# Patient Record
Sex: Female | Born: 1945
Health system: Southern US, Community
[De-identification: ages and names within clinical notes are randomized; demographics above are authoritative.]

## PROBLEM LIST (undated history)

## (undated) DIAGNOSIS — I10 Essential (primary) hypertension: Secondary | ICD-10-CM

## (undated) DIAGNOSIS — I2510 Atherosclerotic heart disease of native coronary artery without angina pectoris: Secondary | ICD-10-CM

---

## 2004-09-26 LAB — CONVERTED CEMR LAB: Pap Smear: NEGATIVE

## 2006-05-20 ENCOUNTER — Encounter: Payer: Self-pay | Admitting: Internal Medicine

## 2006-05-20 LAB — CONVERTED CEMR LAB: Pap Smear: ABNORMAL

## 2009-01-12 ENCOUNTER — Ambulatory Visit: Payer: Self-pay | Admitting: Obstetrics & Gynecology

## 2009-01-24 ENCOUNTER — Encounter: Payer: Self-pay | Admitting: Internal Medicine

## 2009-01-24 DIAGNOSIS — N814 Uterovaginal prolapse, unspecified: Secondary | ICD-10-CM | POA: Insufficient documentation

## 2009-01-24 DIAGNOSIS — R32 Unspecified urinary incontinence: Secondary | ICD-10-CM

## 2009-01-25 ENCOUNTER — Ambulatory Visit: Payer: Self-pay | Admitting: Internal Medicine

## 2009-01-25 DIAGNOSIS — E785 Hyperlipidemia, unspecified: Secondary | ICD-10-CM | POA: Insufficient documentation

## 2009-01-25 DIAGNOSIS — I10 Essential (primary) hypertension: Secondary | ICD-10-CM

## 2009-01-25 DIAGNOSIS — O009 Unspecified ectopic pregnancy without intrauterine pregnancy: Secondary | ICD-10-CM

## 2009-01-25 DIAGNOSIS — F17200 Nicotine dependence, unspecified, uncomplicated: Secondary | ICD-10-CM | POA: Insufficient documentation

## 2009-01-26 LAB — CONVERTED CEMR LAB
BUN: 13 mg/dL (ref 6–23)
CO2: 31 meq/L (ref 19–32)
Calcium: 9.7 mg/dL (ref 8.4–10.5)
GFR calc non Af Amer: 108.55 mL/min (ref >60)
Glucose, Bld: 90 mg/dL (ref 70–99)
HDL: 71.4 mg/dL (ref >39.00)
Sodium: 143 meq/L (ref 135–145)
Triglycerides: 63 mg/dL (ref 0.0–149.0)

## 2009-03-01 ENCOUNTER — Ambulatory Visit: Payer: Self-pay | Admitting: Obstetrics and Gynecology

## 2009-03-06 ENCOUNTER — Encounter: Payer: Self-pay | Admitting: Obstetrics and Gynecology

## 2009-03-06 ENCOUNTER — Inpatient Hospital Stay (HOSPITAL_COMMUNITY): Admission: RE | Admit: 2009-03-06 | Discharge: 2009-03-07 | Payer: Self-pay | Admitting: Obstetrics & Gynecology

## 2009-03-06 ENCOUNTER — Ambulatory Visit: Payer: Self-pay | Admitting: Obstetrics and Gynecology

## 2009-03-08 ENCOUNTER — Ambulatory Visit: Payer: Self-pay | Admitting: Internal Medicine

## 2009-03-22 ENCOUNTER — Ambulatory Visit: Payer: Self-pay | Admitting: Obstetrics and Gynecology

## 2009-06-28 ENCOUNTER — Ambulatory Visit: Payer: Self-pay | Admitting: Obstetrics and Gynecology

## 2009-08-02 ENCOUNTER — Ambulatory Visit: Payer: Self-pay | Admitting: Obstetrics and Gynecology

## 2009-09-01 ENCOUNTER — Ambulatory Visit: Payer: Self-pay | Admitting: Obstetrics & Gynecology

## 2009-11-03 ENCOUNTER — Ambulatory Visit: Payer: Self-pay | Admitting: Obstetrics & Gynecology

## 2009-12-02 ENCOUNTER — Emergency Department (HOSPITAL_BASED_OUTPATIENT_CLINIC_OR_DEPARTMENT_OTHER): Admission: EM | Admit: 2009-12-02 | Discharge: 2009-12-02 | Payer: Self-pay | Admitting: Emergency Medicine

## 2010-01-05 ENCOUNTER — Ambulatory Visit: Payer: Self-pay | Admitting: Obstetrics & Gynecology

## 2010-03-12 ENCOUNTER — Ambulatory Visit: Payer: Self-pay | Admitting: Obstetrics & Gynecology

## 2010-04-09 ENCOUNTER — Ambulatory Visit (HOSPITAL_COMMUNITY)
Admission: RE | Admit: 2010-04-09 | Discharge: 2010-04-09 | Payer: Self-pay | Source: Home / Self Care | Admitting: Obstetrics & Gynecology

## 2010-05-21 ENCOUNTER — Ambulatory Visit: Payer: Self-pay | Admitting: Family Medicine

## 2010-08-23 LAB — CBC
Hemoglobin: 13.3 g/dL (ref 12.0–15.0)
MCHC: 34.3 g/dL (ref 30.0–36.0)
MCHC: 33.7 g/dL (ref 30.0–36.0)
MCV: 97.8 fL (ref 78.0–100.0)
Platelets: 158 10*3/uL (ref 150–400)
RBC: 3.97 MIL/uL (ref 3.87–5.11)
RDW: 12.8 % (ref 11.5–15.5)
RDW: 13.2 % (ref 11.5–15.5)

## 2010-08-23 LAB — TYPE AND SCREEN
ABO/RH(D): B POS
Antibody Screen: NEGATIVE

## 2010-08-23 LAB — BASIC METABOLIC PANEL
CO2: 25 mEq/L (ref 19–32)
Calcium: 9.3 mg/dL (ref 8.4–10.5)
Creatinine, Ser: 0.69 mg/dL (ref 0.4–1.2)
GFR calc Af Amer: >60 mL/min (ref >60)
GFR calc non Af Amer: >60 mL/min (ref >60)
Glucose, Bld: 96 mg/dL (ref 70–99)

## 2010-10-02 NOTE — Group Therapy Note (Signed)
Ann Bernard, Ann Bernard NO.:  [id number]   MEDICAL RECORD NO.:  [id number]          PATIENT TYPE:  WOC   LOCATION:  [facility]                   FACILITY:  [facility]   PHYSICIAN:  Catalina Antigua, MD     DATE OF BIRTH:  09/04/1947   DATE OF SERVICE:  01/12/2009                                  CLINIC NOTE   ADDENDUM   Generally, clear ultrasound.  As stated previously, the patient had  grade 2 uterine prolapse approximately 6 weeks size uterus.  No palpable  adnexal masses or tenderness.           ______________________________  Catalina Antigua, MD     PC/MEDQ  D:  01/12/2009  T:  01/13/2009  Job:  [id number]

## 2010-10-02 NOTE — Group Therapy Note (Signed)
NAMESANJUANA, [name] NO.:  [id number]   MEDICAL RECORD NO.:  [id number]          PATIENT TYPE:  WOC   LOCATION:  [facility]                   FACILITY:  [facility]   PHYSICIAN:  Catalina Antigua, MD     DATE OF BIRTH:  09/04/1947   DATE OF SERVICE:  01/12/2009                                  CLINIC NOTE   This is a 65 year old para 2-0-1-2, menopausal since 1996, presenting  for evaluation of uterine prolapse.  The patient was referred from  [facility].  The patient reports experiencing  prolapse since May, which has gotten progressively worse.  The patient  also reports experiencing incontinence when she coughs or laughs or  exerts pressure secondary to the uterine prolapse.  At times, the  patient needs to return the uterus in to its anatomical position to  allow for urination.  The patient denies any pain or abnormal bleeding  or discharge.  The patient reports occasional pelvic pressure.   PAST MEDICAL HISTORY:  The patient denies any medical problems.   PAST SURGICAL HISTORY:  In 1996, history of an ectopic pregnancy and the  patient reports removal of left tube and left breast biopsy, which was  benign.   FAMILY HISTORY:  Significant for high blood pressure in her parents.  Otherwise, no history of GYN malignancy.   SOCIAL HISTORY:  The patient denies use of illicit drugs or alcohol.  The patient reports smoking half a pack per day for at least 20 years.   ALLERGIES TO MEDICATION:  ASPIRIN.  No latex allergy.   PHYSICAL EXAMINATION:  VITAL SIGNS:  Her weight is 177 pounds, height of  5 feet 7 inches, her blood pressure is 146/94, and pulse is 72.  BREASTS:  Unremarkable.  Both breasts were symmetric in size.  No skin  dimpling.  No abnormal discharge.  No palpable mass.  ABDOMEN:  Soft, nontender, and nondistended.  GENITOURINARY:  No abnormal discharge or bleeding, and stage II prolapse  was appreciated on exam.   The patient  reports history of abnormal Pap smear with her last Pap  being in 2008 and her last mammogram was in July 2009 was found to be  normal.  The patient desires permanent treatment of her uterine  prolapse.  The patient was given the option of conservative treatment  with pessaries, but declines and requesting surgical intervention.  The  patient was counseled regarding a transvaginal hysterectomy and is in  agreement for that.  Risks and benefits of the procedures were reviewed.  The patient verbalized understanding.  In view of an elevated blood  pressure in the  office of 146/94, the patient is to get medical clearance prior to  scheduling of the surgery, the patient is to also obtain records of her  [facility].           ______________________________  Catalina Antigua, MD     PC/MEDQ  D:  01/12/2009  T:  01/13/2009  Job:  [id number]

## 2011-03-14 ENCOUNTER — Other Ambulatory Visit: Payer: Self-pay | Admitting: Obstetrics & Gynecology

## 2011-03-14 DIAGNOSIS — Z1231 Encounter for screening mammogram for malignant neoplasm of breast: Secondary | ICD-10-CM

## 2011-04-12 ENCOUNTER — Ambulatory Visit (HOSPITAL_BASED_OUTPATIENT_CLINIC_OR_DEPARTMENT_OTHER): Payer: Self-pay

## 2012-06-03 ENCOUNTER — Encounter: Payer: Self-pay | Admitting: *Deleted

## 2015-05-07 ENCOUNTER — Encounter (HOSPITAL_BASED_OUTPATIENT_CLINIC_OR_DEPARTMENT_OTHER): Payer: Self-pay | Admitting: Emergency Medicine

## 2015-05-07 ENCOUNTER — Emergency Department (HOSPITAL_BASED_OUTPATIENT_CLINIC_OR_DEPARTMENT_OTHER)
Admission: EM | Admit: 2015-05-07 | Discharge: 2015-05-07 | Disposition: A | Discharge status: Home / Self Care | Payer: Medicare HMO | Attending: Emergency Medicine | Admitting: Emergency Medicine

## 2015-05-07 ENCOUNTER — Emergency Department (HOSPITAL_BASED_OUTPATIENT_CLINIC_OR_DEPARTMENT_OTHER): Payer: Medicare HMO

## 2015-05-07 DIAGNOSIS — R079 Chest pain, unspecified: Secondary | ICD-10-CM | POA: Diagnosis present

## 2015-05-07 DIAGNOSIS — I2510 Atherosclerotic heart disease of native coronary artery without angina pectoris: Secondary | ICD-10-CM | POA: Diagnosis not present

## 2015-05-07 DIAGNOSIS — K219 Gastro-esophageal reflux disease without esophagitis: Secondary | ICD-10-CM | POA: Insufficient documentation

## 2015-05-07 DIAGNOSIS — Z79899 Other long term (current) drug therapy: Secondary | ICD-10-CM | POA: Insufficient documentation

## 2015-05-07 DIAGNOSIS — I10 Essential (primary) hypertension: Secondary | ICD-10-CM | POA: Insufficient documentation

## 2015-05-07 HISTORY — DX: Atherosclerotic heart disease of native coronary artery without angina pectoris: I25.10

## 2015-05-07 HISTORY — DX: Essential (primary) hypertension: I10

## 2015-05-07 LAB — CBC WITH DIFFERENTIAL/PLATELET
BASOS PCT: 0 %
Basophils Absolute: 0 10*3/uL (ref 0.0–0.1)
EOS ABS: 0.1 10*3/uL (ref 0.0–0.7)
Eosinophils Relative: 2 %
HCT: 44 % (ref 36.0–46.0)
HEMOGLOBIN: 14.6 g/dL (ref 12.0–15.0)
Lymphocytes Relative: 33 %
Lymphs Abs: 2.3 10*3/uL (ref 0.7–4.0)
MCH: 31.4 pg (ref 26.0–34.0)
MCHC: 33.2 g/dL (ref 30.0–36.0)
MCV: 94.6 fL (ref 78.0–100.0)
MONOS PCT: 9 %
Monocytes Absolute: 0.6 10*3/uL (ref 0.1–1.0)
NEUTROS PCT: 56 %
Neutro Abs: 3.8 10*3/uL (ref 1.7–7.7)
PLATELETS: 159 10*3/uL (ref 150–400)
RBC: 4.65 MIL/uL (ref 3.87–5.11)
RDW: 12.8 % (ref 11.5–15.5)
WBC: 6.8 10*3/uL (ref 4.0–10.5)

## 2015-05-07 LAB — BASIC METABOLIC PANEL
Anion gap: 6 (ref 5–15)
BUN: 13 mg/dL (ref 6–20)
CALCIUM: 9.1 mg/dL (ref 8.9–10.3)
CO2: 27 mmol/L (ref 22–32)
CREATININE: 0.62 mg/dL (ref 0.44–1.00)
Chloride: 107 mmol/L (ref 101–111)
GFR calc non Af Amer: >60 mL/min (ref >60)
Glucose, Bld: 108 mg/dL — ABNORMAL HIGH (ref 65–99)
Potassium: 3.7 mmol/L (ref 3.5–5.1)
SODIUM: 140 mmol/L (ref 135–145)

## 2015-05-07 LAB — TROPONIN I

## 2015-05-07 MED ORDER — RANITIDINE HCL 150 MG PO CAPS: 150.0000 mg | ORAL_CAPSULE | Freq: Two times a day (BID) | ORAL | Status: DC

## 2015-05-07 NOTE — Discharge Instructions (Signed)
Gastroesophageal Reflux Disease, Adult Normally, food travels down the esophagus and stays in the stomach to be digested. If a person has gastroesophageal reflux disease (GERD), food and stomach acid move back up into the esophagus. When this happens, the esophagus becomes sore and swollen (inflamed). Over time, GERD can make small holes (ulcers) in the lining of the esophagus. HOME CARE Diet  Follow a diet as told by your doctor. You may need to avoid foods and drinks such as:  Coffee and tea (with or without caffeine).  Drinks that contain alcohol.  Energy drinks and sports drinks.  Carbonated drinks or sodas.  Chocolate and cocoa.  Peppermint and mint flavorings.  Garlic and onions.  Horseradish.  Spicy and acidic foods, such as peppers, chili powder, curry powder, vinegar, hot sauces, and BBQ sauce.  Citrus fruit juices and citrus fruits, such as oranges, lemons, and limes.  Tomato-based foods, such as red sauce, chili, salsa, and pizza with red sauce.  Fried and fatty foods, such as donuts, french fries, potato chips, and high-fat dressings.  High-fat meats, such as hot dogs, rib eye steak, sausage, ham, and bacon.  High-fat dairy items, such as whole milk, butter, and cream cheese.  Eat small meals often. Avoid eating large meals.  Avoid drinking large amounts of liquid with your meals.  Avoid eating meals during the 2-3 hours before bedtime.  Avoid lying down right after you eat.  Do not exercise right after you eat. General Instructions  Pay attention to any changes in your symptoms.  Take over-the-counter and prescription medicines only as told by your doctor. Do not take aspirin, ibuprofen, or other NSAIDs unless your doctor says it is okay.  Do not use any tobacco products, including cigarettes, chewing tobacco, and e-cigarettes. If you need help quitting, ask your doctor.  Wear loose clothes. Do not wear anything tight around your waist.  Raise  (elevate) the head of your bed about 6 inches (15 cm).  Try to lower your stress. If you need help doing this, ask your doctor.  If you are overweight, lose an amount of weight that is healthy for you. Ask your doctor about a safe weight loss goal.  Keep all follow-up visits as told by your doctor. This is important. GET HELP IF:  You have new symptoms.  You lose weight and you do not know why it is happening.  You have trouble swallowing, or it hurts to swallow.  You have wheezing or a cough that keeps happening.  Your symptoms do not get better with treatment.  You have a hoarse voice. GET HELP RIGHT AWAY IF:  You have pain in your arms, neck, jaw, teeth, or back.  You feel sweaty, dizzy, or light-headed.  You have chest pain or shortness of breath.  You throw up (vomit) and your throw up looks like blood or coffee grounds.  You pass out (faint).  Your poop (stool) is bloody or black.  You cannot swallow, drink, or eat.   This information is not intended to replace advice given to you by your health care provider. Make sure you discuss any questions you have with your health care provider.   Document Released: 10/23/2007 Document Revised: 01/25/2015 Document Reviewed: 08/31/2014 Elsevier Interactive Patient Education 2016 [employer]  -   

## 2015-05-07 NOTE — ED Notes (Signed)
Reports feeling like she had indigestion yesterday am, states that it felt like something was stuck in her throat, this am feeling had resoled, denies n/v, sob or radiation of pain

## 2015-05-07 NOTE — ED Provider Notes (Signed)
CSN: [id number]     Arrival date & time 05/07/15  0902 History   First MD Initiated Contact with Patient 05/07/15 0911     Chief Complaint  Patient presents with  . Chest Pain      HPI Patient presents with epigastric discomfort which started yesterday.  She took antacids and woke up this morning with discomfort gone.  She has no discomfort now.  She has no cardiac history.  Denies diaphoresis, nausea, vomiting.  Has history of hypertension. Past Medical History  Diagnosis Date  . Hypertension   . Coronary artery disease    History reviewed. No pertinent past surgical history. History reviewed. No pertinent family history. Social History  Substance Use Topics  . Smoking status: Never Smoker   . Smokeless tobacco: None  . Alcohol Use: No   OB History    No data available     Review of Systems   All other systems reviewed and are negative Allergies  Aspirin  Home Medications   Prior to Admission medications   Medication Sig Start Date End Date Taking? Authorizing Provider  losartan (COZAAR) 100 MG tablet Take 100 mg by mouth daily.   Yes Historical Provider, MD  pravastatin (PRAVACHOL) 40 MG tablet Take 40 mg by mouth daily.   Yes Historical Provider, MD  ranitidine (ZANTAC) 150 MG capsule Take 1 capsule (150 mg total) by mouth 2 (two) times daily. 05/07/15   Nelva Nay, MD   BP 135/97 mmHg  Pulse 64  Temp(Src) 98.2 F (36.8 C) (Oral)  Resp 11  Wt 192 lb (87.091 kg)  SpO2 100% Physical Exam Physical Exam  Nursing note and vitals reviewed. Constitutional: She is oriented to person, place, and time. She appears well-developed and well-nourished. No distress.  HENT:  Head: Normocephalic and atraumatic.  Eyes: Pupils are equal, round, and reactive to light.  Neck: Normal range of motion.  Cardiovascular: Normal rate and intact distal pulses.   Pulmonary/Chest: No respiratory distress.  Abdominal: Normal appearance. She exhibits no distension.   Musculoskeletal: Normal range of motion.  Neurological: She is alert and oriented to person, place, and time. No cranial nerve deficit.  Skin: Skin is warm and dry. No rash noted.     ED Course  Procedures (including critical care time) Labs Review Labs Reviewed  BASIC METABOLIC PANEL - Abnormal; Notable for the following:    Glucose, Bld 108 (*)    All other components within normal limits  CBC WITH DIFFERENTIAL/PLATELET  TROPONIN I    Imaging Review Dg Chest Portable 1 View  05/07/2015  CLINICAL DATA:  Chest pain EXAM: PORTABLE CHEST 1 VIEW COMPARISON:  None. FINDINGS: Normal heart size. Clear lungs. No pneumothorax or pleural effusion. IMPRESSION: No active disease. Electronically Signed   By: Jolaine Click M.D.   On: 05/07/2015 09:56   I have personally reviewed and evaluated these images and lab results as part of my medical decision-making.   EKG Interpretation   Date/Time:  Sunday May 07 2015 09:12:51 EST Ventricular Rate:  72 PR Interval:  150 QRS Duration: 90 QT Interval:  383 QTC Calculation: 419 R Axis:   11 Text Interpretation:  Sinus rhythm Abnormal R-wave progression, early  transition No previous tracing Confirmed by Cuma Polyakov  MD, Frederich Montilla ([id number]) on  05/07/2015 9:17:56 AM      MDM   Final diagnoses:  Gastroesophageal reflux disease, esophagitis presence not specified        Nelva Nay, MD 05/07/15 1103

## 2019-03-14 ENCOUNTER — Emergency Department (HOSPITAL_BASED_OUTPATIENT_CLINIC_OR_DEPARTMENT_OTHER): Payer: Medicare HMO

## 2019-03-14 ENCOUNTER — Inpatient Hospital Stay (HOSPITAL_BASED_OUTPATIENT_CLINIC_OR_DEPARTMENT_OTHER)
Admission: EM | Admit: 2019-03-14 | Discharge: 2019-03-18 | DRG: 286 | Disposition: A | Discharge status: Home / Self Care | Payer: Medicare HMO | Attending: Internal Medicine | Admitting: Internal Medicine

## 2019-03-14 ENCOUNTER — Other Ambulatory Visit: Payer: Self-pay

## 2019-03-14 DIAGNOSIS — Z888 Allergy status to other drugs, medicaments and biological substances status: Secondary | ICD-10-CM | POA: Diagnosis not present

## 2019-03-14 DIAGNOSIS — E876 Hypokalemia: Secondary | ICD-10-CM | POA: Diagnosis present

## 2019-03-14 DIAGNOSIS — K219 Gastro-esophageal reflux disease without esophagitis: Secondary | ICD-10-CM | POA: Diagnosis present

## 2019-03-14 DIAGNOSIS — I110 Hypertensive heart disease with heart failure: Secondary | ICD-10-CM | POA: Diagnosis present

## 2019-03-14 DIAGNOSIS — I2510 Atherosclerotic heart disease of native coronary artery without angina pectoris: Secondary | ICD-10-CM | POA: Diagnosis present

## 2019-03-14 DIAGNOSIS — I10 Essential (primary) hypertension: Secondary | ICD-10-CM | POA: Diagnosis present

## 2019-03-14 DIAGNOSIS — J9601 Acute respiratory failure with hypoxia: Secondary | ICD-10-CM | POA: Diagnosis not present

## 2019-03-14 DIAGNOSIS — I361 Nonrheumatic tricuspid (valve) insufficiency: Secondary | ICD-10-CM | POA: Diagnosis not present

## 2019-03-14 DIAGNOSIS — Z79899 Other long term (current) drug therapy: Secondary | ICD-10-CM | POA: Diagnosis not present

## 2019-03-14 DIAGNOSIS — E785 Hyperlipidemia, unspecified: Secondary | ICD-10-CM | POA: Diagnosis present

## 2019-03-14 DIAGNOSIS — Z833 Family history of diabetes mellitus: Secondary | ICD-10-CM | POA: Diagnosis not present

## 2019-03-14 DIAGNOSIS — Z23 Encounter for immunization: Secondary | ICD-10-CM

## 2019-03-14 DIAGNOSIS — Z8249 Family history of ischemic heart disease and other diseases of the circulatory system: Secondary | ICD-10-CM

## 2019-03-14 DIAGNOSIS — Z811 Family history of alcohol abuse and dependence: Secondary | ICD-10-CM | POA: Diagnosis not present

## 2019-03-14 DIAGNOSIS — Z20828 Contact with and (suspected) exposure to other viral communicable diseases: Secondary | ICD-10-CM | POA: Diagnosis present

## 2019-03-14 DIAGNOSIS — I5021 Acute systolic (congestive) heart failure: Secondary | ICD-10-CM

## 2019-03-14 DIAGNOSIS — I509 Heart failure, unspecified: Secondary | ICD-10-CM

## 2019-03-14 DIAGNOSIS — I428 Other cardiomyopathies: Secondary | ICD-10-CM | POA: Diagnosis present

## 2019-03-14 DIAGNOSIS — I272 Pulmonary hypertension, unspecified: Secondary | ICD-10-CM | POA: Diagnosis present

## 2019-03-14 DIAGNOSIS — I249 Acute ischemic heart disease, unspecified: Secondary | ICD-10-CM | POA: Diagnosis present

## 2019-03-14 DIAGNOSIS — R072 Precordial pain: Secondary | ICD-10-CM

## 2019-03-14 DIAGNOSIS — F17210 Nicotine dependence, cigarettes, uncomplicated: Secondary | ICD-10-CM | POA: Diagnosis present

## 2019-03-14 DIAGNOSIS — I340 Nonrheumatic mitral (valve) insufficiency: Secondary | ICD-10-CM | POA: Diagnosis not present

## 2019-03-14 DIAGNOSIS — I447 Left bundle-branch block, unspecified: Secondary | ICD-10-CM | POA: Diagnosis present

## 2019-03-14 LAB — COMPREHENSIVE METABOLIC PANEL
ALT: 38 U/L (ref 0–44)
AST: 24 U/L (ref 15–41)
Albumin: 4.1 g/dL (ref 3.5–5.0)
Alkaline Phosphatase: 59 U/L (ref 38–126)
Anion gap: 10 (ref 5–15)
BUN: 16 mg/dL (ref 8–23)
CO2: 23 mmol/L (ref 22–32)
Calcium: 9.3 mg/dL (ref 8.9–10.3)
Chloride: 109 mmol/L (ref 98–111)
Creatinine, Ser: 0.86 mg/dL (ref 0.44–1.00)
GFR calc Af Amer: >60 mL/min (ref >60)
GFR calc non Af Amer: >60 mL/min (ref >60)
Glucose, Bld: 124 mg/dL — ABNORMAL HIGH (ref 70–99)
Potassium: 3.7 mmol/L (ref 3.5–5.1)
Sodium: 142 mmol/L (ref 135–145)
Total Bilirubin: 0.8 mg/dL (ref 0.3–1.2)
Total Protein: 7.3 g/dL (ref 6.5–8.1)

## 2019-03-14 LAB — CBC WITH DIFFERENTIAL/PLATELET
Abs Immature Granulocytes: 0.02 10*3/uL (ref 0.00–0.07)
Basophils Absolute: 0 10*3/uL (ref 0.0–0.1)
Basophils Relative: 1 %
Eosinophils Absolute: 0.1 10*3/uL (ref 0.0–0.5)
Eosinophils Relative: 1 %
HCT: 51 % — ABNORMAL HIGH (ref 36.0–46.0)
Hemoglobin: 15.9 g/dL — ABNORMAL HIGH (ref 12.0–15.0)
Immature Granulocytes: 0 %
Lymphocytes Relative: 39 %
Lymphs Abs: 2.8 10*3/uL (ref 0.7–4.0)
MCH: 31.6 pg (ref 26.0–34.0)
MCHC: 31.2 g/dL (ref 30.0–36.0)
MCV: 101.4 fL — ABNORMAL HIGH (ref 80.0–100.0)
Monocytes Absolute: 0.6 10*3/uL (ref 0.1–1.0)
Monocytes Relative: 8 %
Neutro Abs: 3.7 10*3/uL (ref 1.7–7.7)
Neutrophils Relative %: 51 %
Platelets: 207 10*3/uL (ref 150–400)
RBC: 5.03 MIL/uL (ref 3.87–5.11)
RDW: 13.5 % (ref 11.5–15.5)
WBC: 7.2 10*3/uL (ref 4.0–10.5)
nRBC: 0 % (ref 0.0–0.2)

## 2019-03-14 LAB — SARS CORONAVIRUS 2 BY RT PCR (HOSPITAL ORDER, PERFORMED IN ~~LOC~~ HOSPITAL LAB): SARS Coronavirus 2: NEGATIVE

## 2019-03-14 LAB — D-DIMER, QUANTITATIVE: D-Dimer, Quant: 2.03 ug/mL-FEU — ABNORMAL HIGH (ref 0.00–0.50)

## 2019-03-14 LAB — TROPONIN I (HIGH SENSITIVITY)
Troponin I (High Sensitivity): 22 ng/L — ABNORMAL HIGH (ref <18)
Troponin I (High Sensitivity): 60 ng/L — ABNORMAL HIGH (ref <18)
Troponin I (High Sensitivity): 493 ng/L (ref <18)

## 2019-03-14 LAB — BRAIN NATRIURETIC PEPTIDE: B Natriuretic Peptide: 1035.7 pg/mL — ABNORMAL HIGH (ref 0.0–100.0)

## 2019-03-14 MED ORDER — SODIUM CHLORIDE 0.9 % IV SOLN
2.0000 g | INTRAVENOUS | Status: DC
  Administered 2019-03-14: 2 g via INTRAVENOUS
  Filled 2019-03-14 (×2): qty 20

## 2019-03-14 MED ORDER — HEPARIN BOLUS VIA INFUSION
6000.0000 [IU] | Freq: Once | INTRAVENOUS | Status: AC
  Administered 2019-03-14: 6000 [IU] via INTRAVENOUS

## 2019-03-14 MED ORDER — SODIUM CHLORIDE 0.9 % IV SOLN
INTRAVENOUS | Status: DC | PRN
  Administered 2019-03-14: 500 mL via INTRAVENOUS

## 2019-03-14 MED ORDER — FUROSEMIDE 10 MG/ML IJ SOLN
40.0000 mg | Freq: Once | INTRAMUSCULAR | Status: AC
  Administered 2019-03-14: 40 mg via INTRAVENOUS
  Filled 2019-03-14: qty 4

## 2019-03-14 MED ORDER — SODIUM CHLORIDE 0.9 % IV SOLN
500.0000 mg | INTRAVENOUS | Status: DC
  Administered 2019-03-14: 500 mg via INTRAVENOUS
  Filled 2019-03-14 (×2): qty 500

## 2019-03-14 MED ORDER — IOHEXOL 350 MG/ML SOLN
100.0000 mL | Freq: Once | INTRAVENOUS | Status: AC | PRN
  Administered 2019-03-14: 81 mL via INTRAVENOUS

## 2019-03-14 MED ORDER — ASPIRIN 81 MG PO CHEW
324.0000 mg | CHEWABLE_TABLET | Freq: Once | ORAL | Status: AC
  Administered 2019-03-14: 324 mg via ORAL
  Filled 2019-03-14: qty 4

## 2019-03-14 MED ORDER — HEPARIN (PORCINE) 25000 UT/250ML-% IV SOLN
1400.0000 [IU]/h | INTRAVENOUS | Status: DC
  Administered 2019-03-14: 1400 [IU]/h via INTRAVENOUS
  Filled 2019-03-14: qty 250

## 2019-03-14 NOTE — ED Notes (Signed)
Portable Xray at bedside.

## 2019-03-14 NOTE — ED Notes (Signed)
Lasix given and purewick placed. Pt repositioned to sit upright. Fan given for comfort

## 2019-03-14 NOTE — ED Notes (Signed)
ED Provider at bedside. 

## 2019-03-14 NOTE — ED Notes (Signed)
Patient transported to CT 

## 2019-03-14 NOTE — ED Notes (Signed)
CT delayed due to pt's severe dyspnea at rest. Long, MD and Evelena Peat, rad tech aware

## 2019-03-14 NOTE — Progress Notes (Signed)
ANTICOAGULATION CONSULT NOTE - Initial Consult  Pharmacy Consult for Heparin Indication: pulmonary embolus  Allergies  Allergen Reactions  . Aspirin Nausea Only    Other reaction(s): GI Upset (intolerance) Stomach cramps Stomach cramps     Patient Measurements: Height: 5\' 7"  (170.2 cm) Weight: 199 lb 15.3 oz (90.7 kg) IBW/kg (Calculated) : 61.6 Heparin Dosing Weight: 90.7 kg  Vital Signs: Temp: 98.1 F (36.7 C) (10/25 1604) Temp Source: Oral (10/25 1604) BP: 142/113 (10/25 1730) Pulse Rate: 128 (10/25 1730)  Labs: Recent Labs    03/14/19 1600  HGB 15.9*  HCT 51.0*  PLT 207  CREATININE 0.86  TROPONINIHS 22*    Estimated Creatinine Clearance: 67.3 mL/min (by C-G formula based on SCr of 0.86 mg/dL).   Medical History: Past Medical History:  Diagnosis Date  . Coronary artery disease   . Hypertension     Medications:  Infusions:  . sodium chloride 500 mL (03/14/19 1754)  . heparin 1,400 Units/hr (03/14/19 1755)    Assessment: Patient is a 36 yof with a hx of CHF whom presents to the ED with chest pain and shortness of breath. Patient is having a COPD exacerbation currently preventing her from getting a CT-PE at this time. MD wished to start heparin empirically.   Goal of Therapy:  Heparin level 0.3-0.7 units/ml Monitor platelets by anticoagulation protocol: Yes   Plan:  - Will bolus the patient with Heparin 6000 units IV x 1 dose  - Followed by Heparin 14000 units/hr  - Will check a Heparin level in 8 hours from start of infusion  - Will monitor for s/s of bleeding  Duanne Limerick PharmD. BCPS 03/14/2019,6:10 PM

## 2019-03-14 NOTE — ED Notes (Signed)
CT notified pt ready to go for scan

## 2019-03-14 NOTE — ED Notes (Signed)
Pt unable to void after lasix administered. VORB from Dr. Laverta Baltimore to I/o cath if necessary and pt requested it

## 2019-03-14 NOTE — Progress Notes (Signed)
Pt taken off BIPAP. Placed on 2LT [place]. Pt not in any distress at this time.

## 2019-03-14 NOTE — ED Notes (Signed)
Dr. Laverta Baltimore aware that pt's troponin is 493.

## 2019-03-14 NOTE — ED Provider Notes (Signed)
Emergency Department Provider Note   I have reviewed the triage vital signs and the nursing notes.   HISTORY  Chief Complaint Chest Pain   HPI Ann Bernard is a 73 y.o. female with past medical history of hypertension presents to the emergency department for evaluation of exertional chest pain now with some mild discomfort at rest along with generalized weakness, nausea, diaphoresis, shortness of breath.  Symptoms been worsening over the past week.  Patient states that initially symptoms were worse with getting up and walking around.  She have to stop and rest and symptoms would resolve.  Over the past several days that pattern has continued but she does have some mild chest discomfort at rest now as well.  She denies fevers, chills, sore throat.  Denies cough.  No sick contact.  No abdominal or back pain.  She is compliant with her medications.  CAD is listed in her medical history here but she denies prior heart attacks.  She denies any active pain or shortness of breath resting here in the emergency department.     Past Medical History:  Diagnosis Date  . Coronary artery disease   . Hypertension     Patient Active Problem List   Diagnosis Date Noted  . DYSLIPIDEMIA 01/25/2009  . TOBACCO ABUSE 01/25/2009  . HYPERTENSION 01/25/2009  . ECTOPIC PREGNANCY 01/25/2009  . UTERINE PROLAPSE 01/24/2009  . URINARY INCONTINENCE 01/24/2009    No past surgical history on file.  Allergies Aspirin  No family history on file.  Social History Social History   Tobacco Use  . Smoking status: Never Smoker  Substance Use Topics  . Alcohol use: No  . Drug use: Not on file    Review of Systems  Constitutional: No fever/chills. Positive generalized weakness.  Eyes: No visual changes. ENT: No sore throat. Cardiovascular: Positive chest pain. Respiratory: Positive shortness of breath. Gastrointestinal: No abdominal pain. Positive nausea, no vomiting.  No diarrhea.  No  constipation. Genitourinary: Negative for dysuria. Musculoskeletal: Negative for back pain. Skin: Negative for rash. Neurological: Negative for headaches, focal weakness or numbness.  10-point ROS otherwise negative.  ____________________________________________   PHYSICAL EXAM:  VITAL SIGNS: ED Triage Vitals  Enc Vitals Group     BP 03/14/19 1604 (!) 140/99     Pulse Rate 03/14/19 1604 (!) 118     Resp 03/14/19 1604 (!) 37     Temp 03/14/19 1604 98.1 F (36.7 C)     Temp Source 03/14/19 1604 Oral     SpO2 03/14/19 1604 98 %     Weight --      Height 03/14/19 1601 5\' 7"  (1.702 m)   Constitutional: Alert and oriented. Well appearing but diaphoretic on my initial evaluation.  Eyes: Conjunctivae are normal.  Head: Atraumatic. Nose: No congestion/rhinnorhea. Mouth/Throat: Mucous membranes are moist.  Neck: No stridor.   Cardiovascular: Tachycardia. Good peripheral circulation. Grossly normal heart sounds.   Respiratory: Slight increased respiratory effort.  No retractions. Lungs CTAB. Gastrointestinal: Soft and nontender. No distention.  Musculoskeletal: No lower extremity tenderness nor edema. No gross deformities of extremities. Neurologic:  Normal speech and language.  Skin:  Skin is warm, dry and intact. No rash noted.   ____________________________________________   LABS (all labs ordered are listed, but only abnormal results are displayed)  Labs Reviewed  COMPREHENSIVE METABOLIC PANEL - Abnormal; Notable for the following components:      Result Value   Glucose, Bld 124 (*)    All other components within  normal limits  BRAIN NATRIURETIC PEPTIDE - Abnormal; Notable for the following components:   B Natriuretic Peptide 1,035.7 (*)    All other components within normal limits  CBC WITH DIFFERENTIAL/PLATELET - Abnormal; Notable for the following components:   Hemoglobin 15.9 (*)    HCT 51.0 (*)    MCV 101.4 (*)    All other components within normal limits   D-DIMER, QUANTITATIVE (NOT AT [facility]) - Abnormal; Notable for the following components:   D-Dimer, Quant 2.03 (*)    All other components within normal limits  TROPONIN I (HIGH SENSITIVITY) - Abnormal; Notable for the following components:   Troponin I (High Sensitivity) 22 (*)    All other components within normal limits  SARS CORONAVIRUS 2 BY RT PCR (HOSPITAL ORDER, [place] [facility])  HEPARIN LEVEL (UNFRACTIONATED)  TROPONIN I (HIGH SENSITIVITY)   ____________________________________________  EKG   EKG Interpretation  Date/Time:  Sunday March 14 2019 16:02:29 EDT Ventricular Rate:  126 PR Interval:    QRS Duration: 149 QT Interval:  360 QTC Calculation: 522 R Axis:   60 Text Interpretation:  Fast sinus arrhythmia Left bundle branch block No STEMI Confirmed by Nanda Quinton [phone]) on 03/14/2019 4:04:33 PM       ____________________________________________  RADIOLOGY  Dg Chest Portable 1 View  Result Date: 03/14/2019 CLINICAL DATA:  Chest pain, shortness of breath EXAM: PORTABLE CHEST 1 VIEW COMPARISON:  05/07/2015 FINDINGS: Bilateral diffuse interstitial thickening. No pleural effusion or pneumothorax. Stable cardiomegaly. No acute osseous abnormality. IMPRESSION: Bilateral interstitial thickening and cardiomegaly most concerning for mild CHF versus interstitial infection. Electronically Signed   By: Kathreen Devoid   On: 03/14/2019 16:45    ____________________________________________   PROCEDURES  Procedure(s) performed:   .Critical Care Performed by: Margette Fast, MD Authorized by: Margette Fast, MD   Critical care provider statement:    Critical care time (minutes):  75   Critical care time was exclusive of:  Separately billable procedures and treating other patients and teaching time   Critical care was necessary to treat or prevent imminent or life-threatening deterioration of the following conditions:  Respiratory failure   Critical  care was time spent personally by me on the following activities:  Blood draw for specimens, development of treatment plan with patient or surrogate, discussions with consultants, evaluation of patient's response to treatment, examination of patient, obtaining history from patient or surrogate, ordering and performing treatments and interventions, ordering and review of laboratory studies, ordering and review of radiographic studies, pulse oximetry, re-evaluation of patient's condition and review of old charts   I assumed direction of critical care for this patient from another provider in my specialty: no       ____________________________________________   INITIAL IMPRESSION / [place] / ED COURSE  Pertinent labs & imaging results that were available during my care of the patient were reviewed by me and considered in my medical decision making (see chart for details).   Patient presents to the emergency department for evaluation of chest pain, shortness of breath, weakness, diaphoresis, nausea.  Symptoms worse with exertion but now occasional symptoms at rest.  She is tachycardic here and diaphoretic on my initial evaluation.  She denies chest pain symptoms.  Will give aspirin.  Patient has listed allergy but states it gives her some stomach upset when she does not take a baby aspirin.  No fever or clinical concern for COVID-19.  Plan for screening lab work including D-dimer with  her shortness of breath and tachycardia.  Left bundle branch block on EKG is new from our last tracing but in review of care everywhere there is an EKG from 07/22/2018 where sinus rhythm and left bundle branch block is described. I cannot visually inspect the tracing, however.   05:00 PM  Reevaluated the patient and she continues to have tachypnea which does seem somewhat worse than when she arrived.  Her lab work shows significantly elevated BNP along with slightly elevated troponin and elevated D-dimer.  Patient  would likely benefit from BiPAP and given her week of progressive worsening symptoms with no fevers I do suspect that this may be some new CHF presentation.  I am sending rapid Covid testing.  Given the elevated D-dimer I do have some concern for PE but I do not feel the patient is stable enough at this time to lie flat for CT of the chest.  Will start heparin.  Will give Lasix.   06:50 PM  COVID negative.  Patient is on BiPAP and doing significantly better.  Heart rate and respiratory rate have decreased.  Will perform CTA when patient is more stable and able to lie flat which I still do not believe is right now.  She is receiving heparin.  Continue trending her troponins.  Will call for bed placement.   07:15 PM  Discussed patient's case with [facility], Dr. Antionette Char to request admission. Patient and family (if present) updated with plan.   I reviewed all nursing notes, vitals, pertinent old records, EKGs, labs, imaging (as available).  09:00 PM  CTA reviewed which shows no PE.  Patient has suspicion for infectious process but feel that edema is much more likely.  Given the somewhat early nature of his presentation I do plan for antibiotics which can be drawn back and discontinued by the primary team of the patient continues to improve.  She was able to remove the BiPAP to go to CT and would like to keep it off for now.  Will follow very closely.  Patient allowed for ice chips.  Troponin has increased now to 60 but doubt primary ACS clinically.  Feel that given the patient's level of respiratory distress and edema that that is likely related to strain.   11:00 PM  Continue to follow troponin. No CP or significant SOB at this time. EKG unchanged. Doubt NSTEMI but will continue heparin for now while trending troponin. Patient remains on BiPAP but very comfortable.  ____________________________________________  FINAL CLINICAL IMPRESSION(S) / ED DIAGNOSES  Final diagnoses:  Acute respiratory failure with  hypoxia (HCC)  Precordial chest pain     MEDICATIONS GIVEN DURING THIS VISIT:  Medications  iohexol (OMNIPAQUE) 350 MG/ML injection 100 mL (has no administration in time range)  heparin ADULT infusion 100 units/mL (25000 units/260mL sodium chloride 0.45%) (1,400 Units/hr Intravenous Rate/Dose Verify 03/14/19 1815)  0.9 %  sodium chloride infusion (500 mLs Intravenous New Bag/Given 03/14/19 1754)  aspirin chewable tablet 324 mg (324 mg Oral Given 03/14/19 1653)  furosemide (LASIX) injection 40 mg (40 mg Intravenous Given 03/14/19 1710)  heparin bolus via infusion 6,000 Units (6,000 Units Intravenous Bolus from Bag 03/14/19 1756)    Note:  This document was prepared using Dragon voice recognition software and may include unintentional dictation errors.  Alona Bene, MD, [facility]    Koray Soter, Arlyss Repress, MD 03/15/19 [phone]

## 2019-03-14 NOTE — ED Notes (Signed)
Patient has become more SOB. Placed on 10L [place] [facility]. May need BiPAP after Covid test. Will continue to monitor.

## 2019-03-14 NOTE — ED Notes (Signed)
Son Beverely Low going home for the night, with plans to return in the morning. Phone number in demographics. Updated on wait for bed assignment.

## 2019-03-14 NOTE — ED Notes (Signed)
Pt to CT with RN. Tol well. Able to stand and move from stretcher to CT table easily. Ice chips given upon return to room 8 with verbal approval from Dr. Laverta Baltimore

## 2019-03-14 NOTE — ED Notes (Signed)
Pt having difficulty using purewick. Removed and used bed pan with more success.

## 2019-03-14 NOTE — ED Triage Notes (Signed)
Pt c/o mid sternal CP with sob and nausea intermittently since 10/15, however pt reports worsening since last night

## 2019-03-15 ENCOUNTER — Encounter (HOSPITAL_COMMUNITY): Payer: Self-pay

## 2019-03-15 DIAGNOSIS — I249 Acute ischemic heart disease, unspecified: Secondary | ICD-10-CM | POA: Diagnosis present

## 2019-03-15 DIAGNOSIS — I509 Heart failure, unspecified: Secondary | ICD-10-CM

## 2019-03-15 DIAGNOSIS — R072 Precordial pain: Secondary | ICD-10-CM

## 2019-03-15 DIAGNOSIS — J9601 Acute respiratory failure with hypoxia: Secondary | ICD-10-CM

## 2019-03-15 LAB — HEPARIN LEVEL (UNFRACTIONATED)
Heparin Unfractionated: 1.26 IU/mL — ABNORMAL HIGH (ref 0.30–0.70)
Heparin Unfractionated: 0.71 IU/mL — ABNORMAL HIGH (ref 0.30–0.70)

## 2019-03-15 LAB — TROPONIN I (HIGH SENSITIVITY): Troponin I (High Sensitivity): 478 ng/L (ref <18)

## 2019-03-15 LAB — MRSA PCR SCREENING: MRSA by PCR: NEGATIVE

## 2019-03-15 MED ORDER — ONDANSETRON HCL 4 MG/2ML IJ SOLN: 4.0000 mg | Freq: Four times a day (QID) | INTRAMUSCULAR | Status: DC | PRN

## 2019-03-15 MED ORDER — SODIUM CHLORIDE 0.9% FLUSH: 3.0000 mL | INTRAVENOUS | Status: DC | PRN

## 2019-03-15 MED ORDER — FUROSEMIDE 10 MG/ML IJ SOLN
40.0000 mg | Freq: Two times a day (BID) | INTRAMUSCULAR | Status: DC
  Administered 2019-03-15: 40 mg via INTRAVENOUS
  Filled 2019-03-15: qty 4

## 2019-03-15 MED ORDER — SODIUM CHLORIDE 0.9% FLUSH
3.0000 mL | Freq: Two times a day (BID) | INTRAVENOUS | Status: DC
  Administered 2019-03-15 – 2019-03-17 (×4): 3 mL via INTRAVENOUS

## 2019-03-15 MED ORDER — ZOLPIDEM TARTRATE 5 MG PO TABS: 5.0000 mg | ORAL_TABLET | Freq: Every evening | ORAL | Status: DC | PRN

## 2019-03-15 MED ORDER — SODIUM CHLORIDE 0.9 % IV SOLN: 250.0000 mL | INTRAVENOUS | Status: DC | PRN

## 2019-03-15 MED ORDER — NITROGLYCERIN 0.2 MG/HR TD PT24
0.2000 mg | MEDICATED_PATCH | TRANSDERMAL | Status: DC
  Administered 2019-03-15: 0.2 mg via TRANSDERMAL
  Filled 2019-03-15: qty 1

## 2019-03-15 MED ORDER — ONDANSETRON HCL 4 MG PO TABS: 4.0000 mg | ORAL_TABLET | Freq: Four times a day (QID) | ORAL | Status: DC | PRN

## 2019-03-15 MED ORDER — LOSARTAN POTASSIUM 50 MG PO TABS
100.0000 mg | ORAL_TABLET | Freq: Every day | ORAL | Status: DC
  Administered 2019-03-15: 100 mg via ORAL
  Filled 2019-03-15: qty 2

## 2019-03-15 MED ORDER — OXYCODONE HCL 5 MG PO TABS: 5.0000 mg | ORAL_TABLET | ORAL | Status: DC | PRN

## 2019-03-15 MED ORDER — PNEUMOCOCCAL VAC POLYVALENT 25 MCG/0.5ML IJ INJ
0.5000 mL | INJECTION | INTRAMUSCULAR | Status: AC
  Administered 2019-03-16: 0.5 mL via INTRAMUSCULAR
  Filled 2019-03-15: qty 0.5

## 2019-03-15 MED ORDER — FUROSEMIDE 10 MG/ML IJ SOLN: 40.0000 mg | Freq: Every day | INTRAMUSCULAR | Status: DC

## 2019-03-15 MED ORDER — MORPHINE SULFATE (PF) 2 MG/ML IV SOLN: 2.0000 mg | INTRAVENOUS | Status: DC | PRN

## 2019-03-15 MED ORDER — FAMOTIDINE 10 MG PO TABS
10.0000 mg | ORAL_TABLET | Freq: Every day | ORAL | Status: DC
  Administered 2019-03-15 – 2019-03-18 (×3): 10 mg via ORAL
  Filled 2019-03-15 (×4): qty 1

## 2019-03-15 MED ORDER — PRAVASTATIN SODIUM 40 MG PO TABS
40.0000 mg | ORAL_TABLET | Freq: Every day | ORAL | Status: DC
  Administered 2019-03-15: 40 mg via ORAL
  Filled 2019-03-15: qty 1

## 2019-03-15 MED ORDER — HEPARIN (PORCINE) 25000 UT/250ML-% IV SOLN
900.0000 [IU]/h | INTRAVENOUS | Status: DC
  Administered 2019-03-15: 1000 [IU]/h via INTRAVENOUS
  Administered 2019-03-16: 900 [IU]/h via INTRAVENOUS
  Filled 2019-03-15 (×2): qty 250

## 2019-03-15 NOTE — ED Notes (Signed)
Report called to Reatha Armour, Therapist, sports at [facility] at [facility]

## 2019-03-15 NOTE — Consult Note (Addendum)
Cardiology Consultation:   Patient ID: Ann Bernard MRN: [id number]; DOB: 01-Jun-1945  Admit date: 03/14/2019 Date of Consult: 03/15/2019  Primary Care Provider: Dorthula Matas, PA-C Primary Cardiologist: New to [facility] (Dr. Antoine Poche) Primary Electrophysiologist:  None    Patient Profile:   Ann Bernard is a 73 y.o. female with a history of hypertension, hyperlipidemia, and tobacco use but no known cardiac history who is being seen today for the evaluation of acute CHF at the request of Dr. Sharyon Medicus.  History of Present Illness:   Ann Bernard is a 73 year old female with a history of hypertension, hyperlipidemia, and tobacco use but no known cardiac history. Patient does not see a Cardiologist and has never had a cardiac work-up. Of note, patient has CAD listed under her history but patient is unaware of this. This may have been added to her history while she was in the ED when chest CTA showed coronary artery calcifications consistent with CAD.  Patient presented to the ED for further evaluation of shortness of breath and chest discomfort. Patient reports worsening dyspnea on exertion for x2-3 weeks. Patient has a long drive way. She states walks to her mailbox daily to get her steps in. She started noticing more shortness of breath with this about 2-3 weeks ago that would resolve quickly when she went in the house and set down. On Friday, patient went to [facility] and states she felt exhausted with this. On Saturday, she noticed worsening shortness of breath when walking to her mailbox that did not resolve with rest and well as substernal chest discomfort that patient states felt like "indigestion." When asked to elaborate on this, patient states she felt like sometimes was stuck in her chest. She also described it as a vague heaviness. She burped and then the pain eased off. She then laid down and fell asleep but woke up with shortness of breath and return of indigestion. She states she then had a  bowel movement which helped some but not completely. Symptoms continued on Sunday but were much worse with associated nausea (no vomiting) and diaphoresis. Patient ranked the pain as a 9/10 on the pain scale at time. Patient had some heart racing with this but denies any lightheadedness, dizziness, or syncope. Patient denies any recent orthopnea, edema, or weight gain. No recent fevers or illnesses.   In the ED, EKG showed sinus arrhythmia with LBBB (this was seen on a prior tracing in 07/2018 per chart review in Care Everywhere). High-sensitivity troponin elevated at 22 >> 60 >> 493 >> 478. BNP elevated at 1,035.7. Chest x-ray showed bilateral interstitial thickening and cardiomegaly most concerning for mild CHF vs interstitial infection. D-dimer elevated at 2.03. Chest CTA negative for PE but showed heterogenous and ground glass opacities mostly in dependent right lower lobe most consistent with infection. Also noted small bilateral pleural effusion as well as cardiomegaly and CAD. WBC 7.2, Hgb 15.9, Plts 207. Na 142, K 3.7, Glucose 124, Scr 0.86. LFTs normal. COVID-19 negative. Patient was given Aspirin 324mg  in the ED and started on IV Heparin. She also received IV Lasix with some improvement in symptoms.  At the time of this evaluation, patient denies any chest discomfort and states shortness of breath has improved at rest. She states she still has some increased work of breathing and shortness of breath when ambulating to the restroom but is doing much better at rest.  Patient has a 55 + year smoking history and currently smokes 1 pack of cigarettes per  week. She denies any alcohol use. She denies any known family history of CAD but reports everyone in her family has hypertension and diabetes. Her mother died at the age of 29 and had hypertension and diabetes. Her father died when she was a baby from what she thinks was alcoholism. Her brother also has a history of diabetes.   Heart Pathway Score:       Past Medical History:  Diagnosis Date   Coronary artery disease    Hypertension     History reviewed. No pertinent surgical history.   Home Medications:  Prior to Admission medications   Medication Sig Start Date End Date Taking? Authorizing Provider  alendronate (FOSAMAX) 70 MG tablet Take 70 mg by mouth once a week. 01/27/19   [provider]  losartan (COZAAR) 100 MG tablet Take 100 mg by mouth daily.    [provider]  pravastatin (PRAVACHOL) 40 MG tablet Take 40 mg by mouth daily.    [provider]  ranitidine (ZANTAC) 150 MG capsule Take 1 capsule (150 mg total) by mouth 2 (two) times daily. 05/07/15   Nelva Nay, MD    Inpatient Medications: Scheduled Meds:  famotidine  10 mg Oral Daily   furosemide  40 mg Intravenous BID   losartan  100 mg Oral Daily   nitroGLYCERIN  0.2 mg Transdermal Q24H   [START ON 03/16/2019] pneumococcal 23 valent vaccine  0.5 mL Intramuscular Tomorrow-1000   pravastatin  40 mg Oral Daily   sodium chloride flush  3 mL Intravenous Q12H   Continuous Infusions:  sodium chloride Stopped (03/15/19 0226)   sodium chloride     azithromycin Stopped (03/15/19 0046)   cefTRIAXone (ROCEPHIN)  IV Stopped (03/14/19 2259)   heparin Stopped (03/15/19 1401)   PRN Meds: sodium chloride, sodium chloride, morphine injection, ondansetron **OR** ondansetron (ZOFRAN) IV, oxyCODONE, sodium chloride flush, zolpidem  Allergies:    Allergies  Allergen Reactions   Aspirin Nausea Only and Other (See Comments)    GI Upset ans stomach cramps        Social History:   Social History   Socioeconomic History   Marital status: Widowed    Spouse name: Not on file   Number of children: Not on file   Years of education: Not on file   Highest education level: Not on file  Occupational History   Not on file  Social Needs   Financial resource strain: Not hard at all   Food insecurity    Worry: Never true     Inability: Never true   Transportation needs    Medical: No    Non-medical: No  Tobacco Use   Smoking status: Current Some Day Smoker   Smokeless tobacco: Never Used  Substance and Sexual Activity   Alcohol use: No   Drug use: Never   Sexual activity: Not Currently  Lifestyle   Physical activity    Days per week: 0 days    Minutes per session: Not on file   Stress: Not at all  Relationships   Social connections    Talks on phone: More than three times a week    Gets together: Once a week    Attends religious service: More than 4 times per year    Active member of club or organization: No    Attends meetings of clubs or organizations: Never    Relationship status: Widowed   Intimate partner violence    Fear of current or ex partner: No  Emotionally abused: No    Physically abused: No    Forced sexual activity: No  Other Topics Concern   Not on file  Social History Narrative   Not on file    Family History:    Family History  Problem Relation Age of Onset   Hypertension Mother    Diabetes Mother    Diabetes Brother    Alcoholism Father    Diabetes type I Grandson      ROS:  Please see the history of present illness.  Review of Systems  Constitutional: Positive for malaise/fatigue. Negative for chills and fever.  HENT: Congestion: nasal drainage in morning; possible allergies.   Respiratory: Positive for shortness of breath. Negative for hemoptysis.   Cardiovascular: Positive for chest pain and palpitations. Negative for orthopnea and leg swelling.  Gastrointestinal: Positive for nausea. Negative for constipation, melena and vomiting.  Genitourinary: Negative for hematuria.  Musculoskeletal: Negative for myalgias.  Neurological: Negative for dizziness and loss of consciousness.  Endo/Heme/Allergies: Does not bruise/bleed easily.  Psychiatric/Behavioral: Positive for substance abuse (tobacco abuse).    Physical Exam/Data:   Vitals:    03/15/19 1330 03/15/19 1400 03/15/19 1508 03/15/19 1623  BP: (!) 123/92 125/90  121/90  Pulse: (!) 105   98  Resp: 17   18  Temp:    98 F (36.7 C)  TempSrc:    Oral  SpO2: 98%   99%  Weight:      Height:   5\' 7"  (1.702 m)     Intake/Output Summary (Last 24 hours) at 03/15/2019 1724 Last data filed at 03/15/2019 1544 Gross per 24 hour  Intake 502.01 ml  Output 1500 ml  Net -997.99 ml   Last 3 Weights 03/14/2019 05/07/2015 03/08/2009  Weight (lbs) 199 lb 15.3 oz 192 lb 181 lb  Weight (kg) 90.7 kg 87.091 kg 82.101 kg     Body mass index is 31.32 kg/m.  General: 73 y.o. African-American female resting comfortably in no acute distress. On 2L of O2 via [place]. HEENT: Normocephalic and atraumatic. Sclera clear. EOMs intact. Neck: Supple. No carotid bruits. No significant JVD appreciated. Heart: RRR. Distinct S1 and S2. No murmurs, gallops, or rubs. Radial and distal pedal pulses 2+ and equal bilaterally. Lungs: No increased work of breathing. Faint crackles noted in left base but lungs otherwise clear. Abdomen: Soft, non-distended, and non-tender to palpation. Bowel sounds present in all 4 quadrants.  MSK: Normal strength and tone for age. Extremities: No lower extremity edema. No deformities. Skin: Warm and dry. Neuro: Alert and oriented x3. No focal deficits. Psych: Normal affect. Responds appropriately.  EKG:  The EKG was personally reviewed and demonstrates: Sinus arrhythmia, rate 126, with LBBB and baseline wandering. LBBB new from last tracing in our system in 2016 but was noted on EKG in 07/2018 in [place] (unable to personally review this tracing but can see the report). Telemetry:  Telemetry was personally reviewed and demonstrates:  Normal sinus rhythm with rates in the 80's to low 100's.  Relevant CV Studies: Echo pending.  Laboratory Data:  High Sensitivity Troponin:   Recent Labs  Lab 03/14/19 1600 03/14/19 1856 03/14/19 2253 03/15/19 0115  TROPONINIHS  22* 60* 493* 478*     Chemistry Recent Labs  Lab 03/14/19 1600  NA 142  K 3.7  CL 109  CO2 23  GLUCOSE 124*  BUN 16  CREATININE 0.86  CALCIUM 9.3  GFRNONAA >60  GFRAA >60  ANIONGAP 10    Recent Labs  Lab 03/14/19 1600  PROT 7.3  ALBUMIN 4.1  AST 24  ALT 38  ALKPHOS 59  BILITOT 0.8   Hematology Recent Labs  Lab 03/14/19 1600  WBC 7.2  RBC 5.03  HGB 15.9*  HCT 51.0*  MCV 101.4*  MCH 31.6  MCHC 31.2  RDW 13.5  PLT 207   BNP Recent Labs  Lab 03/14/19 1600  BNP 1,035.7*    DDimer  Recent Labs  Lab 03/14/19 1600  DDIMER 2.03*     Radiology/Studies:  Ct Angio Chest Pe W And/or Wo Contrast  Result Date: 03/14/2019 CLINICAL DATA:  Shortness of breath, chest pain EXAM: CT ANGIOGRAPHY CHEST WITH CONTRAST TECHNIQUE: Multidetector CT imaging of the chest was performed using the standard protocol during bolus administration of intravenous contrast. Multiplanar CT image reconstructions and MIPs were obtained to evaluate the vascular anatomy. CONTRAST:  81mL OMNIPAQUE IOHEXOL 350 MG/ML SOLN COMPARISON:  None. FINDINGS: Cardiovascular: Satisfactory opacification of the pulmonary arteries to the segmental level. No evidence of pulmonary embolism. Cardiomegaly. Scattered left coronary artery calcifications. Trace pericardial effusion. Scattered aortic atherosclerosis. Mediastinum/Nodes: No enlarged mediastinal, hilar, or axillary lymph nodes. Thyroid gland, trachea, and esophagus demonstrate no significant findings. Lungs/Pleura: Mild centrilobular emphysema. Heterogeneous and ground-glass opacities, most conspicuous in the dependent right lower lobe. Diffuse bilateral bronchial wall thickening. Small, right greater than left pleural effusions. Upper Abdomen: No acute abnormality. Musculoskeletal: No chest wall abnormality. No acute or significant osseous findings. Review of the MIP images confirms the above findings. IMPRESSION: 1.  Negative examination for pulmonary  embolism. 2. Heterogeneous and ground-glass opacities, most conspicuous in the dependent right lower lobe. Findings are most consistent with infection, however edema could have this asymmetric appearance. 3.  Small bilateral pleural effusions. 4.  Diffuse bilateral bronchial wall thickening, nonspecific. 5.  Cardiomegaly and coronary artery disease. 6.  Trace, nonspecific pericardial effusion. 7.  Aortic Atherosclerosis (ICD10-I70.0). Electronically Signed   By: Lauralyn PrimesAlex  Bibbey M.D.   On: 03/14/2019 20:59   Dg Chest Portable 1 View  Result Date: 03/14/2019 CLINICAL DATA:  Chest pain, shortness of breath EXAM: PORTABLE CHEST 1 VIEW COMPARISON:  05/07/2015 FINDINGS: Bilateral diffuse interstitial thickening. No pleural effusion or pneumothorax. Stable cardiomegaly. No acute osseous abnormality. IMPRESSION: Bilateral interstitial thickening and cardiomegaly most concerning for mild CHF versus interstitial infection. Electronically Signed   By: Elige KoHetal  Patel   On: 03/14/2019 16:45    Assessment and Plan:   Newly Diagnosed Acute CHF - Type Unknown - Patient presented with worsening dyspnea on exertion and chest discomfort.  - Chest x-ray showed bilateral interstitial thickening and cardiomegaly most concerning for mild CHF vs. Interstitial infection. Chest CTA more concerning for infection of right lower base. - BNP elevated at 1,035. - Patient was started on IV Lasix in the ED with improvement in symptoms. She does not appear gross volume overloaded at this times. She just received another dose of IV Lasix. - Will continue IV Lasix 40mg  daily for now as patient is Lasix naive. - Echo pending.  - Continue to monitor daily weights, strict I/O's, and renal function.  Chest Pain: NSTEMI vs. Demand Ischemia - Patient presents with chest discomfort that she describes as "ingestion" and "heaviness." She also had worsening dyspnea on exertion which could also be anginal equivalent. Symptoms improved with Lasix.  No known history of CAD but coronary artery calcifications noted on chest CTA. - EKG shows sinus rhythm with LBBB. LBBB new from last tracing in our system in 2016 but was noted  on EKG in 07/2018 in Care Everywhere (unable to personally review this tracing but can see the report). - High-sensitivity troponin 22 >> 60 >> 493 >> 478. - Chest CTA negative for PE. - Echo pending. - Currently chest pain free. - Will check hemoglobin A1c and fasting lipid panel. - CV risk factor include HTN, HLD, tobacco use, and age. - Differential includes true NSTEMI vs demand ischemia due to acute CHF. Continue IV Heparin for now. Continue Aspirin and statin. Will hold off on starting beta-blocker until Echo results are back. If patient has reduced EF or wall motion abnormality, will need left heart catheterization. May need this regardless. Will discuss with MD. Will go ahead and make NPO at midnight just in case.  Hypertension - BP currently well controlled. - Continue home Losartan 100mg  daily.  Hyperlipidemia - Will check fasting lipid panel.  - Continue home Pravastatin 40mg  daily for now but suspect we may transition to high-intensity statin.   Elevated D-Dimer - D-dimer elevated at 2.03.  - Chest CTA negative for PE.  Otherwise, per primary team.  For questions or updates, please contact [facility] Please consult [email or web address] for contact info under     Signed, Corrin ParkerCallie E Goodrich, PA-C  03/15/2019 5:24 PM   History and all data above reviewed.  Patient examined.  I agree with the findings as above.  The patient has no past cardiac history as stated above.  She usually does very well.  She said though over the last month she has been ambulating from her mailbox a little bit up a hill and up some stairs into her apartment and she is weak and very short of breath.  She has to stop what she is doing.  She has to wait 5 to 10 minutes to recover.  She denies any chest neck or arm discomfort however.   This is a new symptom.  She is found to have mildly elevated troponins as above.  She has a left bundle branch block which is not new.  She has an elevated BNP and a high-sensitivity troponin that is not diagnostic.  The patient exam reveals COR:PMI displaced and sustained with positive S3  ,  Lungs: Clear  ,  Abd: Positive bowel sounds, no rebound no guarding, Ext No edema  .  All available labs, radiology testing, previous records reviewed. Agree with documented assessment and plan.   Acute CHF.  Suspect reduced EF.  Echo early in the AM and right and left cath tomorrow.    Rollene RotundaJames Zyad Boomer  6:19 PM  03/15/2019

## 2019-03-15 NOTE — ED Notes (Signed)
Pt taken off BiPAP placed on 2LT [place].

## 2019-03-15 NOTE — Plan of Care (Signed)
  Problem: Clinical Measurements: Goal: Ability to maintain clinical measurements within normal limits will improve Outcome: Progressing Goal: Will remain free from infection Outcome: Progressing Goal: Cardiovascular complication will be avoided Outcome: Progressing   Problem: Elimination: Goal: Will not experience complications related to urinary retention Outcome: Progressing   Problem: Skin Integrity: Goal: Risk for impaired skin integrity will decrease Outcome: Progressing

## 2019-03-15 NOTE — Progress Notes (Signed)
Patient admits from [facility] to [facility] with c.o chest pain. She is alert oriented x 4, denies SOB, chest pain at this time. Vital signs within normal range. Patient is in bed locked at lower level, bedside table and call light within patient reach. Family (son) at bedside. Will continue to monitor patient.

## 2019-03-15 NOTE — ED Notes (Addendum)
Turned off heparin per Jeneen Rinks in pharmacy - will restart at [facility]

## 2019-03-15 NOTE — Progress Notes (Signed)
Pinch for Heparin Indication: CP/elevated troponin   Allergies  Allergen Reactions  . Aspirin Nausea Only and Other (See Comments)    GI Upset ans stomach cramps        Patient Measurements: Height: 5\' 7"  (170.2 cm) Weight: 199 lb 15.3 oz (90.7 kg) IBW/kg (Calculated) : 61.6 Heparin Dosing Weight: 81 kg  Vital Signs: Temp: 98 F (36.7 C) (10/26 1623) Temp Source: Oral (10/26 1623) BP: 121/90 (10/26 1623) Pulse Rate: 98 (10/26 1623)  Labs: Recent Labs    03/14/19 1600 03/14/19 1856 03/14/19 2253 03/15/19 0115 03/15/19 0330 03/15/19 1658  HGB 15.9*  --   --   --   --   --   HCT 51.0*  --   --   --   --   --   PLT 207  --   --   --   --   --   HEPARINUNFRC  --   --   --   --  1.26* 0.71*  CREATININE 0.86  --   --   --   --   --   TROPONINIHS 22* 60* 493* 478*  --   --     Estimated Creatinine Clearance: 67.3 mL/min (by C-G formula based on SCr of 0.86 mg/dL).   Medical History: Past Medical History:  Diagnosis Date  . Coronary artery disease   . Hypertension     Medications:  Infusions:  . sodium chloride Stopped (03/15/19 0226)  . sodium chloride    . azithromycin Stopped (03/15/19 0046)  . cefTRIAXone (ROCEPHIN)  IV Stopped (03/14/19 2259)  . heparin Stopped (03/15/19 1401)    Assessment: Patient is a 73 yr old female with a hx of CHF who presents to the ED with chest pain and shortness of breath. Patient was having a COPD exacerbation currently preventing her from getting a CT-PE at that time; chest CTA today was negative for PE. Current differential dx is true NSTEMI vs demand ischemia due to acute CHF. Echo and possible cath planned for tomorrow.  Heparin level earlier today was supra-therapeutic at 1.26 units/ml. Heparin infusion was decreased to 1000 units/hr; heparin level drawn ~9.5 hrs after rate was decreased to 1000 units/hr was 0.71 units/ml, which is slightly above goal range for this pt. Per RN,  no issues with IV or bleeding observed.  H/H 15.0/51.0; platelets 207   Goal of Therapy:  Heparin level 0.3-0.7 units/ml Monitor platelets by anticoagulation protocol: Yes   Plan:  Reduce heparin infusion to 900 units/hr Check 8-hr heparin level, then daily Monitor CBC daily Monitor for signs/symptoms of bleeding  Gillermina Hu, PharmD, BCPS, [facility] Clinical Pharmacist

## 2019-03-15 NOTE — H&P (Signed)
[facility]                                                                                    Patient Demographics  Ann Bernard, is a 73 y.o. female  CSN: [id number]  MRN: [id number]  DOB - 1946/03/02  Admit Date - 03/14/2019  Outpatient Primary MD for the patient is Dorthula Matas, PA-C   With History of -  Past Medical History:  Diagnosis Date  . Coronary artery disease   . Hypertension       History reviewed. No pertinent surgical history.  in for   Chief Complaint  Patient presents with  . Chest Pain     HPI  Ann Bernard  is a 73 y.o. female, with past medical history significant for hypertension and hyperlipidemia, presenting today with 3 weeks history of worsening chest heaviness with epigastric discomfort associated with nausea, diaphoresis and shortness of breath.  It started on exertion and now is continuing at rest .  No history of fever chills diarrhea.  No history of PND's or orthopnea Work-up in the emergency room showed chronic LBBB and elevated troponin.  Her CBC and BMP were benign. Patient denies any chest pains at the moment. Discussed with cardiology at bedside   Review of Systems    In addition to the HPI above,  No Fever-chills, No Headache, No changes with Vision or hearing, No problems swallowing food or Liquids, No Abdominal pain, , Bowel movements are regular, No Blood in stool or Urine, No dysuria, No new skin rashes or bruises, No new joints pains-aches,  No new weakness, tingling, numbness in any extremity, No recent weight gain or loss, No polyuria, polydypsia or polyphagia, No significant Mental Stressors.  All other systems were reviewed and were negative   Social History Social History   Tobacco Use  . Smoking status: Current Some Day Smoker  . Smokeless tobacco: Never Used  Substance Use Topics  . Alcohol use: No     Family History Significant for diabetes  Prior to Admission medications    Medication Sig Start Date End Date Taking? Authorizing Provider  alendronate (FOSAMAX) 70 MG tablet Take 70 mg by mouth once a week. 01/27/19   [provider]  losartan (COZAAR) 100 MG tablet Take 100 mg by mouth daily.    [provider]  pravastatin (PRAVACHOL) 40 MG tablet Take 40 mg by mouth daily.    [provider]  ranitidine (ZANTAC) 150 MG capsule Take 1 capsule (150 mg total) by mouth 2 (two) times daily. 05/07/15   Nelva Nay, MD    Allergies  Allergen Reactions  . Aspirin Nausea Only and Other (See Comments)    GI Upset ans stomach cramps        Physical Exam  Vitals  Blood pressure 121/90, pulse 98, temperature 98 F (36.7 C), temperature source Oral, resp. rate 18, height 5\' 7"  (1.702 m), weight 90.7 kg, SpO2 99 %.  General appearance very pleasant lady, well-developed, well-nourished in no acute distress at this time HEENT no jaundice or pallor, no facial deviation Neck supple, no neck vein distention Chest decreased breath sounds at the  bases with possible fine crackles Heart normal S1-S2, no murmurs gallops rubs Abdomen soft, nontender, bowel sounds are present Extremities no clubbing cyanosis or edema noted Skin no rashes or ulcers   Data Review  CBC Recent Labs  Lab 03/14/19 1600  WBC 7.2  HGB 15.9*  HCT 51.0*  PLT 207  MCV 101.4*  MCH 31.6  MCHC 31.2  RDW 13.5  LYMPHSABS 2.8  MONOABS 0.6  EOSABS 0.1  BASOSABS 0.0   ------------------------------------------------------------------------------------------------------------------  Chemistries  Recent Labs  Lab 03/14/19 1600  NA 142  K 3.7  CL 109  CO2 23  GLUCOSE 124*  BUN 16  CREATININE 0.86  CALCIUM 9.3  AST 24  ALT 38  ALKPHOS 59  BILITOT 0.8   ------------------------------------------------------------------------------------------------------------------ estimated creatinine clearance is 67.3 mL/min (by C-G formula based on SCr of 0.86  mg/dL). ------------------------------------------------------------------------------------------------------------------ No results for input(s): TSH, T4TOTAL, T3FREE, THYROIDAB in the last 72 hours.  Invalid input(s): FREET3   Coagulation profile No results for input(s): INR, PROTIME in the last 168 hours. ------------------------------------------------------------------------------------------------------------------- Recent Labs    03/14/19 1600  DDIMER 2.03*   -------------------------------------------------------------------------------------------------------------------  Cardiac Enzymes No results for input(s): CKMB, TROPONINI, MYOGLOBIN in the last 168 hours.  Invalid input(s): CK ------------------------------------------------------------------------------------------------------------------ Invalid input(s): POCBNP   ---------------------------------------------------------------------------------------------------------------  Urinalysis No results found for: COLORURINE, APPEARANCEUR, [place], [place], GLUCOSEU, HGBUR, BILIRUBINUR, KETONESUR, PROTEINUR, UROBILINOGEN, NITRITE, LEUKOCYTESUR  ----------------------------------------------------------------------------------------------------------------     Imaging results:   Ct Angio Chest Pe W And/or Wo Contrast  Result Date: 03/14/2019 CLINICAL DATA:  Shortness of breath, chest pain EXAM: CT ANGIOGRAPHY CHEST WITH CONTRAST TECHNIQUE: Multidetector CT imaging of the chest was performed using the standard protocol during bolus administration of intravenous contrast. Multiplanar CT image reconstructions and MIPs were obtained to evaluate the vascular anatomy. CONTRAST:  67mL OMNIPAQUE IOHEXOL 350 MG/ML SOLN COMPARISON:  None. FINDINGS: Cardiovascular: Satisfactory opacification of the pulmonary arteries to the segmental level. No evidence of pulmonary embolism. Cardiomegaly. Scattered left coronary artery  calcifications. Trace pericardial effusion. Scattered aortic atherosclerosis. Mediastinum/Nodes: No enlarged mediastinal, hilar, or axillary lymph nodes. Thyroid gland, trachea, and esophagus demonstrate no significant findings. Lungs/Pleura: Mild centrilobular emphysema. Heterogeneous and ground-glass opacities, most conspicuous in the dependent right lower lobe. Diffuse bilateral bronchial wall thickening. Small, right greater than left pleural effusions. Upper Abdomen: No acute abnormality. Musculoskeletal: No chest wall abnormality. No acute or significant osseous findings. Review of the MIP images confirms the above findings. IMPRESSION: 1.  Negative examination for pulmonary embolism. 2. Heterogeneous and ground-glass opacities, most conspicuous in the dependent right lower lobe. Findings are most consistent with infection, however edema could have this asymmetric appearance. 3.  Small bilateral pleural effusions. 4.  Diffuse bilateral bronchial wall thickening, nonspecific. 5.  Cardiomegaly and coronary artery disease. 6.  Trace, nonspecific pericardial effusion. 7.  Aortic Atherosclerosis (ICD10-I70.0). Electronically Signed   By: Eddie Candle M.D.   On: 03/14/2019 20:59   Dg Chest Portable 1 View  Result Date: 03/14/2019 CLINICAL DATA:  Chest pain, shortness of breath EXAM: PORTABLE CHEST 1 VIEW COMPARISON:  05/07/2015 FINDINGS: Bilateral diffuse interstitial thickening. No pleural effusion or pneumothorax. Stable cardiomegaly. No acute osseous abnormality. IMPRESSION: Bilateral interstitial thickening and cardiomegaly most concerning for mild CHF versus interstitial infection. Electronically Signed   By: Kathreen Devoid   On: 03/14/2019 16:45    My personal review of EKG: Sinus arrhythmia at 126 bpm with left bundle branch block    Assessment & Plan  Acute coronary syndrome , EKG shows LBBB Cardiology  on consult Check serial troponins Continue with heparin drip for now.  Pulmonary  edema Continue with Lasix Check echocardiogram Antibiotics were DC'd.  Doubt pneumonia  History of hypertension We will hold her Cozaar due to diuresis and monitor creatinine  Hyperlipidemia Continue Zocor  GERD Continue with Pepcid  DVT Prophylaxis Heparin drip  AM Labs Ordered, also please review Full Orders  Family Communication: Discussed with son at bedside.  Code Status full  Disposition Plan: Home  Time spent in minutes : 51 minutes  Condition GUARDED   @SIGNATURE @

## 2019-03-15 NOTE — ED Notes (Signed)
Pt transferred to [facility]  Via [facility]link.

## 2019-03-15 NOTE — Progress Notes (Signed)
[place] for Heparin Indication: r/o PE, CP/elevated troponin   Allergies  Allergen Reactions  . Aspirin Nausea Only    Other reaction(s): GI Upset (intolerance) Stomach cramps Stomach cramps     Patient Measurements: Height: 5\' 7"  (170.2 cm) Weight: 199 lb 15.3 oz (90.7 kg) IBW/kg (Calculated) : 61.6 Heparin Dosing Weight: 90.7 kg  Vital Signs: Temp: 97.5 F (36.4 C) (10/26 0231) BP: 110/95 (10/26 0430) Pulse Rate: 85 (10/26 0430)  Labs: Recent Labs    03/14/19 1600 03/14/19 1856 03/14/19 2253 03/15/19 0115 03/15/19 0330  HGB 15.9*  --   --   --   --   HCT 51.0*  --   --   --   --   PLT 207  --   --   --   --   HEPARINUNFRC  --   --   --   --  1.26*  CREATININE 0.86  --   --   --   --   TROPONINIHS 22* 60* 493* 478*  --     Estimated Creatinine Clearance: 67.3 mL/min (by C-G formula based on SCr of 0.86 mg/dL).   Medical History: Past Medical History:  Diagnosis Date  . Coronary artery disease   . Hypertension     Medications:  Infusions:  . sodium chloride Stopped (03/15/19 0230)  . azithromycin Stopped (03/15/19 0046)  . cefTRIAXone (ROCEPHIN)  IV Stopped (03/14/19 2259)  . heparin      Assessment: Patient is a 48 yof with a hx of CHF whom presents to the ED with chest pain and shortness of breath. Patient is having a COPD exacerbation currently preventing her from getting a CT-PE at this time. MD wished to start heparin empirically.  10/26 AM update:  Heparin level elevated this AM No issues per RN No PE on CT  Now with elevated trop   Goal of Therapy:  Heparin level 0.3-0.7 units/ml Monitor platelets by anticoagulation protocol: Yes   Plan:  -Hold heparin x 1 hr -Re-start heparin at 1000 units/hr at 0700 -1500 heparin level   Narda Bonds, PharmD, [facility] Pharmacist Phone: [phone]

## 2019-03-16 ENCOUNTER — Encounter (HOSPITAL_COMMUNITY)
Admission: EM | Disposition: A | Discharge status: Home / Self Care | Payer: Self-pay | Source: Home / Self Care | Attending: Internal Medicine

## 2019-03-16 ENCOUNTER — Inpatient Hospital Stay (HOSPITAL_COMMUNITY): Payer: Medicare HMO

## 2019-03-16 DIAGNOSIS — I340 Nonrheumatic mitral (valve) insufficiency: Secondary | ICD-10-CM

## 2019-03-16 DIAGNOSIS — I361 Nonrheumatic tricuspid (valve) insufficiency: Secondary | ICD-10-CM

## 2019-03-16 DIAGNOSIS — E785 Hyperlipidemia, unspecified: Secondary | ICD-10-CM

## 2019-03-16 DIAGNOSIS — R072 Precordial pain: Secondary | ICD-10-CM

## 2019-03-16 HISTORY — PX: RIGHT/LEFT HEART CATH AND CORONARY ANGIOGRAPHY: CATH118266

## 2019-03-16 LAB — POCT I-STAT EG7
Acid-Base Excess: 1 mmol/L (ref 0.0–2.0)
Bicarbonate: 26.9 mmol/L (ref 20.0–28.0)
Calcium, Ion: 1.21 mmol/L (ref 1.15–1.40)
HCT: 42 % (ref 36.0–46.0)
Hemoglobin: 14.3 g/dL (ref 12.0–15.0)
O2 Saturation: 68 %
Potassium: 3.3 mmol/L — ABNORMAL LOW (ref 3.5–5.1)
Sodium: 143 mmol/L (ref 135–145)
TCO2: 28 mmol/L (ref 22–32)
pCO2, Ven: 46.6 mmHg (ref 44.0–60.0)
pH, Ven: 7.369 (ref 7.250–7.430)
pO2, Ven: 37 mmHg (ref 32.0–45.0)

## 2019-03-16 LAB — CBC
HCT: 43.4 % (ref 36.0–46.0)
Hemoglobin: 14.4 g/dL (ref 12.0–15.0)
MCH: 32.6 pg (ref 26.0–34.0)
MCHC: 33.2 g/dL (ref 30.0–36.0)
MCV: 98.2 fL (ref 80.0–100.0)
Platelets: 161 10*3/uL (ref 150–400)
RBC: 4.42 MIL/uL (ref 3.87–5.11)
RDW: 13.2 % (ref 11.5–15.5)
WBC: 6.5 10*3/uL (ref 4.0–10.5)
nRBC: 0 % (ref 0.0–0.2)

## 2019-03-16 LAB — BASIC METABOLIC PANEL
Anion gap: 10 (ref 5–15)
BUN: 15 mg/dL (ref 8–23)
CO2: 25 mmol/L (ref 22–32)
Calcium: 8.9 mg/dL (ref 8.9–10.3)
Chloride: 107 mmol/L (ref 98–111)
Creatinine, Ser: 0.85 mg/dL (ref 0.44–1.00)
GFR calc Af Amer: >60 mL/min (ref >60)
GFR calc non Af Amer: >60 mL/min (ref >60)
Glucose, Bld: 105 mg/dL — ABNORMAL HIGH (ref 70–99)
Potassium: 3.5 mmol/L (ref 3.5–5.1)
Sodium: 142 mmol/L (ref 135–145)

## 2019-03-16 LAB — ECHOCARDIOGRAM COMPLETE
Height: 67 in
Weight: 3199.32 oz

## 2019-03-16 LAB — LIPID PANEL
Cholesterol: 194 mg/dL (ref 0–200)
HDL: 53 mg/dL (ref >40)
LDL Cholesterol: 128 mg/dL — ABNORMAL HIGH (ref 0–99)
Total CHOL/HDL Ratio: 3.7 RATIO
Triglycerides: 65 mg/dL (ref <150)
VLDL: 13 mg/dL (ref 0–40)

## 2019-03-16 LAB — POCT I-STAT 7, (LYTES, BLD GAS, ICA,H+H)
Acid-base deficit: 1 mmol/L (ref 0.0–2.0)
Bicarbonate: 24.3 mmol/L (ref 20.0–28.0)
Calcium, Ion: 1.2 mmol/L (ref 1.15–1.40)
HCT: 41 % (ref 36.0–46.0)
Hemoglobin: 13.9 g/dL (ref 12.0–15.0)
O2 Saturation: 97 %
Potassium: 3.3 mmol/L — ABNORMAL LOW (ref 3.5–5.1)
Sodium: 139 mmol/L (ref 135–145)
TCO2: 26 mmol/L (ref 22–32)
pCO2 arterial: 43.3 mmHg (ref 32.0–48.0)
pH, Arterial: 7.357 (ref 7.350–7.450)
pO2, Arterial: 98 mmHg (ref 83.0–108.0)

## 2019-03-16 LAB — HEMOGLOBIN A1C
Hgb A1c MFr Bld: 5.5 % (ref 4.8–5.6)
Mean Plasma Glucose: 111.15 mg/dL

## 2019-03-16 LAB — HEPARIN LEVEL (UNFRACTIONATED): Heparin Unfractionated: 0.45 IU/mL (ref 0.30–0.70)

## 2019-03-16 SURGERY — RIGHT/LEFT HEART CATH AND CORONARY ANGIOGRAPHY
Anesthesia: LOCAL

## 2019-03-16 MED ORDER — HEPARIN (PORCINE) IN NACL 1000-0.9 UT/500ML-% IV SOLN
INTRAVENOUS | Status: AC
  Filled 2019-03-16: qty 1000

## 2019-03-16 MED ORDER — HEPARIN SODIUM (PORCINE) 1000 UNIT/ML IJ SOLN
INTRAMUSCULAR | Status: AC
  Filled 2019-03-16: qty 1

## 2019-03-16 MED ORDER — HEPARIN SODIUM (PORCINE) 1000 UNIT/ML IJ SOLN
INTRAMUSCULAR | Status: DC | PRN
  Administered 2019-03-16: 4500 [IU] via INTRAVENOUS

## 2019-03-16 MED ORDER — SODIUM CHLORIDE 0.9% FLUSH
3.0000 mL | Freq: Two times a day (BID) | INTRAVENOUS | Status: DC
  Administered 2019-03-16 – 2019-03-17 (×2): 3 mL via INTRAVENOUS

## 2019-03-16 MED ORDER — MIDAZOLAM HCL 2 MG/2ML IJ SOLN
INTRAMUSCULAR | Status: AC
  Filled 2019-03-16: qty 2

## 2019-03-16 MED ORDER — VERAPAMIL HCL 2.5 MG/ML IV SOLN
INTRAVENOUS | Status: AC
  Filled 2019-03-16: qty 2

## 2019-03-16 MED ORDER — FENTANYL CITRATE (PF) 100 MCG/2ML IJ SOLN
INTRAMUSCULAR | Status: AC
  Filled 2019-03-16: qty 2

## 2019-03-16 MED ORDER — ASPIRIN 81 MG PO CHEW: 81.0000 mg | CHEWABLE_TABLET | ORAL | Status: DC

## 2019-03-16 MED ORDER — SODIUM CHLORIDE 0.9 % IV SOLN: 250.0000 mL | INTRAVENOUS | Status: DC | PRN

## 2019-03-16 MED ORDER — SODIUM CHLORIDE 0.9 % IV SOLN
INTRAVENOUS | Status: AC | PRN
  Administered 2019-03-16: 10 mL/h via INTRAVENOUS

## 2019-03-16 MED ORDER — FENTANYL CITRATE (PF) 100 MCG/2ML IJ SOLN
INTRAMUSCULAR | Status: DC | PRN
  Administered 2019-03-16: 25 ug via INTRAVENOUS

## 2019-03-16 MED ORDER — HEPARIN SODIUM (PORCINE) 5000 UNIT/ML IJ SOLN
5000.0000 [IU] | Freq: Three times a day (TID) | INTRAMUSCULAR | Status: DC
  Administered 2019-03-17 – 2019-03-18 (×4): 5000 [IU] via SUBCUTANEOUS
  Filled 2019-03-16 (×4): qty 1

## 2019-03-16 MED ORDER — SODIUM CHLORIDE 0.9% FLUSH
3.0000 mL | INTRAVENOUS | Status: DC | PRN
  Administered 2019-03-18: 3 mL via INTRAVENOUS
  Filled 2019-03-16: qty 3

## 2019-03-16 MED ORDER — HEPARIN (PORCINE) IN NACL 1000-0.9 UT/500ML-% IV SOLN
INTRAVENOUS | Status: DC | PRN
  Administered 2019-03-16 (×2): 500 mL

## 2019-03-16 MED ORDER — ATORVASTATIN CALCIUM 40 MG PO TABS
40.0000 mg | ORAL_TABLET | Freq: Every day | ORAL | Status: DC
  Administered 2019-03-16 – 2019-03-17 (×2): 40 mg via ORAL
  Filled 2019-03-16 (×2): qty 1

## 2019-03-16 MED ORDER — SODIUM CHLORIDE 0.9% FLUSH: 3.0000 mL | INTRAVENOUS | Status: DC | PRN

## 2019-03-16 MED ORDER — OXYCODONE HCL 5 MG PO TABS
5.0000 mg | ORAL_TABLET | ORAL | Status: DC | PRN
  Administered 2019-03-16: 10 mg via ORAL
  Filled 2019-03-16: qty 2

## 2019-03-16 MED ORDER — HYDRALAZINE HCL 20 MG/ML IJ SOLN: 10.0000 mg | INTRAMUSCULAR | Status: AC | PRN

## 2019-03-16 MED ORDER — SODIUM CHLORIDE 0.9 % IV SOLN: INTRAVENOUS | Status: DC

## 2019-03-16 MED ORDER — PERFLUTREN LIPID MICROSPHERE
1.0000 mL | INTRAVENOUS | Status: AC | PRN
  Administered 2019-03-16: 2 mL via INTRAVENOUS
  Filled 2019-03-16: qty 10

## 2019-03-16 MED ORDER — LABETALOL HCL 5 MG/ML IV SOLN: 10.0000 mg | INTRAVENOUS | Status: AC | PRN

## 2019-03-16 MED ORDER — VERAPAMIL HCL 2.5 MG/ML IV SOLN
INTRAVENOUS | Status: DC | PRN
  Administered 2019-03-16: 10 mL via INTRA_ARTERIAL

## 2019-03-16 MED ORDER — LIDOCAINE HCL (PF) 1 % IJ SOLN
INTRAMUSCULAR | Status: AC
  Filled 2019-03-16: qty 30

## 2019-03-16 MED ORDER — IOHEXOL 350 MG/ML SOLN
INTRAVENOUS | Status: DC | PRN
  Administered 2019-03-16: 16:00:00 60 mL

## 2019-03-16 MED ORDER — ONDANSETRON HCL 4 MG/2ML IJ SOLN: 4.0000 mg | Freq: Four times a day (QID) | INTRAMUSCULAR | Status: DC | PRN

## 2019-03-16 MED ORDER — MIDAZOLAM HCL 2 MG/2ML IJ SOLN
INTRAMUSCULAR | Status: DC | PRN
  Administered 2019-03-16: 1 mg via INTRAVENOUS

## 2019-03-16 MED ORDER — SODIUM CHLORIDE 0.9% FLUSH
3.0000 mL | Freq: Two times a day (BID) | INTRAVENOUS | Status: DC
  Administered 2019-03-16 – 2019-03-18 (×4): 3 mL via INTRAVENOUS

## 2019-03-16 MED ORDER — ACETAMINOPHEN 325 MG PO TABS
650.0000 mg | ORAL_TABLET | ORAL | Status: DC | PRN
  Administered 2019-03-17: 650 mg via ORAL
  Filled 2019-03-16: qty 2

## 2019-03-16 MED ORDER — LIDOCAINE HCL (PF) 1 % IJ SOLN
INTRAMUSCULAR | Status: DC | PRN
  Administered 2019-03-16 (×2): 2 mL

## 2019-03-16 SURGICAL SUPPLY — 12 items
CATH 5FR JL3.5 JR4 ANG PIG MP (CATHETERS) ×1 IMPLANT
CATH BALLN WEDGE 5F 110CM (CATHETERS) ×1 IMPLANT
DEVICE RAD COMP TR BAND LRG (VASCULAR PRODUCTS) ×1 IMPLANT
GLIDESHEATH SLEND A-KIT 6F 22G (SHEATH) ×1 IMPLANT
GUIDEWIRE INQWIRE 1.5J.035X260 (WIRE) IMPLANT
INQWIRE 1.5J .035X260CM (WIRE) ×2
KIT HEART LEFT (KITS) ×2 IMPLANT
PACK CARDIAC CATHETERIZATION (CUSTOM PROCEDURE TRAY) ×2 IMPLANT
SHEATH GLIDE SLENDER 4/5FR (SHEATH) ×1 IMPLANT
SHEATH PROBE COVER 6X72 (BAG) ×1 IMPLANT
TRANSDUCER W/STOPCOCK (MISCELLANEOUS) ×2 IMPLANT
TUBING CIL FLEX 10 FLL-RA (TUBING) ×2 IMPLANT

## 2019-03-16 NOTE — Care Management Important Message (Signed)
Important Message  Patient Details  Name: Loran Auguste MRN: [id number] Date of Birth: 08-01-1945   Medicare Important Message Given:  Yes     Memory Argue 03/16/2019, 1:47 PM   PATIENT  HAS SIGNED IM

## 2019-03-16 NOTE — CV Procedure (Signed)
   Right and left heart cath via right antecubital vein and radial artery.  Access for both achieved using real-time vascular ultrasound.  Normal coronary arteries.  Elevated pulmonary capillary wedge 23 mmHg with V wave up to 33 mmHg.  Mild to moderate pulmonary hypertension.  LVEDP 24 mmHg.  Contrast ventriculography was not performed.  No complications.

## 2019-03-16 NOTE — Interval H&P Note (Signed)
Cath Lab Visit (complete for each Cath Lab visit)  Clinical Evaluation Leading to the Procedure:   ACS: Yes.    Non-ACS:    Anginal Classification: CCS III  Anti-ischemic medical therapy: Minimal Therapy (1 class of medications)  Non-Invasive Test Results: High-risk stress test findings: cardiac mortality >3%/year  Prior CABG: No previous CABG      History and Physical Interval Note:  03/16/2019 2:53 PM  Ann Bernard  has presented today for surgery, with the diagnosis of heart failure.  The various methods of treatment have been discussed with the patient and family. After consideration of risks, benefits and other options for treatment, the patient has consented to  Procedure(s): RIGHT/LEFT HEART CATH AND CORONARY ANGIOGRAPHY (N/A) as a surgical intervention.  The patient's history has been reviewed, patient examined, no change in status, stable for surgery.  I have reviewed the patient's chart and labs.  Questions were answered to the patient's satisfaction.     Belva Crome III

## 2019-03-16 NOTE — Progress Notes (Signed)
  Echocardiogram 2D Echocardiogram has been performed.  Ann Bernard L Androw 03/16/2019, 9:29 AM

## 2019-03-16 NOTE — Progress Notes (Signed)
PROGRESS NOTE    Ann LinseyBettye Oriordan  AOZ:[id number] DOB: 09-Oct-1945 DOA: 03/14/2019 PCP: Dorthula MatasJudd, Donna J, PA-C   Brief Narrative:  Ann Bernard  is a 73 y.o. female, with past medical history significant for hypertension and hyperlipidemia, presenting today with 3 weeks history of worsening chest heaviness with epigastric discomfort associated with nausea, diaphoresis and shortness of breath.  It started on exertion and now is continuing at rest .  No history of fever chills diarrhea.  No history of PND's or orthopnea.  Work-up in the emergency room showed chronic LBBB and elevated troponin.  Her CBC and BMP were benign.  Patient was admitted under hospitalist service and cardiology was consulted. High-sensitivity troponin 22 >> 60 >> 493 >> 478.  Assessment & Plan:   Active Problems:   Acute CHF (congestive heart failure) (HCC)   Acute coronary syndrome (HCC)  Chest pain/?acute coronary syndrome , EKG shows LBBB: High-sensitivity troponin 22 >> 60 >> 493 >> 478.  No more chest pain today.  On heparin drip.  Plan for cardiac cath today.  Appreciate cardiology help.  Acute hypoxic respiratory failure secondary to acute pulmonary edema/acute systolic congestive heart failure: Patient required BiPAP placement at initial presentation to ED.  No history of CHF in the past.  Echo today shows ejection fraction less than 20%.  She is comfortable on room air currently with no chest pain or shortness of breath.  Received some diuresis yesterday.  Cardiology on board, defer management to them.  History of hypertension: Only on losartan at home.  This is on hold and despite of that her blood pressures within normal limits.  Monitor for now.  Hyperlipidemia: Continue Zocor  GERD:  Continue with Pepcid  DVT prophylaxis: Heparin drip Code Status: Full code Family Communication:  None present at bedside.  Plan of care discussed with patient in length and he verbalized understanding and agreed with  it. Disposition Plan: TBD  Estimated body mass index is 31.32 kg/m as calculated from the following:   Height as of this encounter: 5\' 7"  (1.702 m).   Weight as of this encounter: 90.7 kg.      Nutritional status:               Consultants:   Cardiology  Procedures:   Cardiac cath today  Antimicrobials:   None   Subjective: Patient seen and examined before cardiac cath.  She had no complaint of chest pain or shortness of breath and she was comfortable on room air.  Objective: Vitals:   03/15/19 2352 03/16/19 0317 03/16/19 0723 03/16/19 1230  BP: 106/79 107/62 113/74 123/79  Pulse: 71 75 91 89  Resp: 16 16 18 18   Temp: 98 F (36.7 C) (!) 97.5 F (36.4 C) 97.9 F (36.6 C) 98 F (36.7 C)  TempSrc: Oral Oral Oral Oral  SpO2: 99% 99% 99% 97%  Weight:      Height:        Intake/Output Summary (Last 24 hours) at 03/16/2019 1301 Last data filed at 03/16/2019 0000 Gross per 24 hour  Intake 152.01 ml  Output 375 ml  Net -222.99 ml   Filed Weights   03/14/19 1715  Weight: 90.7 kg    Examination:  General exam: Appears calm and comfortable  Respiratory system: Clear to auscultation. Respiratory effort normal. Cardiovascular system: S1 & S2 heard, RRR. No JVD, murmurs, rubs, gallops or clicks. No pedal edema. Gastrointestinal system: Abdomen is nondistended, soft and nontender. No organomegaly or masses felt. Normal bowel  sounds heard. Central nervous system: Alert and oriented. No focal neurological deficits. Extremities: Symmetric 5 x 5 power. Skin: No rashes, lesions or ulcers Psychiatry: Judgement and insight appear normal. Mood & affect appropriate.    Data Reviewed: I have personally reviewed following labs and imaging studies  CBC: Recent Labs  Lab 03/14/19 1600 03/16/19 0251  WBC 7.2 6.5  NEUTROABS 3.7  --   HGB 15.9* 14.4  HCT 51.0* 43.4  MCV 101.4* 98.2  PLT 207 161   Basic Metabolic Panel: Recent Labs  Lab 03/14/19 1600  03/16/19 0251  NA 142 142  K 3.7 3.5  CL 109 107  CO2 23 25  GLUCOSE 124* 105*  BUN 16 15  CREATININE 0.86 0.85  CALCIUM 9.3 8.9   GFR: Estimated Creatinine Clearance: 68.1 mL/min (by C-G formula based on SCr of 0.85 mg/dL). Liver Function Tests: Recent Labs  Lab 03/14/19 1600  AST 24  ALT 38  ALKPHOS 59  BILITOT 0.8  PROT 7.3  ALBUMIN 4.1   No results for input(s): LIPASE, AMYLASE in the last 168 hours. No results for input(s): AMMONIA in the last 168 hours. Coagulation Profile: No results for input(s): INR, PROTIME in the last 168 hours. Cardiac Enzymes: No results for input(s): CKTOTAL, CKMB, CKMBINDEX, TROPONINI in the last 168 hours. BNP (last 3 results) No results for input(s): PROBNP in the last 8760 hours. HbA1C: Recent Labs    03/16/19 0251  HGBA1C 5.5   CBG: No results for input(s): GLUCAP in the last 168 hours. Lipid Profile: Recent Labs    03/16/19 0251  CHOL 194  HDL 53  LDLCALC 128*  TRIG 65  CHOLHDL 3.7   Thyroid Function Tests: No results for input(s): TSH, T4TOTAL, FREET4, T3FREE, THYROIDAB in the last 72 hours. Anemia Panel: No results for input(s): VITAMINB12, FOLATE, FERRITIN, TIBC, IRON, RETICCTPCT in the last 72 hours. Sepsis Labs: No results for input(s): PROCALCITON, LATICACIDVEN in the last 168 hours.  Recent Results (from the past 240 hour(s))  SARS Coronavirus 2 by RT PCR (hospital order, performed in [facility] hospital lab) Nasopharyngeal Nasopharyngeal Swab     Status: None   Collection Time: 03/14/19  5:19 PM   Specimen: Nasopharyngeal Swab  Result Value Ref Range Status   SARS Coronavirus 2 NEGATIVE NEGATIVE Final    Comment: (NOTE) If result is NEGATIVE SARS-CoV-2 target nucleic acids are NOT DETECTED. The SARS-CoV-2 RNA is generally detectable in upper and lower  respiratory specimens during the acute phase of infection. The lowest  concentration of SARS-CoV-2 viral copies this assay can detect is 250  copies /  mL. A negative result does not preclude SARS-CoV-2 infection  and should not be used as the sole basis for treatment or other  patient management decisions.  A negative result may occur with  improper specimen collection / handling, submission of specimen other  than nasopharyngeal swab, presence of viral mutation(s) within the  areas targeted by this assay, and inadequate number of viral copies  (<250 copies / mL). A negative result must be combined with clinical  observations, patient history, and epidemiological information. If result is POSITIVE SARS-CoV-2 target nucleic acids are DETECTED. The SARS-CoV-2 RNA is generally detectable in upper and lower  respiratory specimens dur ing the acute phase of infection.  Positive  results are indicative of active infection with SARS-CoV-2.  Clinical  correlation with patient history and other diagnostic information is  necessary to determine patient infection status.  Positive results do  not  rule out bacterial infection or co-infection with other viruses. If result is PRESUMPTIVE POSTIVE SARS-CoV-2 nucleic acids MAY BE PRESENT.   A presumptive positive result was obtained on the submitted specimen  and confirmed on repeat testing.  While 2019 novel coronavirus  (SARS-CoV-2) nucleic acids may be present in the submitted sample  additional confirmatory testing may be necessary for epidemiological  and / or clinical management purposes  to differentiate between  SARS-CoV-2 and other Sarbecovirus currently known to infect humans.  If clinically indicated additional testing with an alternate test  methodology [phone]) is advised. The SARS-CoV-2 RNA is generally  detectable in upper and lower respiratory sp ecimens during the acute  phase of infection. The expected result is Negative. Fact Sheet for Patients:  StrictlyIdeas.no Fact Sheet for Healthcare Providers: BankingDealers.co.za This test is  not yet approved or cleared by the [place] FDA and has been authorized for detection and/or diagnosis of SARS-CoV-2 by FDA under an Emergency Use Authorization (EUA).  This EUA will remain in effect (meaning this test can be used) for the duration of the COVID-19 declaration under Section 564(b)(1) of the Act, 21 U.S.C. section 360bbb-3(b)(1), unless the authorization is terminated or revoked sooner. Performed at [facility], [address] [place], [place] [address]   Blood Culture (routine x 2)     Status: None (Preliminary result)   Collection Time: 03/14/19  9:40 PM   Specimen: BLOOD  Result Value Ref Range Status   Specimen Description   Final    BLOOD LEFT ANTECUBITAL Performed at [facility], [address] [place], [place] [address]    Special Requests   Final    BOTTLES DRAWN AEROBIC AND ANAEROBIC Blood Culture adequate volume Performed at [facility], [address] [place], [place] [address]    Culture   Final    NO GROWTH < 24 HOURS Performed at [facility], [place] [address], [place], [place] [address]    Report Status PENDING  Incomplete  Blood Culture (routine x 2)     Status: None (Preliminary result)   Collection Time: 03/14/19  9:40 PM   Specimen: BLOOD RIGHT WRIST  Result Value Ref Range Status   Specimen Description   Final    BLOOD RIGHT WRIST Performed at [facility], [address] [place], [place] [address]    Special Requests   Final    BOTTLES DRAWN AEROBIC AND ANAEROBIC Blood Culture adequate volume Performed at [facility], [address] [place], [place] [address]    Culture   Final    NO GROWTH < 24 HOURS Performed at [facility], [place] [address] [place], [place] [address]    Report Status PENDING  Incomplete  MRSA PCR Screening     Status: None   Collection Time: 03/15/19 10:08 PM   Specimen: Nasopharyngeal  Result Value Ref Range Status   MRSA by PCR  NEGATIVE NEGATIVE Final    Comment:        The GeneXpert MRSA Assay (FDA approved for NASAL specimens only), is one component of a comprehensive MRSA colonization surveillance program. It is not intended to diagnose MRSA infection nor to guide or monitor treatment for MRSA infections. Performed at [facility], [place] [address], [place], [place] [address]       Radiology Studies: Ct Angio Chest Pe W And/or Wo Contrast  Result Date: 03/14/2019 CLINICAL  DATA:  Shortness of breath, chest pain EXAM: CT ANGIOGRAPHY CHEST WITH CONTRAST TECHNIQUE: Multidetector CT imaging of the chest was performed using the standard protocol during bolus administration of intravenous contrast. Multiplanar CT image reconstructions and MIPs were obtained to evaluate the vascular anatomy. CONTRAST:  51mL OMNIPAQUE IOHEXOL 350 MG/ML SOLN COMPARISON:  None. FINDINGS: Cardiovascular: Satisfactory opacification of the pulmonary arteries to the segmental level. No evidence of pulmonary embolism. Cardiomegaly. Scattered left coronary artery calcifications. Trace pericardial effusion. Scattered aortic atherosclerosis. Mediastinum/Nodes: No enlarged mediastinal, hilar, or axillary lymph nodes. Thyroid gland, trachea, and esophagus demonstrate no significant findings. Lungs/Pleura: Mild centrilobular emphysema. Heterogeneous and ground-glass opacities, most conspicuous in the dependent right lower lobe. Diffuse bilateral bronchial wall thickening. Small, right greater than left pleural effusions. Upper Abdomen: No acute abnormality. Musculoskeletal: No chest wall abnormality. No acute or significant osseous findings. Review of the MIP images confirms the above findings. IMPRESSION: 1.  Negative examination for pulmonary embolism. 2. Heterogeneous and ground-glass opacities, most conspicuous in the dependent right lower lobe. Findings are most consistent with infection, however edema could have this asymmetric appearance. 3.   Small bilateral pleural effusions. 4.  Diffuse bilateral bronchial wall thickening, nonspecific. 5.  Cardiomegaly and coronary artery disease. 6.  Trace, nonspecific pericardial effusion. 7.  Aortic Atherosclerosis (ICD10-I70.0). Electronically Signed   By: Lauralyn Primes M.D.   On: 03/14/2019 20:59   Dg Chest Portable 1 View  Result Date: 03/14/2019 CLINICAL DATA:  Chest pain, shortness of breath EXAM: PORTABLE CHEST 1 VIEW COMPARISON:  05/07/2015 FINDINGS: Bilateral diffuse interstitial thickening. No pleural effusion or pneumothorax. Stable cardiomegaly. No acute osseous abnormality. IMPRESSION: Bilateral interstitial thickening and cardiomegaly most concerning for mild CHF versus interstitial infection. Electronically Signed   By: Elige Ko   On: 03/14/2019 16:45    Scheduled Meds:  atorvastatin  40 mg Oral q1800   famotidine  10 mg Oral Daily   sodium chloride flush  3 mL Intravenous Q12H   Continuous Infusions:  sodium chloride Stopped (03/15/19 0226)   sodium chloride     heparin 900 Units/hr (03/16/19 1223)     LOS: 2 days   Time spent: 34 minutes   Hughie Closs, MD [facility]  03/16/2019, 1:01 PM   To contact the attending provider between 7A-7P or the covering provider during after hours 7P-7A, please log into the web site [email or web address] and use password [email or web address].

## 2019-03-16 NOTE — Progress Notes (Signed)
Responded to spiritual Care consult to assist patient with AD.  Patient had no knowledge of what and AD was.  I explained to patient and son.  Left document with patient and son to review and to talk over with family. Gave instructions on how to proceed.  Chaplain available as needed.  Jaclynn Major, [place], [facility], Pager [phone]

## 2019-03-16 NOTE — H&P (View-Only) (Signed)
 Progress Note  Patient Name: Ann Bernard Date of Encounter: 03/16/2019  Primary Cardiologist:   No primary care provider on file.   Subjective   No pain.  No SOB.    Inpatient Medications    Scheduled Meds: . famotidine  10 mg Oral Daily  . furosemide  40 mg Intravenous Daily  . pneumococcal 23 valent vaccine  0.5 mL Intramuscular Tomorrow-1000  . pravastatin  40 mg Oral Daily  . sodium chloride flush  3 mL Intravenous Q12H   Continuous Infusions: . sodium chloride Stopped (03/15/19 0226)  . sodium chloride    . heparin 900 Units/hr (03/15/19 1848)   PRN Meds: sodium chloride, sodium chloride, morphine injection, ondansetron **OR** ondansetron (ZOFRAN) IV, oxyCODONE, sodium chloride flush, zolpidem   Vital Signs    Vitals:   03/15/19 2016 03/15/19 2352 03/16/19 0317 03/16/19 0723  BP:  106/79 107/62 113/74  Pulse: 99 71 75 91  Resp: 18 16 16 18  Temp:  98 F (36.7 C) (!) 97.5 F (36.4 C) 97.9 F (36.6 C)  TempSrc:  Oral Oral Oral  SpO2: 96% 99% 99% 99%  Weight:      Height:        Intake/Output Summary (Last 24 hours) at 03/16/2019 0734 Last data filed at 03/16/2019 0000 Gross per 24 hour  Intake 152.01 ml  Output 500 ml  Net -347.99 ml   Filed Weights   03/14/19 1715  Weight: 90.7 kg    Telemetry    NSR - Personally Reviewed  ECG    NA - Personally Reviewed  Physical Exam   GEN: No acute distress.   Neck: No  JVD Cardiac: RRR, no murmurs, rubs, or gallops.  Respiratory: Clear to auscultation bilaterally. GI: Soft, nontender, non-distended  MS: No  edema; No deformity. Neuro:  Nonfocal  Psych: Normal affect   Labs    Chemistry Recent Labs  Lab 03/14/19 1600 03/16/19 0251  NA 142 142  K 3.7 3.5  CL 109 107  CO2 23 25  GLUCOSE 124* 105*  BUN 16 15  CREATININE 0.86 0.85  CALCIUM 9.3 8.9  PROT 7.3  --   ALBUMIN 4.1  --   AST 24  --   ALT 38  --   ALKPHOS 59  --   BILITOT 0.8  --   GFRNONAA >60 >60  GFRAA >60 >60   ANIONGAP 10 10     Hematology Recent Labs  Lab 03/14/19 1600 03/16/19 0251  WBC 7.2 6.5  RBC 5.03 4.42  HGB 15.9* 14.4  HCT 51.0* 43.4  MCV 101.4* 98.2  MCH 31.6 32.6  MCHC 31.2 33.2  RDW 13.5 13.2  PLT 207 161    Cardiac EnzymesNo results for input(s): TROPONINI in the last 168 hours. No results for input(s): TROPIPOC in the last 168 hours.   BNP Recent Labs  Lab 03/14/19 1600  BNP 1,035.7*     DDimer  Recent Labs  Lab 03/14/19 1600  DDIMER 2.03*     Radiology    Ct Angio Chest Pe W And/or Wo Contrast  Result Date: 03/14/2019 CLINICAL DATA:  Shortness of breath, chest pain EXAM: CT ANGIOGRAPHY CHEST WITH CONTRAST TECHNIQUE: Multidetector CT imaging of the chest was performed using the standard protocol during bolus administration of intravenous contrast. Multiplanar CT image reconstructions and MIPs were obtained to evaluate the vascular anatomy. CONTRAST:  81mL OMNIPAQUE IOHEXOL 350 MG/ML SOLN COMPARISON:  None. FINDINGS: Cardiovascular: Satisfactory opacification of the pulmonary arteries to the   segmental level. No evidence of pulmonary embolism. Cardiomegaly. Scattered left coronary artery calcifications. Trace pericardial effusion. Scattered aortic atherosclerosis. Mediastinum/Nodes: No enlarged mediastinal, hilar, or axillary lymph nodes. Thyroid gland, trachea, and esophagus demonstrate no significant findings. Lungs/Pleura: Mild centrilobular emphysema. Heterogeneous and ground-glass opacities, most conspicuous in the dependent right lower lobe. Diffuse bilateral bronchial wall thickening. Small, right greater than left pleural effusions. Upper Abdomen: No acute abnormality. Musculoskeletal: No chest wall abnormality. No acute or significant osseous findings. Review of the MIP images confirms the above findings. IMPRESSION: 1.  Negative examination for pulmonary embolism. 2. Heterogeneous and ground-glass opacities, most conspicuous in the dependent right lower lobe.  Findings are most consistent with infection, however edema could have this asymmetric appearance. 3.  Small bilateral pleural effusions. 4.  Diffuse bilateral bronchial wall thickening, nonspecific. 5.  Cardiomegaly and coronary artery disease. 6.  Trace, nonspecific pericardial effusion. 7.  Aortic Atherosclerosis (ICD10-I70.0). Electronically Signed   By: Eddie Candle M.D.   On: 03/14/2019 20:59   Dg Chest Portable 1 View  Result Date: 03/14/2019 CLINICAL DATA:  Chest pain, shortness of breath EXAM: PORTABLE CHEST 1 VIEW COMPARISON:  05/07/2015 FINDINGS: Bilateral diffuse interstitial thickening. No pleural effusion or pneumothorax. Stable cardiomegaly. No acute osseous abnormality. IMPRESSION: Bilateral interstitial thickening and cardiomegaly most concerning for mild CHF versus interstitial infection. Electronically Signed   By: Kathreen Devoid   On: 03/14/2019 16:45    Cardiac Studies   Echo Pending  Patient Profile     73 y.o. female with a history of hypertension, hyperlipidemia, and tobacco use but no known cardiac history who is being seen for the evaluation of acute CHF at the request of Dr. Laren Everts.  Assessment & Plan    Newly Diagnosed Acute CHF - Type Unknown Hold Lasix for this morning in anticipation of cath.  Resume ACE/ARB/ERNI post cath.  Echo pending.   Net negative 1219 currently.   She has been on Losartan at home.  Currently on hold.   Chest Pain: NSTEMI vs. Demand Ischemia Right and left heart cath today.  The patient understands that risks included but are not limited to stroke (1 in 1000), death (1 in 22), kidney failure [usually temporary] (1 in 500), bleeding (1 in 200), allergic reaction [possibly serious] (1 in 200).  The patient understands and agrees to proceed.   Hypertension BP well controlled.  Will treated in the context of treating her HF.   Hyperlipidemia LDL elevated.  Will start Lipitor.  Discontinue Pravachol.  .      For questions or updates,  please contact [place] Please consult [email or web address] for contact info under Cardiology/STEMI.   Signed, Minus Breeding, MD  03/16/2019, 7:34 AM

## 2019-03-16 NOTE — Progress Notes (Signed)
Ann Bernard for Heparin Indication: CP/elevated troponin   Allergies  Allergen Reactions  . Aspirin Nausea Only and Other (See Comments)    GI Upset, stomach cramps     Patient Measurements: Height: 5\' 7"  (170.2 cm) Weight: 199 lb 15.3 oz (90.7 kg) IBW/kg (Calculated) : 61.6 Heparin Dosing Weight: 81 kg  Vital Signs: Temp: 97.9 F (36.6 C) (10/27 0723) Temp Source: Oral (10/27 0723) BP: 113/74 (10/27 0723) Pulse Rate: 91 (10/27 0723)  Labs: Recent Labs    03/14/19 1600 03/14/19 1856 03/14/19 2253 03/15/19 0115 03/15/19 0330 03/15/19 1658 03/16/19 0251  HGB 15.9*  --   --   --   --   --  14.4  HCT 51.0*  --   --   --   --   --  43.4  PLT 207  --   --   --   --   --  161  HEPARINUNFRC  --   --   --   --  1.26* 0.71* 0.45  CREATININE 0.86  --   --   --   --   --  0.85  TROPONINIHS 22* 60* 493* 478*  --   --   --     Estimated Creatinine Clearance: 68.1 mL/min (by C-G formula based on SCr of 0.85 mg/dL).   Medical History: Past Medical History:  Diagnosis Date  . Coronary artery disease   . Hypertension     Medications:  Infusions:  . sodium chloride Stopped (03/15/19 0226)  . sodium chloride    . heparin 900 Units/hr (03/15/19 1848)    Assessment: Patient is a 73 yr old female with a hx of CHF who presents to the ED with chest pain and shortness of breath. Patient was having a COPD exacerbation currently preventing her from getting a CT-PE at that time; chest CTA today was negative for PE. Current differential dx is true NSTEMI vs demand ischemia due to acute CHF. Echo and possible cath planned for tomorrow.  Heparin level 0.41 at goal on heparin drip rate 900 uts/hr.  Per RN, no issues with IV or bleeding observed.  H/H stable, pltc stable  Plan cath today - f/u after for new medication needs New EF < 20%    Goal of Therapy:  Heparin level 0.3-0.7 units/ml Monitor platelets by anticoagulation protocol: Yes    Plan:  heparin infusion 900 units/hr Monitor CBC, HL daily Monitor for signs/symptoms of bleeding  Bonnita Nasuti Pharm.D. CPP, BCPS Clinical Pharmacist [phone] 03/16/2019 12:02 PM

## 2019-03-16 NOTE — Progress Notes (Signed)
Progress Note  Patient Name: Ann Bernard Date of Encounter: 03/16/2019  Primary Cardiologist:   No primary care provider on file.   Subjective   No pain.  No SOB.    Inpatient Medications    Scheduled Meds: . famotidine  10 mg Oral Daily  . furosemide  40 mg Intravenous Daily  . pneumococcal 23 valent vaccine  0.5 mL Intramuscular Tomorrow-1000  . pravastatin  40 mg Oral Daily  . sodium chloride flush  3 mL Intravenous Q12H   Continuous Infusions: . sodium chloride Stopped (03/15/19 0226)  . sodium chloride    . heparin 900 Units/hr (03/15/19 1848)   PRN Meds: sodium chloride, sodium chloride, morphine injection, ondansetron **OR** ondansetron (ZOFRAN) IV, oxyCODONE, sodium chloride flush, zolpidem   Vital Signs    Vitals:   03/15/19 2016 03/15/19 2352 03/16/19 0317 03/16/19 0723  BP:  106/79 107/62 113/74  Pulse: 99 71 75 91  Resp: 18 16 16 18   Temp:  98 F (36.7 C) (!) 97.5 F (36.4 C) 97.9 F (36.6 C)  TempSrc:  Oral Oral Oral  SpO2: 96% 99% 99% 99%  Weight:      Height:        Intake/Output Summary (Last 24 hours) at 03/16/2019 0734 Last data filed at 03/16/2019 0000 Gross per 24 hour  Intake 152.01 ml  Output 500 ml  Net -347.99 ml   Filed Weights   03/14/19 1715  Weight: 90.7 kg    Telemetry    NSR - Personally Reviewed  ECG    NA - Personally Reviewed  Physical Exam   GEN: No acute distress.   Neck: No  JVD Cardiac: RRR, no murmurs, rubs, or gallops.  Respiratory: Clear to auscultation bilaterally. GI: Soft, nontender, non-distended  MS: No  edema; No deformity. Neuro:  Nonfocal  Psych: Normal affect   Labs    Chemistry Recent Labs  Lab 03/14/19 1600 03/16/19 0251  NA 142 142  K 3.7 3.5  CL 109 107  CO2 23 25  GLUCOSE 124* 105*  BUN 16 15  CREATININE 0.86 0.85  CALCIUM 9.3 8.9  PROT 7.3  --   ALBUMIN 4.1  --   AST 24  --   ALT 38  --   ALKPHOS 59  --   BILITOT 0.8  --   GFRNONAA >60 >60  GFRAA >60 >60   ANIONGAP 10 10     Hematology Recent Labs  Lab 03/14/19 1600 03/16/19 0251  WBC 7.2 6.5  RBC 5.03 4.42  HGB 15.9* 14.4  HCT 51.0* 43.4  MCV 101.4* 98.2  MCH 31.6 32.6  MCHC 31.2 33.2  RDW 13.5 13.2  PLT 207 161    Cardiac EnzymesNo results for input(s): TROPONINI in the last 168 hours. No results for input(s): TROPIPOC in the last 168 hours.   BNP Recent Labs  Lab 03/14/19 1600  BNP 1,035.7*     DDimer  Recent Labs  Lab 03/14/19 1600  DDIMER 2.03*     Radiology    Ct Angio Chest Pe W And/or Wo Contrast  Result Date: 03/14/2019 CLINICAL DATA:  Shortness of breath, chest pain EXAM: CT ANGIOGRAPHY CHEST WITH CONTRAST TECHNIQUE: Multidetector CT imaging of the chest was performed using the standard protocol during bolus administration of intravenous contrast. Multiplanar CT image reconstructions and MIPs were obtained to evaluate the vascular anatomy. CONTRAST:  60mL OMNIPAQUE IOHEXOL 350 MG/ML SOLN COMPARISON:  None. FINDINGS: Cardiovascular: Satisfactory opacification of the pulmonary arteries to the  segmental level. No evidence of pulmonary embolism. Cardiomegaly. Scattered left coronary artery calcifications. Trace pericardial effusion. Scattered aortic atherosclerosis. Mediastinum/Nodes: No enlarged mediastinal, hilar, or axillary lymph nodes. Thyroid gland, trachea, and esophagus demonstrate no significant findings. Lungs/Pleura: Mild centrilobular emphysema. Heterogeneous and ground-glass opacities, most conspicuous in the dependent right lower lobe. Diffuse bilateral bronchial wall thickening. Small, right greater than left pleural effusions. Upper Abdomen: No acute abnormality. Musculoskeletal: No chest wall abnormality. No acute or significant osseous findings. Review of the MIP images confirms the above findings. IMPRESSION: 1.  Negative examination for pulmonary embolism. 2. Heterogeneous and ground-glass opacities, most conspicuous in the dependent right lower lobe.  Findings are most consistent with infection, however edema could have this asymmetric appearance. 3.  Small bilateral pleural effusions. 4.  Diffuse bilateral bronchial wall thickening, nonspecific. 5.  Cardiomegaly and coronary artery disease. 6.  Trace, nonspecific pericardial effusion. 7.  Aortic Atherosclerosis (ICD10-I70.0). Electronically Signed   By: Eddie Candle M.D.   On: 03/14/2019 20:59   Dg Chest Portable 1 View  Result Date: 03/14/2019 CLINICAL DATA:  Chest pain, shortness of breath EXAM: PORTABLE CHEST 1 VIEW COMPARISON:  05/07/2015 FINDINGS: Bilateral diffuse interstitial thickening. No pleural effusion or pneumothorax. Stable cardiomegaly. No acute osseous abnormality. IMPRESSION: Bilateral interstitial thickening and cardiomegaly most concerning for mild CHF versus interstitial infection. Electronically Signed   By: Kathreen Devoid   On: 03/14/2019 16:45    Cardiac Studies   Echo Pending  Patient Profile     73 y.o. female with a history of hypertension, hyperlipidemia, and tobacco use but no known cardiac history who is being seen for the evaluation of acute CHF at the request of Dr. Laren Everts.  Assessment & Plan    Newly Diagnosed Acute CHF - Type Unknown Hold Lasix for this morning in anticipation of cath.  Resume ACE/ARB/ERNI post cath.  Echo pending.   Net negative 1219 currently.   She has been on Losartan at home.  Currently on hold.   Chest Pain: NSTEMI vs. Demand Ischemia Right and left heart cath today.  The patient understands that risks included but are not limited to stroke (1 in 1000), death (1 in 22), kidney failure [usually temporary] (1 in 500), bleeding (1 in 200), allergic reaction [possibly serious] (1 in 200).  The patient understands and agrees to proceed.   Hypertension BP well controlled.  Will treated in the context of treating her HF.   Hyperlipidemia LDL elevated.  Will start Lipitor.  Discontinue Pravachol.  .      For questions or updates,  please contact [place] Please consult [email or web address] for contact info under Cardiology/STEMI.   Signed, Minus Breeding, MD  03/16/2019, 7:34 AM

## 2019-03-16 NOTE — Progress Notes (Signed)
This note also relates to the following rows which could not be included: SpO2 - Cannot attach notes to unvalidated device data  Pt on the Phone ordering her food for dinner

## 2019-03-17 ENCOUNTER — Encounter (HOSPITAL_COMMUNITY): Payer: Self-pay | Admitting: Interventional Cardiology

## 2019-03-17 DIAGNOSIS — I428 Other cardiomyopathies: Secondary | ICD-10-CM

## 2019-03-17 DIAGNOSIS — I10 Essential (primary) hypertension: Secondary | ICD-10-CM

## 2019-03-17 DIAGNOSIS — I5021 Acute systolic (congestive) heart failure: Secondary | ICD-10-CM

## 2019-03-17 LAB — BASIC METABOLIC PANEL
Anion gap: 10 (ref 5–15)
BUN: 14 mg/dL (ref 8–23)
CO2: 25 mmol/L (ref 22–32)
Calcium: 9 mg/dL (ref 8.9–10.3)
Chloride: 106 mmol/L (ref 98–111)
Creatinine, Ser: 0.77 mg/dL (ref 0.44–1.00)
GFR calc Af Amer: >60 mL/min (ref >60)
GFR calc non Af Amer: >60 mL/min (ref >60)
Glucose, Bld: 97 mg/dL (ref 70–99)
Potassium: 3.3 mmol/L — ABNORMAL LOW (ref 3.5–5.1)
Sodium: 141 mmol/L (ref 135–145)

## 2019-03-17 LAB — CBC
HCT: 44 % (ref 36.0–46.0)
Hemoglobin: 14.3 g/dL (ref 12.0–15.0)
MCH: 32.3 pg (ref 26.0–34.0)
MCHC: 32.5 g/dL (ref 30.0–36.0)
MCV: 99.3 fL (ref 80.0–100.0)
Platelets: 150 10*3/uL (ref 150–400)
RBC: 4.43 MIL/uL (ref 3.87–5.11)
RDW: 13.3 % (ref 11.5–15.5)
WBC: 6.3 10*3/uL (ref 4.0–10.5)
nRBC: 0 % (ref 0.0–0.2)

## 2019-03-17 LAB — TSH: TSH: 2.833 u[IU]/mL (ref 0.350–4.500)

## 2019-03-17 MED ORDER — DIGOXIN 125 MCG PO TABS
0.1250 mg | ORAL_TABLET | Freq: Every day | ORAL | Status: DC
  Administered 2019-03-17 – 2019-03-18 (×2): 0.125 mg via ORAL
  Filled 2019-03-17 (×2): qty 1

## 2019-03-17 MED ORDER — FUROSEMIDE 10 MG/ML IJ SOLN
40.0000 mg | Freq: Two times a day (BID) | INTRAMUSCULAR | Status: DC
  Administered 2019-03-17 – 2019-03-18 (×3): 40 mg via INTRAVENOUS
  Filled 2019-03-17 (×3): qty 4

## 2019-03-17 MED ORDER — SACUBITRIL-VALSARTAN 24-26 MG PO TABS
1.0000 | ORAL_TABLET | Freq: Two times a day (BID) | ORAL | Status: DC
  Administered 2019-03-17 – 2019-03-18 (×3): 1 via ORAL
  Filled 2019-03-17 (×3): qty 1

## 2019-03-17 MED ORDER — CARVEDILOL 3.125 MG PO TABS
3.1250 mg | ORAL_TABLET | Freq: Two times a day (BID) | ORAL | Status: DC
  Administered 2019-03-17 – 2019-03-18 (×2): 3.125 mg via ORAL
  Filled 2019-03-17 (×2): qty 1

## 2019-03-17 MED ORDER — POTASSIUM CHLORIDE CRYS ER 20 MEQ PO TBCR
40.0000 meq | EXTENDED_RELEASE_TABLET | Freq: Once | ORAL | Status: AC
  Administered 2019-03-17: 40 meq via ORAL
  Filled 2019-03-17: qty 2

## 2019-03-17 NOTE — TOC Benefit Eligibility Note (Signed)
Transition of Care [facility]) Benefit Eligibility Note    Patient Details  Name: Ann Bernard MRN: [id number] Date of Birth: 11-Apr-1946   Medication/Dose: Delene Loll  49/51 MG , 100 MG   , 50 MG  AND  97/103 MG BID  Covered?: Yes  Tier: 3 Drug  Prescription Coverage Preferred Pharmacy: [facility] with Person/Company/Phone Number:: TEKAYLA  @ [employer] # [phone]  Co-Pay: $8.95  Prior Approval: No  Deductible: Unmet(LOWE INCOME SUBSIDY)  Additional Notes: ENTRESTO 24/26 MG BID NOT COVER FOR [place], P/A [phone]    Memory Argue Phone Number: 03/17/2019, 3:06 PM

## 2019-03-17 NOTE — Progress Notes (Addendum)
Progress Note  Patient Name: Ann Bernard Date of Encounter: 03/17/2019  Primary Cardiologist:   No primary care provider on file.   Subjective   No chest pain.  No SOB.  She is having some left flank pain that is not associated with fever, chills, change in urine.  Seems to be positional.   Inpatient Medications    Scheduled Meds: . atorvastatin  40 mg Oral q1800  . famotidine  10 mg Oral Daily  . heparin  5,000 Units Subcutaneous Q8H  . sodium chloride flush  3 mL Intravenous Q12H  . sodium chloride flush  3 mL Intravenous Q12H  . sodium chloride flush  3 mL Intravenous Q12H   Continuous Infusions: . sodium chloride Stopped (03/15/19 0226)  . sodium chloride    . sodium chloride    . sodium chloride     PRN Meds: sodium chloride, sodium chloride, sodium chloride, sodium chloride, acetaminophen, morphine injection, ondansetron **OR** ondansetron (ZOFRAN) IV, oxyCODONE, sodium chloride flush, sodium chloride flush, sodium chloride flush, zolpidem   Vital Signs    Vitals:   03/16/19 1943 03/16/19 2309 03/17/19 0333 03/17/19 0748  BP: (!) 111/97 111/85 125/81 121/77  Pulse: 95  87 86  Resp: 17  15 (!) 24  Temp: 98.1 F (36.7 C) 97.6 F (36.4 C) 97.7 F (36.5 C) 98.2 F (36.8 C)  TempSrc: Oral Oral Oral Oral  SpO2: 96%  97% 94%  Weight:      Height:        Intake/Output Summary (Last 24 hours) at 03/17/2019 0816 Last data filed at 03/16/2019 1750 Gross per 24 hour  Intake 237.03 ml  Output -  Net 237.03 ml   Filed Weights   03/14/19 1715  Weight: 90.7 kg    Telemetry    NSR - Personally Reviewed  ECG    NA - Personally Reviewed  Physical Exam   GEN: No  acute distress.   Neck: No  JVD Cardiac: RRR, no murmurs, rubs, or gallops.  Respiratory: Clear   to auscultation bilaterally. GI: Soft, nontender, non-distended, normal bowel sounds  MS:  Nop edema; No deformity.  Right radial site OK.  Neuro:   Nonfocal  Psych: Oriented and appropriate     Labs    Chemistry Recent Labs  Lab 03/14/19 1600 03/16/19 0251 03/16/19 1519 03/16/19 1525 03/17/19 0323  NA 142 142 143 139 141  K 3.7 3.5 3.3* 3.3* 3.3*  CL 109 107  --   --  106  CO2 23 25  --   --  25  GLUCOSE 124* 105*  --   --  97  BUN 16 15  --   --  14  CREATININE 0.86 0.85  --   --  0.77  CALCIUM 9.3 8.9  --   --  9.0  PROT 7.3  --   --   --   --   ALBUMIN 4.1  --   --   --   --   AST 24  --   --   --   --   ALT 38  --   --   --   --   ALKPHOS 59  --   --   --   --   BILITOT 0.8  --   --   --   --   GFRNONAA >60 >60  --   --  >60  GFRAA >60 >60  --   --  >60  ANIONGAP 10  10  --   --  10     Hematology Recent Labs  Lab 03/14/19 1600 03/16/19 0251 03/16/19 1519 03/16/19 1525 03/17/19 0323  WBC 7.2 6.5  --   --  6.3  RBC 5.03 4.42  --   --  4.43  HGB 15.9* 14.4 14.3 13.9 14.3  HCT 51.0* 43.4 42.0 41.0 44.0  MCV 101.4* 98.2  --   --  99.3  MCH 31.6 32.6  --   --  32.3  MCHC 31.2 33.2  --   --  32.5  RDW 13.5 13.2  --   --  13.3  PLT 207 161  --   --  150    Cardiac EnzymesNo results for input(s): TROPONINI in the last 168 hours. No results for input(s): TROPIPOC in the last 168 hours.   BNP Recent Labs  Lab 03/14/19 1600  BNP 1,035.7*     DDimer  Recent Labs  Lab 03/14/19 1600  DDIMER 2.03*     Radiology    No results found.  Cardiac Studies   Echo  1. Left ventricular ejection fraction, by visual estimation, is <20%. The left ventricle has severely decreased function. Moderately increased left ventricular size. There is no left ventricular hypertrophy. There is severe global hypokinesis. 2. Abnormal septal motion consistent with left bundle branch block. 3. Definity contrast agent was given IV to delineate the left ventricular endocardial borders. 4. There is no evidence of left ventricular thrombus (Definity used). 5. Elevated mean left atrial pressure. 6. Left ventricular diastolic Doppler parameters are consistent with  pseudonormalization pattern of LV diastolic filling. 7. Global right ventricle has mildly reduced systolic function.The right ventricular size is normal. No increase in right ventricular wall thickness. 8. Left atrial size was moderately dilated. 9. Right atrial size was normal. 10. Trivial pericardial effusion is present. 11. The mitral valve is normal in structure. Moderate mitral valve regurgitation. 12. The tricuspid valve is normal in structure. Tricuspid valve regurgitation is mild. 13. The aortic valve is normal in structure. Aortic valve regurgitation is mild to moderate by color flow Doppler. Mild aortic valve sclerosis without stenosis. 14. The pulmonic valve was grossly normal. Pulmonic valve regurgitation is not visualized by color flow Doppler. 15. Mildly elevated pulmonary artery systolic pressure. 16. The tricuspid regurgitant velocity is 3.27 m/s, and with an assumed right atrial pressure of 3 mmHg, the estimated right ventricular systolic pressure is mildly elevated at 45.8 mmHg. 17. The inferior vena cava is normal in size with greater than 50% respiratory variability, suggesting right atrial pressure of 3 mmHg.  Cath  Right dominant coronary anatomy.  Normal left main.  Widely patent LAD with each of 2 diagonals having 50% ostial narrowing.  Widely patent circumflex.  Minimal luminal irregularities in the proximal mid and distal RCA.  Elevated left ventricular end-diastolic pressure, greater than 20 mmHg.  Mild pulmonary hypertension with mean pressure 33 mmHg.  Pulmonary capillary wedge mean 23 mmHg.  V wave 33 mmHg.  Patient Profile     73 y.o. female with a history of hypertension, hyperlipidemia, and tobacco use but no known cardiac history who is being seen for the evaluation of acute CHF at the request of Dr. Sharyon Medicus.  Now found to have severely reduced EF.    Assessment & Plan    Newly Diagnosed Acute CHF -  EF severely reduced as above.  Moderate  MR.   Nonischemic. Medical management.  I/O net negative 1.136 liters.   Begin Sherryll Burger  and Coreg today.  I will hold Lasix for PRN.  We discussed this with daily weights, salt and fluid restriction.   (She could go home possibly this afternoon if she is tolerating her meds OK.  We will arrange TOC follow up.)  ADDENDUM:  Based on her exam and the appearance of the echo I suspect that this is not going to be an easily reversible cardiomyopathy and that she will need further work up of non ischemic causes and likely will need advanced therapy in the future.  I have reviewed with the HF team who will see her today.  She will need a comprehensive/team approach to follow up.   Demand Ischemia Non obstructive CAD as above.  Continue risk reduction.  Statin started.   Hypertension This is being managed in the context of treating his CHF  Hyperlipidemia LDL elevated. Lipitor started yesterday  Hypokalemia Supplement.   For questions or updates, please contact [place] Please consult [email or web address] for contact info under Cardiology/STEMI.   Signed, Minus Breeding, MD  03/17/2019, 8:16 AM

## 2019-03-17 NOTE — Consult Note (Addendum)
Advanced Heart Failure Team Consult Note   Primary Physician: Drosinis, Leonia Reader, PA-C PCP-Cardiologist:  No primary care provider on file.  Reason for Consultation: Heart Failure   HPI:    Ann Bernard is seen today for evaluation of heart failure  at the request of Dr Antoine Poche.   Ann Bernard is a 73 year old with history of HTN, hyperlipidemia, and tobacco abuse. Of note her brother has heart failure.   About 3 weeks ago she developed shortness of breath that slowly got worse. Prior to that she was able to walk around the store without difficulty. No recent illness/ virus. No history of cancer. She does not drink alcohol. Smokes about 1 pack of cigarettes per day. or smoke.   Presented to [facility] ED with increased sob and chest discomfort. Initially on Bipap but later weaned to room air. CXR was concerning for CHF versus interstitial infection. CTA negative for PE. Started on IV lasix. Pertinent admission labs included BNP 1035, HS Trop 22>60>493.  ECHO was completed and showed severely reduced EF. Diagnostic cath was performed as noted below.   Today she remains SOB talking + orthopnea.   RHC/LHC  03/16/19  Right dominant coronary anatomy.  Normal left main.  Widely patent LAD with each of 2 diagonals having 50% ostial narrowing.  Widely patent circumflex.  Minimal luminal irregularities in the proximal mid and distal RCA.  Elevated left ventricular end-diastolic pressure, greater than 20 mmHg.  Mild pulmonary hypertension with mean pressure 33 mmHg.  Pulmonary capillary wedge mean 23 mmHg.  V wave 33 mmHg. CO 4.7/CI 2.34    Echo 03/16/19    1. Left ventricular ejection fraction, by visual estimation, is <20%. The left ventricle has severely decreased function. Moderately increased left ventricular size. There is no left ventricular hypertrophy. There is severe global hypokinesis.  2. Abnormal septal motion consistent with left bundle branch block.  3. Definity contrast  agent was given IV to delineate the left ventricular endocardial borders.  4. There is no evidence of left ventricular thrombus (Definity used).  5. Elevated mean left atrial pressure.  6. Left ventricular diastolic Doppler parameters are consistent with pseudonormalization pattern of LV diastolic filling.  7. Global right ventricle has mildly reduced systolic function.The right ventricular size is normal. No increase in right ventricular wall thickness.  8. Left atrial size was moderately dilated.  9. Right atrial size was normal. 10. Trivial pericardial effusion is present. 11. The mitral valve is normal in structure. Moderate mitral valve regurgitation. 12. The tricuspid valve is normal in structure. Tricuspid valve regurgitation is mild. 13. The aortic valve is normal in structure. Aortic valve regurgitation is mild to moderate by color flow Doppler. Mild aortic valve sclerosis without stenosis. 14. The pulmonic valve was grossly normal. Pulmonic valve regurgitation is not visualized by color flow Doppler. 15. Mildly elevated pulmonary artery systolic pressure. 16. The tricuspid regurgitant velocity is 3.27 m/s, and with an assumed right atrial pressure of 3 mmHg, the estimated right ventricular systolic pressure is mildly elevated at 45.8 mmHg. 17. The inferior vena cava is normal in size with greater than 50% respiratory variability, suggesting right atrial pressure of 3 mmHg.  Review of Systems: [y] = yes, [ ]  = no   . General: Weight gain [ ] ; Weight loss [ ] ; Anorexia [ ] ; Fatigue [ Y]; Fever [ ] ; Chills [ ] ; Weakness [ ]   . Cardiac: Chest pain/pressure [ ] ; Resting SOB [ ] ; Exertional SOB [ Y];  Orthopnea [Y ]; Pedal Edema [ ] ; Palpitations [ ] ; Syncope [ ] ; Presyncope [ ] ; Paroxysmal nocturnal dyspnea[ ]   . Pulmonary: Cough [ ] ; Wheezing[ ] ; Hemoptysis[ ] ; Sputum [ ] ; Snoring [ ]   . GI: Vomiting[ ] ; Dysphagia[ ] ; Melena[ ] ; Hematochezia [ ] ; Heartburn[ ] ; Abdominal pain [ ] ;  Constipation [ ] ; Diarrhea [ ] ; BRBPR [ ]   . GU: Hematuria[ ] ; Dysuria [ ] ; Nocturia[ ]   . Vascular: Pain in legs with walking [ ] ; Pain in feet with lying flat [ ] ; Non-healing sores [ ] ; Stroke [ ] ; TIA [ ] ; Slurred speech [ ] ;  . Neuro: Headaches[ ] ; Vertigo[ ] ; Seizures[ ] ; Paresthesias[ ] ;Blurred vision [ ] ; Diplopia [ ] ; Vision changes [ ]   . Ortho/Skin: Arthritis [ ] ; Joint pain [ Y]; Muscle pain [ ] ; Joint swelling [ ] ; Back Pain [ ] ; Rash [ ]   . Psych: Depression[ ] ; Anxiety[ ]   . Heme: Bleeding problems [ ] ; Clotting disorders [ ] ; Anemia [ ]   . Endocrine: Diabetes [ ] ; Thyroid dysfunction[ ]   Home Medications Prior to Admission medications   Medication Sig Start Date End Date Taking? Authorizing Provider  alendronate (FOSAMAX) 70 MG tablet Take 70 mg by mouth once a week. 01/27/19  Yes [provider]  bisacodyl (DULCOLAX) 5 MG EC tablet Take 5 mg by mouth daily as needed for moderate constipation.   Yes [provider]  diphenhydrAMINE (BENADRYL) 25 MG tablet Take 25 mg by mouth every 6 (six) hours as needed for itching.   Yes [provider]  losartan (COZAAR) 100 MG tablet Take 100 mg by mouth daily.   Yes [provider]  Multiple Vitamins-Minerals (PRESERVISION AREDS 2 PO) Take 1 capsule by mouth daily.   Yes [provider]    Past Medical History: Past Medical History:  Diagnosis Date  . Coronary artery disease   . Hypertension     Past Surgical History: Past Surgical History:  Procedure Laterality Date  . RIGHT/LEFT HEART CATH AND CORONARY ANGIOGRAPHY N/A 03/16/2019   Procedure: RIGHT/LEFT HEART CATH AND CORONARY ANGIOGRAPHY;  Surgeon: Lyn Records, MD;  Location: [facility] INVASIVE [facility] LAB;  Service: Cardiovascular;  Laterality: N/A;    Family History: Family History  Problem Relation Age of Onset  . Hypertension Mother   . Diabetes Mother   . Diabetes Brother   . Alcoholism Father   . Diabetes type I Grandson      Social History: Social History   Socioeconomic History  . Marital status: Widowed    Spouse name: Not on file  . Number of children: Not on file  . Years of education: Not on file  . Highest education level: Not on file  Occupational History  . Not on file  Social Needs  . Financial resource strain: Not hard at all  . Food insecurity    Worry: Never true    Inability: Never true  . Transportation needs    Medical: No    Non-medical: No  Tobacco Use  . Smoking status: Current Some Day Smoker  . Smokeless tobacco: Never Used  Substance and Sexual Activity  . Alcohol use: No  . Drug use: Never  . Sexual activity: Not Currently  Lifestyle  . Physical activity    Days per week: 0 days    Minutes per session: Not on file  . Stress: Not at all  Relationships  . Social connections    Talks on phone: More than three times  a week    Gets together: Once a week    Attends religious service: More than 4 times per year    Active member of club or organization: No    Attends meetings of clubs or organizations: Never    Relationship status: Widowed  Other Topics Concern  . Not on file  Social History Narrative  . Not on file    Allergies:  Allergies  Allergen Reactions  . Aspirin Nausea Only and Other (See Comments)    GI Upset, stomach cramps     Objective:    Vital Signs:   Temp:  [97.6 F (36.4 C)-98.2 F (36.8 C)] 98.2 F (36.8 C) (10/28 0748) Pulse Rate:  [0-126] 86 (10/28 0748) Resp:  [0-49] 24 (10/28 0748) BP: (111-144)/(77-112) 121/77 (10/28 0748) SpO2:  [93 %-100 %] 94 % (10/28 0748) Last BM Date: 03/15/19  Weight change: Filed Weights   03/14/19 1715  Weight: 90.7 kg    Intake/Output:   Intake/Output Summary (Last 24 hours) at 03/17/2019 1007 Last data filed at 03/16/2019 1750 Gross per 24 hour  Intake 237.03 ml  Output -  Net 237.03 ml      Physical Exam    General:  Dyspneic when talking. No resp difficulty HEENT: normal except poor  dentition Neck: supple. JVP 11-12 . Carotids 2+ bilat; no bruits. No lymphadenopathy or thyromegaly appreciated. Cor: PMI nondisplaced. Regular rate & rhythm. No rubs, or murmurs. + S3  Lungs: clear Abdomen: soft, nontender, nondistended. No hepatosplenomegaly. No bruits or masses. Good bowel sounds. Extremities: no cyanosis, clubbing, rash, edema Neuro: alert & orientedx3, cranial nerves grossly intact. moves all 4 extremities w/o difficulty. Affect pleasant   Telemetry  Sinus Tach 100s   EKG    ST 126 bpm LBBB 146 Ann   Labs   Basic Metabolic Panel: Recent Labs  Lab 03/14/19 1600 03/16/19 0251 03/16/19 1519 03/16/19 1525 03/17/19 0323  NA 142 142 143 139 141  K 3.7 3.5 3.3* 3.3* 3.3*  CL 109 107  --   --  106  CO2 23 25  --   --  25  GLUCOSE 124* 105*  --   --  97  BUN 16 15  --   --  14  CREATININE 0.86 0.85  --   --  0.77  CALCIUM 9.3 8.9  --   --  9.0    Liver Function Tests: Recent Labs  Lab 03/14/19 1600  AST 24  ALT 38  ALKPHOS 59  BILITOT 0.8  PROT 7.3  ALBUMIN 4.1   No results for input(s): LIPASE, AMYLASE in the last 168 hours. No results for input(s): AMMONIA in the last 168 hours.  CBC: Recent Labs  Lab 03/14/19 1600 03/16/19 0251 03/16/19 1519 03/16/19 1525 03/17/19 0323  WBC 7.2 6.5  --   --  6.3  NEUTROABS 3.7  --   --   --   --   HGB 15.9* 14.4 14.3 13.9 14.3  HCT 51.0* 43.4 42.0 41.0 44.0  MCV 101.4* 98.2  --   --  99.3  PLT 207 161  --   --  150    Cardiac Enzymes: No results for input(s): CKTOTAL, CKMB, CKMBINDEX, TROPONINI in the last 168 hours.  BNP: BNP (last 3 results) Recent Labs    03/14/19 1600  BNP 1,035.7*    ProBNP (last 3 results) No results for input(s): PROBNP in the last 8760 hours.   CBG: No results for input(s): GLUCAP in the  last 168 hours.  Coagulation Studies: No results for input(s): LABPROT, INR in the last 72 hours.   Imaging    No results found.   Medications:     Current  Medications: . atorvastatin  40 mg Oral q1800  . carvedilol  3.125 mg Oral BID WC  . famotidine  10 mg Oral Daily  . heparin  5,000 Units Subcutaneous Q8H  . sacubitril-valsartan  1 tablet Oral BID  . sodium chloride flush  3 mL Intravenous Q12H  . sodium chloride flush  3 mL Intravenous Q12H  . sodium chloride flush  3 mL Intravenous Q12H     Infusions: . sodium chloride Stopped (03/15/19 0226)  . sodium chloride    . sodium chloride    . sodium chloride          Assessment/Plan   1. Acute Systolic Heart Failure ECHO Ef <20% and RV mildly reduced. NICM- needs CMRI once diuresed.   Cath this admit - LHC ok. RHC CO 4.7/CI 2.3 RA 5 and PCWP 23. - Tachycardic today. Check TSH. Add digoxin 0.125 mg daily -  Volume status elevated. Restart 40 mg IV lasix.   - Adding entresto 24-26 mg twice a day + carveidlol.   - Renal function ok.   2. Demand Ischemia  HS Trop trend elevated. Had LHC with widely patent LAD with each of 2 diagonals having 50% ostial narrowing.  Started on lipitor  3. HTN  Controlled.   4. LBBB Optimize HF meds. If EF remains low with need CRT-D   5. Tobacco Abuse Discussed smoking cessation.   6. Day Time Fatigue Will need outpatient sleep study.   With ongoing dyspnea. Needs PFTs given long history of smoking. Need to be followed closely in the HF clinic.   Length of Stay: 3  Amy Clegg, NP  03/17/2019, 10:07 AM  Advanced Heart Failure Team Pager [phone] (M-F; 7a - 4p)  Please contact [facility] for night-coverage after hours (4p -7a ) and weekends on [email or web address]  Patient seen with NP, agree with the above note .  She is doing better today, breathing has improved with diuresis.  No significant coronary disease.  She has history of HTN but does not appear to be poorly controlled.  She has a LBBB with QRS around 150, was not present in 2016.  No prior viral-type infection that she remembers.  Brother had an ICD placed. HR in 90s currently on  telemetry.   General: NAD Neck: JVP 8-9 cm, no thyromegaly or thyroid nodule.  Lungs: Clear to auscultation bilaterally with normal respiratory effort. CV: Lateral PMI.  Heart regular S1/S2, no S3/S4, no murmur.  No peripheral edema.  No carotid bruit.  Normal pedal pulses.  Abdomen: Soft, nontender, no hepatosplenomegaly, no distention.  Skin: Intact without lesions or rashes.  Neurologic: Alert and oriented x 3.  Psych: Normal affect. Extremities: No clubbing or cyanosis.  HEENT: Normal.   Nonischemic cardiomyopathy, admitted with CHF exacerbation.  Echo reviewed, dilated LV with EF < 20%, moderate MR, RV relatively preserved.  Cardiac output preserved on RHC.  Symptoms only for a couple of weeks prior to admission.  No prior viral-type syndrome.  She does have a LBBB that is new compared to 2016, around 150 msec.  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  - Continue Entresto 24/26 bid and Coreg 3.125 mg bid.  - Adding digoxin 0.125 with resting tachycardia even though cardiac output was not markedly low on  RHC.  - Still short of breath with exertion and mildly tachypneic, agree with Lasix 40 mg IV bid today then po most likely tomorrow.  - Will add spironolactone if BP/creatinine stable tomorrow.  - Cardiac MRI to look for infiltrative disease/myocarditis.  - If LV function does not improve with medical management, will likely be a CRT candidate with wide LBBB.  Will follow in CHF clinic.   Marca AnconaDalton Aubert Choyce 03/17/2019 2:49 PM

## 2019-03-17 NOTE — Progress Notes (Signed)
PROGRESS NOTE    Ann Bernard  PIR:[id number] DOB: 02-23-1946 DOA: 03/14/2019 PCP: Brayton El, PA-C    Brief Narrative:  73 year old female with past history of hypertension and hyperlipidemia who presented to the emergency room with 3 to 4 weeks of gradually worsening chest heaviness and epigastric discomfort associated with nausea, diaphoresis and shortness of breath on exertion.  Work-up in the emergency room showed chronic left bundle branch block and elevated troponin.  Patient was admitted with cardiology consult.  She had elevated high-sensitivity troponins.   Assessment & Plan:   Principal Problem:   Acute CHF (congestive heart failure) (HCC) Active Problems:   Essential hypertension   Acute coronary syndrome (HCC)   Precordial chest pain   Acute systolic HF (heart failure) (HCC)  Acute nonischemic cardiomyopathy with acute systolic congestive heart failure: Cause unknown.  Initially treated with heparin infusion.  Followed by cardiology.  Underwent cardiac cath with no obstructive lesion. Work-up under progress, followed by heart failure team, started on digoxin, Entresto, carvedilol and IV diuresis.  Replacing potassium.  Cardiac MRI ordered.  Further management as per heart failure team.  Hypertension: As above.  Blood pressures are fairly stable.  Hyperlipidemia: On Zocor.  GERD: On Pepcid.   DVT prophylaxis: Heparin subcu Code Status: Full code Family Communication: None Disposition Plan: Pending clinical improvement.  Home anticipated.   Consultants:   Cardiology  Heart failure cardiology  Procedures:   Cardiac cath, 03/16/2019  Antimicrobials:   None   Subjective: Patient seen and examined.  No overnight events.  Came back from cardiac cath.  Denies any chest pain.  Still short of breath on ambulation.  Objective: Vitals:   03/16/19 2309 03/17/19 0333 03/17/19 0748 03/17/19 1207  BP: 111/85 125/81 121/77 112/82  Pulse:  87 86 97   Resp:  15 (!) 24 14  Temp: 97.6 F (36.4 C) 97.7 F (36.5 C) 98.2 F (36.8 C)   TempSrc: Oral Oral Oral   SpO2:  97% 94% 97%  Weight:      Height:        Intake/Output Summary (Last 24 hours) at 03/17/2019 1445 Last data filed at 03/16/2019 1750 Gross per 24 hour  Intake 220 ml  Output -  Net 220 ml   Filed Weights   03/14/19 1715  Weight: 90.7 kg    Examination:  General exam: Appears calm and comfortable  Respiratory system: Clear to auscultation. Respiratory effort normal. Cardiovascular system: S1 & S2 heard, RRR. No JVD, murmurs, rubs, gallops or clicks. No pedal edema. Gastrointestinal system: Abdomen is nondistended, soft and nontender. No organomegaly or masses felt. Normal bowel sounds heard. Central nervous system: Alert and oriented. No focal neurological deficits. Extremities: Symmetric 5 x 5 power. Skin: No rashes, lesions or ulcers Psychiatry: Judgement and insight appear normal. Mood & affect appropriate.     Data Reviewed: I have personally reviewed following labs and imaging studies  CBC: Recent Labs  Lab 03/14/19 1600 03/16/19 0251 03/16/19 1519 03/16/19 1525 03/17/19 0323  WBC 7.2 6.5  --   --  6.3  NEUTROABS 3.7  --   --   --   --   HGB 15.9* 14.4 14.3 13.9 14.3  HCT 51.0* 43.4 42.0 41.0 44.0  MCV 101.4* 98.2  --   --  99.3  PLT 207 161  --   --  150   Basic Metabolic Panel: Recent Labs  Lab 03/14/19 1600 03/16/19 0251 03/16/19 1519 03/16/19 1525 03/17/19 0323  NA 142  142 143 139 141  K 3.7 3.5 3.3* 3.3* 3.3*  CL 109 107  --   --  106  CO2 23 25  --   --  25  GLUCOSE 124* 105*  --   --  97  BUN 16 15  --   --  14  CREATININE 0.86 0.85  --   --  0.77  CALCIUM 9.3 8.9  --   --  9.0   GFR: Estimated Creatinine Clearance: 72.4 mL/min (by C-G formula based on SCr of 0.77 mg/dL). Liver Function Tests: Recent Labs  Lab 03/14/19 1600  AST 24  ALT 38  ALKPHOS 59  BILITOT 0.8  PROT 7.3  ALBUMIN 4.1   No results for  input(s): LIPASE, AMYLASE in the last 168 hours. No results for input(s): AMMONIA in the last 168 hours. Coagulation Profile: No results for input(s): INR, PROTIME in the last 168 hours. Cardiac Enzymes: No results for input(s): CKTOTAL, CKMB, CKMBINDEX, TROPONINI in the last 168 hours. BNP (last 3 results) No results for input(s): PROBNP in the last 8760 hours. HbA1C: Recent Labs    03/16/19 0251  HGBA1C 5.5   CBG: No results for input(s): GLUCAP in the last 168 hours. Lipid Profile: Recent Labs    03/16/19 0251  CHOL 194  HDL 53  LDLCALC 128*  TRIG 65  CHOLHDL 3.7   Thyroid Function Tests: Recent Labs    03/17/19 1323  TSH 2.833   Anemia Panel: No results for input(s): VITAMINB12, FOLATE, FERRITIN, TIBC, IRON, RETICCTPCT in the last 72 hours. Sepsis Labs: No results for input(s): PROCALCITON, LATICACIDVEN in the last 168 hours.  Recent Results (from the past 240 hour(s))  SARS Coronavirus 2 by RT PCR (hospital order, performed in [facility] hospital lab) Nasopharyngeal Nasopharyngeal Swab     Status: None   Collection Time: 03/14/19  5:19 PM   Specimen: Nasopharyngeal Swab  Result Value Ref Range Status   SARS Coronavirus 2 NEGATIVE NEGATIVE Final    Comment: (NOTE) If result is NEGATIVE SARS-CoV-2 target nucleic acids are NOT DETECTED. The SARS-CoV-2 RNA is generally detectable in upper and lower  respiratory specimens during the acute phase of infection. The lowest  concentration of SARS-CoV-2 viral copies this assay can detect is 250  copies / mL. A negative result does not preclude SARS-CoV-2 infection  and should not be used as the sole basis for treatment or other  patient management decisions.  A negative result may occur with  improper specimen collection / handling, submission of specimen other  than nasopharyngeal swab, presence of viral mutation(s) within the  areas targeted by this assay, and inadequate number of viral copies  (<250 copies / mL).  A negative result must be combined with clinical  observations, patient history, and epidemiological information. If result is POSITIVE SARS-CoV-2 target nucleic acids are DETECTED. The SARS-CoV-2 RNA is generally detectable in upper and lower  respiratory specimens dur ing the acute phase of infection.  Positive  results are indicative of active infection with SARS-CoV-2.  Clinical  correlation with patient history and other diagnostic information is  necessary to determine patient infection status.  Positive results do  not rule out bacterial infection or co-infection with other viruses. If result is PRESUMPTIVE POSTIVE SARS-CoV-2 nucleic acids MAY BE PRESENT.   A presumptive positive result was obtained on the submitted specimen  and confirmed on repeat testing.  While 2019 novel coronavirus  (SARS-CoV-2) nucleic acids may be present in the submitted sample  additional confirmatory testing may be necessary for epidemiological  and / or clinical management purposes  to differentiate between  SARS-CoV-2 and other Sarbecovirus currently known to infect humans.  If clinically indicated additional testing with an alternate test  methodology [id number]) is advised. The SARS-CoV-2 RNA is generally  detectable in upper and lower respiratory sp ecimens during the acute  phase of infection. The expected result is Negative. Fact Sheet for Patients:  StrictlyIdeas.no Fact Sheet for Healthcare Providers: BankingDealers.co.za This test is not yet approved or cleared by the [place] FDA and has been authorized for detection and/or diagnosis of SARS-CoV-2 by FDA under an Emergency Use Authorization (EUA).  This EUA will remain in effect (meaning this test can be used) for the duration of the COVID-19 declaration under Section 564(b)(1) of the Act, 21 U.S.C. section 360bbb-3(b)(1), unless the authorization is terminated or revoked sooner.  Performed at [facility], [address] [place], [place] [address]   Blood Culture (routine x 2)     Status: None (Preliminary result)   Collection Time: 03/14/19  9:40 PM   Specimen: BLOOD  Result Value Ref Range Status   Specimen Description   Final    BLOOD LEFT ANTECUBITAL Performed at [facility], [address] [place], [place] [address]    Special Requests   Final    BOTTLES DRAWN AEROBIC AND ANAEROBIC Blood Culture adequate volume Performed at [facility], [address] [place], [place] [address]    Culture   Final    NO GROWTH 2 DAYS Performed at [facility], [place] [address] [place], [place] [address]    Report Status PENDING  Incomplete  Blood Culture (routine x 2)     Status: None (Preliminary result)   Collection Time: 03/14/19  9:40 PM   Specimen: BLOOD RIGHT WRIST  Result Value Ref Range Status   Specimen Description   Final    BLOOD RIGHT WRIST Performed at [facility], [address] [place], [place] [address]    Special Requests   Final    BOTTLES DRAWN AEROBIC AND ANAEROBIC Blood Culture adequate volume Performed at [facility], [address] [place], [place] [address]    Culture   Final    NO GROWTH 2 DAYS Performed at [facility], [place] [address], [place], [place] [address]    Report Status PENDING  Incomplete  MRSA PCR Screening     Status: None   Collection Time: 03/15/19 10:08 PM   Specimen: Nasopharyngeal  Result Value Ref Range Status   MRSA by PCR NEGATIVE NEGATIVE Final    Comment:        The GeneXpert MRSA Assay (FDA approved for NASAL specimens only), is one component of a comprehensive MRSA colonization surveillance program. It is not intended to diagnose MRSA infection nor to guide or monitor treatment for MRSA infections. Performed at [facility], [place] [address] [place], [place] [address]          Radiology Studies: No results found.       Scheduled Meds: . atorvastatin  40 mg Oral q1800  . carvedilol  3.125 mg Oral BID WC  . digoxin  0.125 mg Oral Daily  . famotidine  10 mg Oral Daily  . furosemide  40 mg Intravenous BID  . heparin  5,000 Units Subcutaneous Q8H  . sacubitril-valsartan  1 tablet Oral BID  .  sodium chloride flush  3 mL Intravenous Q12H  . sodium chloride flush  3 mL Intravenous Q12H  . sodium chloride flush  3 mL Intravenous Q12H   Continuous Infusions: . sodium chloride Stopped (03/15/19 0226)  . sodium chloride    . sodium chloride    . sodium chloride       LOS: 3 days    Time spent: 25 minutes    Dorcas CarrowKuber Eliabeth Shoff, MD [facility] Pager [phone]

## 2019-03-18 ENCOUNTER — Inpatient Hospital Stay (HOSPITAL_COMMUNITY): Payer: Medicare HMO

## 2019-03-18 DIAGNOSIS — I428 Other cardiomyopathies: Secondary | ICD-10-CM

## 2019-03-18 LAB — BASIC METABOLIC PANEL
Anion gap: 11 (ref 5–15)
BUN: 17 mg/dL (ref 8–23)
CO2: 24 mmol/L (ref 22–32)
Calcium: 8.5 mg/dL — ABNORMAL LOW (ref 8.9–10.3)
Chloride: 104 mmol/L (ref 98–111)
Creatinine, Ser: 0.84 mg/dL (ref 0.44–1.00)
GFR calc Af Amer: >60 mL/min (ref >60)
GFR calc non Af Amer: >60 mL/min (ref >60)
Glucose, Bld: 107 mg/dL — ABNORMAL HIGH (ref 70–99)
Potassium: 3.3 mmol/L — ABNORMAL LOW (ref 3.5–5.1)
Sodium: 139 mmol/L (ref 135–145)

## 2019-03-18 MED ORDER — POTASSIUM CHLORIDE CRYS ER 20 MEQ PO TBCR
20.0000 meq | EXTENDED_RELEASE_TABLET | Freq: Two times a day (BID) | ORAL | Status: DC
  Administered 2019-03-18: 20 meq via ORAL
  Filled 2019-03-18: qty 1

## 2019-03-18 MED ORDER — POTASSIUM CHLORIDE CRYS ER 10 MEQ PO TBCR: 10.0000 meq | EXTENDED_RELEASE_TABLET | Freq: Every day | ORAL | 0 refills | Status: DC

## 2019-03-18 MED ORDER — FUROSEMIDE 20 MG PO TABS: 20.0000 mg | ORAL_TABLET | Freq: Every day | ORAL | Status: DC

## 2019-03-18 MED ORDER — FUROSEMIDE 20 MG PO TABS: 20.0000 mg | ORAL_TABLET | Freq: Every day | ORAL | 0 refills | Status: DC

## 2019-03-18 MED ORDER — DIGOXIN 125 MCG PO TABS: 0.1250 mg | ORAL_TABLET | Freq: Every day | ORAL | 0 refills | Status: DC

## 2019-03-18 MED ORDER — SACUBITRIL-VALSARTAN 24-26 MG PO TABS: 1.0000 | ORAL_TABLET | Freq: Two times a day (BID) | ORAL | 3 refills | Status: DC

## 2019-03-18 MED ORDER — GADOBUTROL 1 MMOL/ML IV SOLN
9.0000 mL | Freq: Once | INTRAVENOUS | Status: AC | PRN
  Administered 2019-03-18: 9 mL via INTRAVENOUS

## 2019-03-18 MED ORDER — ATORVASTATIN CALCIUM 40 MG PO TABS: 40.0000 mg | ORAL_TABLET | Freq: Every day | ORAL | 0 refills | Status: DC

## 2019-03-18 MED ORDER — SPIRONOLACTONE 12.5 MG HALF TABLET
12.5000 mg | ORAL_TABLET | Freq: Every day | ORAL | Status: DC
  Administered 2019-03-18: 12.5 mg via ORAL
  Filled 2019-03-18: qty 1

## 2019-03-18 MED ORDER — CARVEDILOL 3.125 MG PO TABS: 3.1250 mg | ORAL_TABLET | Freq: Two times a day (BID) | ORAL | 0 refills | Status: DC

## 2019-03-18 MED FILL — ENTRESTO 24 MG-26 MG TABLET: 24-26 | 30 days supply | Qty: 60 | Fill #0

## 2019-03-18 MED FILL — FUROSEMIDE 20 MG TAB: 20 | 30 days supply | Qty: 30 | Fill #0

## 2019-03-18 MED FILL — DIGOXIN 0.125 MG TABLET: 125 | 30 days supply | Qty: 30 | Fill #0

## 2019-03-18 MED FILL — POTASSIUM CHL ER M10 TABLET: 10 | 30 days supply | Qty: 30 | Fill #0

## 2019-03-18 MED FILL — CARVEDILOL 3.125 MG TABLET: 3.125 | 30 days supply | Qty: 60 | Fill #0

## 2019-03-18 MED FILL — ATORVASTATIN CALCIUM 40 MG: 40 | 30 days supply | Qty: 30 | Fill #0

## 2019-03-18 NOTE — Progress Notes (Signed)
CARDIAC REHAB PHASE I   Pt states she is able to ambulate around the room without difficulty. Heart failure education completed with pt and son. Pt given HF Booklet, low sodium diets, and exercise guidelines. Stressed importance of daily weights, pt states they are stopping for a scale on their way home. Pt anxious about returning to normal activities, encouraged participation in CRP II. Pt agreeable and will send referral to CRP II [place].   [id number] Rufina Falco, RN BSN 03/18/2019 2:27 PM

## 2019-03-18 NOTE — Plan of Care (Signed)

## 2019-03-18 NOTE — Progress Notes (Signed)
Pt education completed with son and Pt, Pt to be discharged home with son. Pt given AVS, awaiting [facility] for Medication

## 2019-03-18 NOTE — Discharge Summary (Signed)
Physician Discharge Summary  Ancil LinseyBettye Merlos ZOX:[id number] DOB: [birth year] DOA: 03/14/2019  PCP: Brayton Elrosinis, Donna J, PA-C  Admit date: 03/14/2019 Discharge date: 03/18/2019  Admitted From: Home Disposition: Home  Recommendations for Outpatient Follow-up:  1. Follow up with PCP in 1-2 weeks 2. Follow-up with cardiology as scheduled.  Home Health: Not applicable Equipment/Devices: Not applicable  Discharge Condition: Stable CODE STATUS: Full code Diet recommendation: Cardiac diet, low-salt diet  Discharge summary: 73 year old female with history of hypertension hyperlipidemia presented to the emergency room with 3 to 4 weeks of gradually worsening chest heaviness and epigastric discomfort associated with nausea, diaphoresis and shortness of breath on exertion.  Work-up in the emergency room showed chronic left bundle branch block and elevated troponins.  Admit to the hospital with cardiology consult.  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  Cardiac cath with no obstructive lesion.  MRI with ejection fraction 13%, no other demonstrable cause for heart failure.  Followed by heart failure team.  Patient is currently improved after treatment with diuretics.  She is on room air and ambulating with no shortness of breath or chest pain. As per heart failure team recommendations, patient is going home on Lasix, potassium supplement, digoxin, carvedilol and Entresto.  She will have outpatient follow-up and further management/repeat echocardiogram with cardiology.  Discharge Diagnoses:  Principal Problem:   Acute CHF (congestive heart failure) (HCC) Active Problems:   Essential hypertension   Acute coronary syndrome (HCC)   Precordial chest pain   Acute systolic HF (heart failure) [facility](HCC)    Discharge Instructions  Discharge Instructions    Call MD for:  difficulty  breathing, headache or visual disturbances   Complete by: As directed    Diet - low sodium heart healthy   Complete by: As directed    Increase activity slowly   Complete by: As directed      Allergies as of 03/18/2019      Reactions   Aspirin Nausea Only, Other (See Comments)   GI Upset, stomach cramps      Medication List    STOP taking these medications   losartan 100 MG tablet Commonly known as: COZAAR     TAKE these medications   alendronate 70 MG tablet Commonly known as: FOSAMAX Take 70 mg by mouth once a week.   atorvastatin 40 MG tablet Commonly known as: LIPITOR Take 1 tablet (40 mg total) by mouth daily at 6 PM.   bisacodyl 5 MG EC tablet Commonly known as: DULCOLAX Take 5 mg by mouth daily as needed for moderate constipation.   carvedilol 3.125 MG tablet Commonly known as: COREG Take 1 tablet (3.125 mg total) by mouth 2 (two) times daily with a meal.   digoxin 0.125 MG tablet Commonly known as: LANOXIN Take 1 tablet (0.125 mg total) by mouth daily. Start taking on: March 19, 2019   diphenhydrAMINE 25 MG tablet Commonly known as: BENADRYL Take 25 mg by mouth every 6 (six) hours as needed for itching.   furosemide 20 MG tablet Commonly known as: LASIX Take 1 tablet (20 mg total) by mouth daily. Start taking on: March 19, 2019   potassium chloride 10 MEQ tablet Commonly known as: KLOR-CON Take 1 tablet (10 mEq total) by mouth daily.   PRESERVISION AREDS 2 PO Take 1 capsule by mouth daily.   sacubitril-valsartan 24-26 MG Commonly known as: ENTRESTO Take 1 tablet by mouth 2 (two) times daily.  Follow-up Information    [facility] Follow up on 04/22/2019.   Specialty: Cardiology Why: at 2:[id number]  Contact information: [address] [id number] [place] [address] [phone]         Allergies  Allergen Reactions  . Aspirin Nausea Only and Other (See  Comments)    GI Upset, stomach cramps     Consultations:  Cardiology   Procedures/Studies: Ct Angio Chest Pe W And/or Wo Contrast  Result Date: 03/14/2019 CLINICAL DATA:  Shortness of breath, chest pain EXAM: CT ANGIOGRAPHY CHEST WITH CONTRAST TECHNIQUE: Multidetector CT imaging of the chest was performed using the standard protocol during bolus administration of intravenous contrast. Multiplanar CT image reconstructions and MIPs were obtained to evaluate the vascular anatomy. CONTRAST:  81mL OMNIPAQUE IOHEXOL 350 MG/ML SOLN COMPARISON:  None. FINDINGS: Cardiovascular: Satisfactory opacification of the pulmonary arteries to the segmental level. No evidence of pulmonary embolism. Cardiomegaly. Scattered left coronary artery calcifications. Trace pericardial effusion. Scattered aortic atherosclerosis. Mediastinum/Nodes: No enlarged mediastinal, hilar, or axillary lymph nodes. Thyroid gland, trachea, and esophagus demonstrate no significant findings. Lungs/Pleura: Mild centrilobular emphysema. Heterogeneous and ground-glass opacities, most conspicuous in the dependent right lower lobe. Diffuse bilateral bronchial wall thickening. Small, right greater than left pleural effusions. Upper Abdomen: No acute abnormality. Musculoskeletal: No chest wall abnormality. No acute or significant osseous findings. Review of the MIP images confirms the above findings. IMPRESSION: 1.  Negative examination for pulmonary embolism. 2. Heterogeneous and ground-glass opacities, most conspicuous in the dependent right lower lobe. Findings are most consistent with infection, however edema could have this asymmetric appearance. 3.  Small bilateral pleural effusions. 4.  Diffuse bilateral bronchial wall thickening, nonspecific. 5.  Cardiomegaly and coronary artery disease. 6.  Trace, nonspecific pericardial effusion. 7.  Aortic Atherosclerosis (ICD10-I70.0). Electronically Signed   By: Lauralyn Primes M.D.   On: 03/14/2019 20:59    Dg Chest Portable 1 View  Result Date: 03/14/2019 CLINICAL DATA:  Chest pain, shortness of breath EXAM: PORTABLE CHEST 1 VIEW COMPARISON:  05/07/2015 FINDINGS: Bilateral diffuse interstitial thickening. No pleural effusion or pneumothorax. Stable cardiomegaly. No acute osseous abnormality. IMPRESSION: Bilateral interstitial thickening and cardiomegaly most concerning for mild CHF versus interstitial infection. Electronically Signed   By: Elige Ko   On: 03/14/2019 16:45   Mr Cardiac Morphology W Wo Contrast  Result Date: 03/18/2019 CLINICAL DATA:  Nonischemic cardiomyopathy. EXAM: CARDIAC MRI TECHNIQUE: The patient was scanned on a 1.5 Tesla GE magnet. A dedicated cardiac coil was used. Functional imaging was done using Fiesta sequences. 2,3, and 4 chamber views were done to assess for RWMA's. Modified Simpson's rule using a short axis stack was used to calculate an ejection fraction on a dedicated work [employer]. The patient received 7 cc of Gadavist. After 10 minutes inversion recovery sequences were used to assess for infiltration and scar tissue. FINDINGS: Limited images of the lung fields showed no gross abnormalities. Small circumferential pericardial effusion. Moderately dilated left ventricle with normal wall thickness. Diffuse severe hypokinesis with septal-lateral dyssynchrony consistent with LBBB, EF 13%. No LV thrombus noted. Normal right ventricular size with moderate systolic dysfunction, EF 30%. Mild left atrial enlargement. Normal right atrial size. Mild-moderate mitral regurgitation. The aortic valve is trileaflet. No significant aortic stenosis. Visually, there appears to be moderate aortic regurgitation. Delayed enhancement imaging showed subepicardial late gadolinium enhancement (LGE) in the basal to mid inferoseptal wall at the inferior RV insertion site. Measurements: LVEDV 317 mL  LVSV 42 mL LVEF 13% RVEDV 77 mL RVSV 23 mL RVEF 30% IMPRESSION: 1.  Small  pericardial effusion. 2. Moderately dilated LV with EF 13%, diffuse hypokinesis with septal-lateral dyssynchrony. 3. Normal RV size with moderately decreased systolic function, EF 30%. 4.  Mild to moderate MR, moderate AI. 5. Nonspecific RV insertion site LGE, this can be seen in the setting of LV pressure overload. Dalton Mclean Electronically Signed   By: Marca Anconaalton  Mclean M.D.   On: 03/18/2019 13:13     Subjective: Patient seen and examined.  No overnight events.  She walked around in the hallway, no shortness of breath or chest pain.   Discharge Exam: Vitals:   03/18/19 1100 03/18/19 1215  BP: 95/61 94/70  Pulse: 76 77  Resp: 17 19  Temp: 97.9 F (36.6 C) 98 F (36.7 C)  SpO2: 97% 99%   Vitals:   03/18/19 0741 03/18/19 0932 03/18/19 1100 03/18/19 1215  BP: 94/68  95/61 94/70  Pulse: 62 78 76 77  Resp: 18  17 19   Temp: 97.6 F (36.4 C)  97.9 F (36.6 C) 98 F (36.7 C)  TempSrc: Oral  Oral Oral  SpO2: 98%  97% 99%  Weight:      Height:        General: Pt is alert, awake, not in acute distress, on room air. Cardiovascular: RRR, S1/S2 +, no rubs, no gallops Respiratory: CTA bilaterally, no wheezing, no rhonchi Abdominal: Soft, NT, ND, bowel sounds + Extremities: no edema, no cyanosis    The results of significant diagnostics from this hospitalization (including imaging, microbiology, ancillary and laboratory) are listed below for reference.     Microbiology: Recent Results (from the past 240 hour(s))  SARS Coronavirus 2 by RT PCR (hospital order, performed in [facility] hospital lab) Nasopharyngeal Nasopharyngeal Swab     Status: None   Collection Time: 03/14/19  5:19 PM   Specimen: Nasopharyngeal Swab  Result Value Ref Range Status   SARS Coronavirus 2 NEGATIVE NEGATIVE Final    Comment: (NOTE) If result is NEGATIVE SARS-CoV-2 target nucleic acids are NOT DETECTED. The SARS-CoV-2 RNA is generally detectable in upper and lower  respiratory specimens during the  acute phase of infection. The lowest  concentration of SARS-CoV-2 viral copies this assay can detect is 250  copies / mL. A negative result does not preclude SARS-CoV-2 infection  and should not be used as the sole basis for treatment or other  patient management decisions.  A negative result may occur with  improper specimen collection / handling, submission of specimen other  than nasopharyngeal swab, presence of viral mutation(s) within the  areas targeted by this assay, and inadequate number of viral copies  (<250 copies / mL). A negative result must be combined with clinical  observations, patient history, and epidemiological information. If result is POSITIVE SARS-CoV-2 target nucleic acids are DETECTED. The SARS-CoV-2 RNA is generally detectable in upper and lower  respiratory specimens dur ing the acute phase of infection.  Positive  results are indicative of active infection with SARS-CoV-2.  Clinical  correlation with patient history and other diagnostic information is  necessary to determine patient infection status.  Positive results do  not rule out bacterial infection or co-infection with other viruses. If result is PRESUMPTIVE POSTIVE SARS-CoV-2 nucleic acids MAY BE PRESENT.   A presumptive positive result was obtained on the submitted specimen  and confirmed on repeat testing.  While 2019 novel coronavirus  (SARS-CoV-2) nucleic acids may be present  in the submitted sample  additional confirmatory testing may be necessary for epidemiological  and / or clinical management purposes  to differentiate between  SARS-CoV-2 and other Sarbecovirus currently known to infect humans.  If clinically indicated additional testing with an alternate test  methodology [phone]) is advised. The SARS-CoV-2 RNA is generally  detectable in upper and lower respiratory sp ecimens during the acute  phase of infection. The expected result is Negative. Fact Sheet for Patients:   StrictlyIdeas.no Fact Sheet for Healthcare Providers: BankingDealers.co.za This test is not yet approved or cleared by the [place] FDA and has been authorized for detection and/or diagnosis of SARS-CoV-2 by FDA under an Emergency Use Authorization (EUA).  This EUA will remain in effect (meaning this test can be used) for the duration of the COVID-19 declaration under Section 564(b)(1) of the Act, 21 U.S.C. section 360bbb-3(b)(1), unless the authorization is terminated or revoked sooner. Performed at [facility], [address] [place], [place] [address]   Blood Culture (routine x 2)     Status: None (Preliminary result)   Collection Time: 03/14/19  9:40 PM   Specimen: BLOOD  Result Value Ref Range Status   Specimen Description   Final    BLOOD LEFT ANTECUBITAL Performed at [facility], [address] [place], [place] [address]    Special Requests   Final    BOTTLES DRAWN AEROBIC AND ANAEROBIC Blood Culture adequate volume Performed at [facility], [address] [place], [place] [address]    Culture   Final    NO GROWTH 3 DAYS Performed at [facility], [place] [address] [place], [place] [address]    Report Status PENDING  Incomplete  Blood Culture (routine x 2)     Status: None (Preliminary result)   Collection Time: 03/14/19  9:40 PM   Specimen: BLOOD RIGHT WRIST  Result Value Ref Range Status   Specimen Description   Final    BLOOD RIGHT WRIST Performed at [facility], [address] [place], [place] [address]    Special Requests   Final    BOTTLES DRAWN AEROBIC AND ANAEROBIC Blood Culture adequate volume Performed at [facility], [address] [place], [place] [address]    Culture   Final    NO GROWTH 3 DAYS Performed at [facility], [place] [address], [place], [place] [address]    Report Status PENDING  Incomplete  MRSA PCR Screening      Status: None   Collection Time: 03/15/19 10:08 PM   Specimen: Nasopharyngeal  Result Value Ref Range Status   MRSA by PCR NEGATIVE NEGATIVE Final    Comment:        The GeneXpert MRSA Assay (FDA approved for NASAL specimens only), is one component of a comprehensive MRSA colonization surveillance program. It is not intended to diagnose MRSA infection nor to guide or monitor treatment for MRSA infections. Performed at [facility], [place] [address], [place], [place] [address]      Labs: BNP (last 3 results) Recent Labs    03/14/19 1600  BNP 5,188.4*   Basic Metabolic Panel: Recent Labs  Lab 03/14/19 1600 03/16/19 0251 03/16/19 1519 03/16/19 1525 03/17/19 0323 03/18/19 0228  NA 142 142 143 139 141 139  K 3.7 3.5 3.3* 3.3* 3.3* 3.3*  CL 109 107  --   --  106 104  CO2 23 25  --   --  25 24  GLUCOSE 124* 105*  --   --  97 107*  BUN 16 15  --   --  14 17  CREATININE 0.86 0.85  --   --  0.77 0.84  CALCIUM 9.3 8.9  --   --  9.0 8.5*   Liver Function Tests: Recent Labs  Lab 03/14/19 1600  AST 24  ALT 38  ALKPHOS 59  BILITOT 0.8  PROT 7.3  ALBUMIN 4.1   No results for input(s): LIPASE, AMYLASE in the last 168 hours. No results for input(s): AMMONIA in the last 168 hours. CBC: Recent Labs  Lab 03/14/19 1600 03/16/19 0251 03/16/19 1519 03/16/19 1525 03/17/19 0323  WBC 7.2 6.5  --   --  6.3  NEUTROABS 3.7  --   --   --   --   HGB 15.9* 14.4 14.3 13.9 14.3  HCT 51.0* 43.4 42.0 41.0 44.0  MCV 101.4* 98.2  --   --  99.3  PLT 207 161  --   --  150   Cardiac Enzymes: No results for input(s): CKTOTAL, CKMB, CKMBINDEX, TROPONINI in the last 168 hours. BNP: Invalid input(s): POCBNP CBG: No results for input(s): GLUCAP in the last 168 hours. D-Dimer No results for input(s): DDIMER in the last 72 hours. Hgb A1c Recent Labs    03/16/19 0251  HGBA1C 5.5   Lipid Profile Recent Labs    03/16/19 0251  CHOL 194  HDL 53  LDLCALC 128*  TRIG 65   CHOLHDL 3.7   Thyroid function studies Recent Labs    03/17/19 1323  TSH 2.833   Anemia work up No results for input(s): VITAMINB12, FOLATE, FERRITIN, TIBC, IRON, RETICCTPCT in the last 72 hours. Urinalysis No results found for: COLORURINE, APPEARANCEUR, LABSPEC, PHURINE, GLUCOSEU, HGBUR, BILIRUBINUR, KETONESUR, PROTEINUR, UROBILINOGEN, NITRITE, LEUKOCYTESUR Sepsis Labs Invalid input(s): PROCALCITONIN,  WBC,  LACTICIDVEN Microbiology Recent Results (from the past 240 hour(s))  SARS Coronavirus 2 by RT PCR (hospital order, performed in [facility] hospital lab) Nasopharyngeal Nasopharyngeal Swab     Status: None   Collection Time: 03/14/19  5:19 PM   Specimen: Nasopharyngeal Swab  Result Value Ref Range Status   SARS Coronavirus 2 NEGATIVE NEGATIVE Final    Comment: (NOTE) If result is NEGATIVE SARS-CoV-2 target nucleic acids are NOT DETECTED. The SARS-CoV-2 RNA is generally detectable in upper and lower  respiratory specimens during the acute phase of infection. The lowest  concentration of SARS-CoV-2 viral copies this assay can detect is 250  copies / mL. A negative result does not preclude SARS-CoV-2 infection  and should not be used as the sole basis for treatment or other  patient management decisions.  A negative result may occur with  improper specimen collection / handling, submission of specimen other  than nasopharyngeal swab, presence of viral mutation(s) within the  areas targeted by this assay, and inadequate number of viral copies  (<250 copies / mL). A negative result must be combined with clinical  observations, patient history, and epidemiological information. If result is POSITIVE SARS-CoV-2 target nucleic acids are DETECTED. The SARS-CoV-2 RNA is generally detectable in upper and lower  respiratory specimens dur ing the acute phase of infection.  Positive  results are indicative of active infection with SARS-CoV-2.  Clinical  correlation with patient  history and other diagnostic information is  necessary to determine patient infection status.  Positive results do  not rule out bacterial infection or co-infection with other viruses. If result is PRESUMPTIVE POSTIVE  SARS-CoV-2 nucleic acids MAY BE PRESENT.   A presumptive positive result was obtained on the submitted specimen  and confirmed on repeat testing.  While 2019 novel coronavirus  (SARS-CoV-2) nucleic acids may be present in the submitted sample  additional confirmatory testing may be necessary for epidemiological  and / or clinical management purposes  to differentiate between  SARS-CoV-2 and other Sarbecovirus currently known to infect humans.  If clinically indicated additional testing with an alternate test  methodology [phone]) is advised. The SARS-CoV-2 RNA is generally  detectable in upper and lower respiratory sp ecimens during the acute  phase of infection. The expected result is Negative. Fact Sheet for Patients:  BoilerBrush.com.cy Fact Sheet for Healthcare Providers: https://pope.com/ This test is not yet approved or cleared by the [place] FDA and has been authorized for detection and/or diagnosis of SARS-CoV-2 by FDA under an Emergency Use Authorization (EUA).  This EUA will remain in effect (meaning this test can be used) for the duration of the COVID-19 declaration under Section 564(b)(1) of the Act, 21 U.S.C. section 360bbb-3(b)(1), unless the authorization is terminated or revoked sooner. Performed at [facility], [address] [place], [place] [address]   Blood Culture (routine x 2)     Status: None (Preliminary result)   Collection Time: 03/14/19  9:40 PM   Specimen: BLOOD  Result Value Ref Range Status   Specimen Description   Final    BLOOD LEFT ANTECUBITAL Performed at [facility], [address] [place], [place] [address]    Special Requests   Final    BOTTLES  DRAWN AEROBIC AND ANAEROBIC Blood Culture adequate volume Performed at [facility], [address] [place], [place] [address]    Culture   Final    NO GROWTH 3 DAYS Performed at [facility], [address] [place], [place] [address]    Report Status PENDING  Incomplete  Blood Culture (routine x 2)     Status: None (Preliminary result)   Collection Time: 03/14/19  9:40 PM   Specimen: BLOOD RIGHT WRIST  Result Value Ref Range Status   Specimen Description   Final    BLOOD RIGHT WRIST Performed at [facility], [address] [place], [place] [address]    Special Requests   Final    BOTTLES DRAWN AEROBIC AND ANAEROBIC Blood Culture adequate volume Performed at [facility], [address] [place], [place] [address]    Culture   Final    NO GROWTH 3 DAYS Performed at [facility], [address] [place], [place] [address]    Report Status PENDING  Incomplete  MRSA PCR Screening     Status: None   Collection Time: 03/15/19 10:08 PM   Specimen: Nasopharyngeal  Result Value Ref Range Status   MRSA by PCR NEGATIVE NEGATIVE Final    Comment:        The GeneXpert MRSA Assay (FDA approved for NASAL specimens only), is one component of a comprehensive MRSA colonization surveillance program. It is not intended to diagnose MRSA infection nor to guide or monitor treatment for MRSA infections. Performed at [facility], [address] [place], [place] [address]      Time coordinating discharge: 35 minutes  SIGNED:   Dorcas Carrow, MD  [facility] 03/18/2019, 2:19 PM

## 2019-03-18 NOTE — Discharge Instructions (Signed)
Angiogram, Care After °This sheet gives you information about how to care for yourself after your procedure. Your doctor may also give you more specific instructions. If you have problems or questions, contact your doctor. °Follow these instructions at home: °Insertion site care °· Follow instructions from your doctor about how to take care of your long, thin tube (catheter) insertion area. Make sure you: °? Wash your hands with soap and water before you change your bandage (dressing). If you cannot use soap and water, use hand sanitizer. °? Change your bandage as told by your doctor. °? Leave stitches (sutures), skin glue, or skin tape (adhesive) strips in place. They may need to stay in place for 2 weeks or longer. If tape strips get loose and curl up, you may trim the loose edges. Do not remove tape strips completely unless your doctor says it is okay. °· Do not take baths, swim, or use a hot tub until your doctor says it is okay. °· You may shower 24-48 hours after the procedure or as told by your doctor. °? Gently wash the area with plain soap and water. °? Pat the area dry with a clean towel. °? Do not rub the area. This may cause bleeding. °· Do not apply powder or lotion to the area. Keep the area clean and dry. °· Check your insertion area every day for signs of infection. Check for: °? More redness, swelling, or pain. °? Fluid or blood. °? Warmth. °? Pus or a bad smell. °Activity °· Rest as told by your doctor, usually for 1-2 days. °· Do not lift anything that is heavier than 10 lbs. (4.5 kg) or as told by your doctor. °· Do not drive for 24 hours if you were given a medicine to help you relax (sedative). °· Do not drive or use heavy machinery while taking prescription pain medicine. °General instructions ° °· Go back to your normal activities as told by your doctor, usually in about a week. Ask your doctor what activities are safe for you. °· If the insertion area starts to bleed, lie flat and put  pressure on the area. If the bleeding does not stop, get help right away. This is an emergency. °· Drink enough fluid to keep your pee (urine) clear or pale yellow. °· Take over-the-counter and prescription medicines only as told by your doctor. °· Keep all follow-up visits as told by your doctor. This is important. °Contact a doctor if: °· You have a fever. °· You have chills. °· You have more redness, swelling, or pain around your insertion area. °· You have fluid or blood coming from your insertion area. °· The insertion area feels warm to the touch. °· You have pus or a bad smell coming from your insertion area. °· You have more bruising around the insertion area. °· Blood collects in the tissue around the insertion area (hematoma) that may be painful to the touch. °Get help right away if: °· You have a lot of pain in the insertion area. °· The insertion area swells very fast. °· The insertion area is bleeding, and the bleeding does not stop after holding steady pressure on the area. °· The area near or just beyond the insertion area becomes pale, cool, tingly, or numb. °These symptoms may be an emergency. Do not wait to see if the symptoms will go away. Get medical help right away. Call your local emergency services (911 in the [place] Do not drive yourself to the hospital. °  Summary  After the procedure, it is common to have bruising and tenderness at the long, thin tube insertion area.  After the procedure, it is important to rest and drink plenty of fluids.  Do not take baths, swim, or use a hot tub until your doctor says it is okay to do so. You may shower 24-48 hours after the procedure or as told by your doctor.  If the insertion area starts to bleed, lie flat and put pressure on the area. If the bleeding does not stop, get help right away. This is an emergency. This information is not intended to replace advice given to you by your health care provider. Make sure you discuss any questions you have  with your health care provider. Document Released: 08/02/2008 Document Revised: 04/18/2017 Document Reviewed: 04/30/2016 Elsevier Patient Education  2020 [place].     Heart Failure, Diagnosis  Heart failure is a condition in which the heart has trouble pumping blood because it has become weak or stiff. This means that the heart does not pump blood well enough for the body to stay healthy. For some people with heart failure, fluid may back up into the lungs. There may also be swelling (edema) in the lower legs. Heart failure is usually a long-term (chronic) condition. It is important for you to take good care of yourself and follow the treatment plan from your health care provider. What are the causes? This condition may be caused by:  High blood pressure (hypertension). Hypertension causes the heart muscle to work harder than normal. This makes the heart stiff or weak.  Coronary artery disease, or CAD. CAD is the buildup of cholesterol and fat (plaque) in the arteries of the heart.  Heart attack, also called myocardial infarction. This injures the heart muscle, making it hard for the heart to pump blood.  Abnormal heart valves. The valves do not open and close properly, forcing the heart to pump harder to keep the blood flowing.  Heart muscle disease (cardiomyopathy or myocarditis). This is damage to the heart muscle. It can increase the risk of heart failure.  Lung disease. The heart works harder when the lungs are not healthy.  Abnormal heart rhythms. These can lead to heart failure. What increases the risk? The risk of heart failure increases as a person ages. This condition is also more likely to develop in people who:  Are overweight.  Are female.  Smoke or chew tobacco.  Abuse alcohol or illegal drugs.  Have taken medicines that can damage the heart, such as chemotherapy drugs.  Have diabetes.  Have abnormal heart rhythms.  Have thyroid problems.  Have low blood  counts (anemia). What are the signs or symptoms? Symptoms of this condition include:  Shortness of breath with activity, such as when climbing stairs.  A cough that does not go away.  Swelling of the feet, ankles, legs, or abdomen.  Losing weight for no reason.  Trouble breathing when lying flat (orthopnea).  Waking from sleep because of the need to sit up and get more air.  Rapid heartbeat.  Tiredness (fatigue) and loss of energy.  Feeling light-headed, dizzy, or close to fainting.  Loss of appetite.  Nausea.  Waking up more often during the night to urinate (nocturia).  Confusion. How is this diagnosed? This condition is diagnosed based on:  Your medical history, symptoms, and a physical exam.  Diagnostic tests, which may include: ? Echocardiogram. ? Electrocardiogram (ECG). ? Chest X-ray. ? Blood tests. ? Exercise  stress test. ? Radionuclide scans. ? Cardiac catheterization and angiogram. How is this treated? Treatment for this condition is aimed at managing the symptoms of heart failure. Medicines Treatment may include medicines that:  Help lower blood pressure by relaxing (dilating) the blood vessels. These medicines are called ACE inhibitors (angiotensin-converting enzyme) and ARBs (angiotensin receptor blockers).  Cause the kidneys to remove salt and water from the blood through urination (diuretics).  Improve heart muscle strength and prevent the heart from beating too fast (beta blockers).  Increase the force of the heartbeat (digoxin). Healthy behavior changes     Treatment may also include making healthy lifestyle changes, such as:  Reaching and staying at a healthy weight.  Quitting smoking or chewing tobacco.  Eating heart-healthy foods.  Limiting or avoiding alcohol.  Stopping the use of illegal drugs.  Being physically active.  Other treatments Other treatments may include:  Procedures to open blocked arteries or repair damaged  valves.  Placing a pacemaker to improve heart function (cardiac resynchronization therapy).  Placing a device to treat serious abnormal heart rhythms (implantable cardioverter defibrillator, or ICD).  Placing a device to improve the pumping ability of the heart (left ventricular assist device, or LVAD).  Receiving a healthy heart from a donor (heart transplant). This is done when other treatments have not helped. Follow these instructions at home:  Manage other health conditions as told by your health care provider. These may include hypertension, diabetes, thyroid disease, or abnormal heart rhythms.  Get ongoing education and support as needed. Learn as much as you can about heart failure.  Keep all follow-up visits as told by your health care provider. This is important. Summary  Heart failure is a condition in which the heart has trouble pumping blood because it has become weak or stiff.  This condition is caused by high blood pressure and other diseases of the heart and lungs.  Symptoms of this condition include shortness of breath, tiredness (fatigue), nausea, and swelling of the feet, ankles, legs, or abdomen.  Treatments for this condition may include medicines, lifestyle changes, and surgery.  Manage other health conditions as told by your health care provider. This information is not intended to replace advice given to you by your health care provider. Make sure you discuss any questions you have with your health care provider. Document Released: 05/06/2005 Document Revised: 07/24/2018 Document Reviewed: 07/24/2018 Elsevier Patient Education  2020 [employer].

## 2019-03-18 NOTE — TOC Transition Note (Signed)
Transition of Care [facility]) - CM/SW Discharge Note   Patient Details  Name: Ann Bernard MRN: [id number] Date of Birth: June 30, 1945  Transition of Care [facility]) CM/SW Contact:  Zenon Mayo, RN Phone Number: 03/18/2019, 3:16 PM   Clinical Narrative:    Patient for dc today, [facility] filled Delene Loll for her,  NCM informed her that her Cardiologist will need to call in prior auth for her refill and gave her the prior auth phone number to have MD call. [phone].   Final next level of care: Home/Self Care Barriers to Discharge: No Barriers Identified   Patient Goals and CMS Choice Patient states their goals for this hospitalization and ongoing recovery are:: get better   Choice offered to / list presented to : NA  Discharge Placement                       Discharge Plan and Services                DME Arranged: (NA)         HH Arranged: NA          Social Determinants of Health (SDOH) Interventions     Readmission Risk Interventions No flowsheet data found.

## 2019-03-18 NOTE — Progress Notes (Signed)
Patient ID: Ann Bernard, female   DOB: 08/12/1945, 73 y.o.   MRN: [id number]     Advanced Heart Failure Rounding Note  PCP-Cardiologist: No primary care provider on file.   Subjective:    No dyspnea today.  Has walked around her room.  SBP 90s-100s, not lightheaded.   Cardiac MRI: EF 13%, moderate LV dilation, septal-lateral dyssynchrony c/w LBBB and diffuse severe hypokinesis, normal RV size with moderate systolic dysfunction EF 58%, mild-moderate MR, moderate AI, LGE at inferior RV insertion site (nonspecific).    Objective:   Weight Range: 78.9 kg Body mass index is 27.25 kg/m.   Vital Signs:   Temp:  [97.6 F (36.4 C)-98 F (36.7 C)] 98 F (36.7 C) (10/29 1215) Pulse Rate:  [62-88] 77 (10/29 1215) Resp:  [17-24] 19 (10/29 1215) BP: (94-130)/(54-76) 94/70 (10/29 1215) SpO2:  [97 %-99 %] 99 % (10/29 1215) Weight:  [78.9 kg] 78.9 kg (10/29 0512) Last BM Date: 03/15/19  Weight change: Filed Weights   03/14/19 1715 03/18/19 0512  Weight: 90.7 kg 78.9 kg    Intake/Output:   Intake/Output Summary (Last 24 hours) at 03/18/2019 1320 Last data filed at 03/18/2019 1218 Gross per 24 hour  Intake 1026 ml  Output 925 ml  Net 101 ml      Physical Exam    General:  Well appearing. No resp difficulty HEENT: Normal Neck: Supple. JVP not elevated. Carotids 2+ bilat; no bruits. No lymphadenopathy or thyromegaly appreciated. Cor: PMI nondisplaced. Regular rate & rhythm. No rubs, gallops. 2/6 early SEM RUSB.  Lungs: Clear Abdomen: Soft, nontender, nondistended. No hepatosplenomegaly. No bruits or masses. Good bowel sounds. Extremities: No cyanosis, clubbing, rash, edema Neuro: Alert & orientedx3, cranial nerves grossly intact. moves all 4 extremities w/o difficulty. Affect pleasant   Telemetry   NSR (personally reviewed)  Labs    CBC Recent Labs    03/16/19 0251  03/16/19 1525 03/17/19 0323  WBC 6.5  --   --  6.3  HGB 14.4   < > 13.9 14.3  HCT 43.4   < > 41.0  44.0  MCV 98.2  --   --  99.3  PLT 161  --   --  150   < > = values in this interval not displayed.   Basic Metabolic Panel Recent Labs    03/17/19 0323 03/18/19 0228  NA 141 139  K 3.3* 3.3*  CL 106 104  CO2 25 24  GLUCOSE 97 107*  BUN 14 17  CREATININE 0.77 0.84  CALCIUM 9.0 8.5*   Liver Function Tests No results for input(s): AST, ALT, ALKPHOS, BILITOT, PROT, ALBUMIN in the last 72 hours. No results for input(s): LIPASE, AMYLASE in the last 72 hours. Cardiac Enzymes No results for input(s): CKTOTAL, CKMB, CKMBINDEX, TROPONINI in the last 72 hours.  BNP: BNP (last 3 results) Recent Labs    03/14/19 1600  BNP 1,035.7*    ProBNP (last 3 results) No results for input(s): PROBNP in the last 8760 hours.   D-Dimer No results for input(s): DDIMER in the last 72 hours. Hemoglobin A1C Recent Labs    03/16/19 0251  HGBA1C 5.5   Fasting Lipid Panel Recent Labs    03/16/19 0251  CHOL 194  HDL 53  LDLCALC 128*  TRIG 65  CHOLHDL 3.7   Thyroid Function Tests Recent Labs    03/17/19 1323  TSH 2.833    Other results:   Imaging    Mr Cardiac Morphology W Wo Contrast  Result Date: 03/18/2019 CLINICAL DATA:  Nonischemic cardiomyopathy. EXAM: CARDIAC MRI TECHNIQUE: The patient was scanned on a 1.5 Tesla GE magnet. A dedicated cardiac coil was used. Functional imaging was done using Fiesta sequences. 2,3, and 4 chamber views were done to assess for RWMA's. Modified Simpson's rule using a short axis stack was used to calculate an ejection fraction on a dedicated work [employer]. The patient received 7 cc of Gadavist. After 10 minutes inversion recovery sequences were used to assess for infiltration and scar tissue. FINDINGS: Limited images of the lung fields showed no gross abnormalities. Small circumferential pericardial effusion. Moderately dilated left ventricle with normal wall thickness. Diffuse severe hypokinesis with septal-lateral  dyssynchrony consistent with LBBB, EF 13%. No LV thrombus noted. Normal right ventricular size with moderate systolic dysfunction, EF 30%. Mild left atrial enlargement. Normal right atrial size. Mild-moderate mitral regurgitation. The aortic valve is trileaflet. No significant aortic stenosis. Visually, there appears to be moderate aortic regurgitation. Delayed enhancement imaging showed subepicardial late gadolinium enhancement (LGE) in the basal to mid inferoseptal wall at the inferior RV insertion site. Measurements: LVEDV 317 mL LVSV 42 mL LVEF 13% RVEDV 77 mL RVSV 23 mL RVEF 30% IMPRESSION: 1.  Small pericardial effusion. 2. Moderately dilated LV with EF 13%, diffuse hypokinesis with septal-lateral dyssynchrony. 3. Normal RV size with moderately decreased systolic function, EF 30%. 4.  Mild to moderate MR, moderate AI. 5. Nonspecific RV insertion site LGE, this can be seen in the setting of LV pressure overload. Ann Bernard Electronically Signed   By: Marca Ancona M.D.   On: 03/18/2019 13:13      Medications:     Scheduled Medications:  atorvastatin  40 mg Oral q1800   carvedilol  3.125 mg Oral BID WC   digoxin  0.125 mg Oral Daily   famotidine  10 mg Oral Daily   [START ON 03/19/2019] furosemide  20 mg Oral Daily   heparin  5,000 Units Subcutaneous Q8H   potassium chloride  20 mEq Oral BID   sacubitril-valsartan  1 tablet Oral BID   sodium chloride flush  3 mL Intravenous Q12H   sodium chloride flush  3 mL Intravenous Q12H     Infusions:  sodium chloride     sodium chloride       PRN Medications:  sodium chloride, sodium chloride, acetaminophen, morphine injection, ondansetron **OR** ondansetron (ZOFRAN) IV, oxyCODONE, sodium chloride flush, sodium chloride flush, zolpidem   Assessment/Plan   1. Acute systolic CHF: Nonischemic cardiomyopathy. Echo with EF < 20%.  LHC/RHC showed nonobstructive coronary disease, CI 2.3.  Cardiac MRI showed EF 13%, moderate LV  dilation, septal-lateral dyssynchrony c/w LBBB and diffuse severe hypokinesis, normal RV size with moderate systolic dysfunction EF 30%, mild-moderate MR, moderate AI, LGE at inferior RV insertion site (nonspecific). Symptoms only for a couple of weeks prior to admission.  No prior viral-type syndrome.  She does have a LBBB that is new compared to 2016, around 150 msec.  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  No dyspnea now, she does not look volume overloaded on exam.  No BP room to titrate meds but not lightheaded.  - Stop IV Lasix, can start Lasix 20 mg po daily tomorrow.  - Continue digoxin 0.125.  - Continue Coreg 3.125 mg bid. - Continue Entresto 24/26 bid.  - Eventually will need spironolactone but no BP room at this time.  - If LV function does not improve with medical management, will  likely be a CRT candidate with wide LBBB. 2. CAD: Nonobstructive CAD.  - She is on atorvastatin.  3. Active smoker; Needs to quit.  4. ?OSA: Outpatient sleep study.   I think she could go home today. Will arrange for followup in CHF clinic.  Meds for home: Lasix 20 mg po daily, KCl 10 mEq daily, Coreg 3.125 mg bid, atorvastatin 40 daily, Entresto 24/26 bid, digoxin 0.125 daily.   Length of Stay: 4  Marca Ancona, MD  03/18/2019, 1:20 PM  Advanced Heart Failure Team Pager [phone] (M-F; 7a - 4p)  Please contact [facility] for night-coverage after hours (4p -7a ) and weekends on [email or web address]

## 2019-03-19 ENCOUNTER — Telehealth (HOSPITAL_COMMUNITY): Payer: Self-pay | Admitting: Pharmacy Technician

## 2019-03-19 NOTE — Telephone Encounter (Signed)
Received a message regarding Ann Bernard co-pay and possible prior authorization needed.  Checked and Ann Bernard does not require a prior authorization at this time. Co-pay is $8.95  Will assist in the future if needed.  Charlann Boxer, CPhT

## 2019-03-20 LAB — CULTURE, BLOOD (ROUTINE X 2)
Culture: NO GROWTH
Culture: NO GROWTH
Special Requests: ADEQUATE
Special Requests: ADEQUATE

## 2019-03-25 ENCOUNTER — Ambulatory Visit (HOSPITAL_COMMUNITY)
Admit: 2019-03-25 | Discharge: 2019-03-25 | Disposition: A | Discharge status: Home / Self Care | Payer: Medicare HMO | Source: Ambulatory Visit | Attending: Internal Medicine | Admitting: Internal Medicine

## 2019-03-25 ENCOUNTER — Encounter (HOSPITAL_COMMUNITY): Payer: Self-pay

## 2019-03-25 ENCOUNTER — Other Ambulatory Visit: Payer: Self-pay

## 2019-03-25 VITALS — BP 108/68 | HR 76 | Wt 171.0 lb

## 2019-03-25 DIAGNOSIS — I447 Left bundle-branch block, unspecified: Secondary | ICD-10-CM | POA: Insufficient documentation

## 2019-03-25 DIAGNOSIS — Z7901 Long term (current) use of anticoagulants: Secondary | ICD-10-CM | POA: Insufficient documentation

## 2019-03-25 DIAGNOSIS — I2510 Atherosclerotic heart disease of native coronary artery without angina pectoris: Secondary | ICD-10-CM | POA: Insufficient documentation

## 2019-03-25 DIAGNOSIS — Z79899 Other long term (current) drug therapy: Secondary | ICD-10-CM | POA: Diagnosis not present

## 2019-03-25 DIAGNOSIS — Z87891 Personal history of nicotine dependence: Secondary | ICD-10-CM | POA: Diagnosis not present

## 2019-03-25 DIAGNOSIS — I110 Hypertensive heart disease with heart failure: Secondary | ICD-10-CM | POA: Diagnosis not present

## 2019-03-25 DIAGNOSIS — I313 Pericardial effusion (noninflammatory): Secondary | ICD-10-CM | POA: Diagnosis not present

## 2019-03-25 DIAGNOSIS — Z8249 Family history of ischemic heart disease and other diseases of the circulatory system: Secondary | ICD-10-CM | POA: Diagnosis not present

## 2019-03-25 DIAGNOSIS — I5022 Chronic systolic (congestive) heart failure: Secondary | ICD-10-CM | POA: Insufficient documentation

## 2019-03-25 DIAGNOSIS — F17200 Nicotine dependence, unspecified, uncomplicated: Secondary | ICD-10-CM | POA: Diagnosis not present

## 2019-03-25 DIAGNOSIS — I428 Other cardiomyopathies: Secondary | ICD-10-CM | POA: Insufficient documentation

## 2019-03-25 DIAGNOSIS — I5021 Acute systolic (congestive) heart failure: Secondary | ICD-10-CM

## 2019-03-25 DIAGNOSIS — E785 Hyperlipidemia, unspecified: Secondary | ICD-10-CM | POA: Insufficient documentation

## 2019-03-25 DIAGNOSIS — I272 Pulmonary hypertension, unspecified: Secondary | ICD-10-CM | POA: Insufficient documentation

## 2019-03-25 LAB — BASIC METABOLIC PANEL
Anion gap: 10 (ref 5–15)
BUN: 19 mg/dL (ref 8–23)
CO2: 23 mmol/L (ref 22–32)
Calcium: 9.1 mg/dL (ref 8.9–10.3)
Chloride: 108 mmol/L (ref 98–111)
Creatinine, Ser: 1.17 mg/dL — ABNORMAL HIGH (ref 0.44–1.00)
GFR calc Af Amer: 54 mL/min — ABNORMAL LOW (ref >60)
GFR calc non Af Amer: 46 mL/min — ABNORMAL LOW (ref >60)
Glucose, Bld: 111 mg/dL — ABNORMAL HIGH (ref 70–99)
Potassium: 4.4 mmol/L (ref 3.5–5.1)
Sodium: 141 mmol/L (ref 135–145)

## 2019-03-25 MED ORDER — SPIRONOLACTONE 25 MG PO TABS: 12.5000 mg | ORAL_TABLET | Freq: Every day | ORAL | 3 refills | Status: DC

## 2019-03-25 NOTE — Progress Notes (Addendum)
As patient answered No to most of sleep study questions, does not qualify.  Amy NP made aware. Test cancelled

## 2019-03-25 NOTE — Patient Instructions (Addendum)
START Spironolactone 12.5mg  (1/2 tab) daily  Labs today and repeat in 1 week We will only contact you if something comes back abnormal or we need to make some changes. Otherwise no news is good news!  CANCEL tomorrows appointment with Lauretta Chester   Your provider has recommended that you have a home sleep study.  [employer] is the company that does these test.  They will contact you by phone and must speak with you before they can ship the equipment.  Once they have spoken with you they will send the equipment right to your home with instructions on how to set it up.  Once you have completed the test simply box all the equipment back up and mail back to the company.  IF you have any questions or issues with the equipment please call the company directly at [phone].  If your test is positive for sleep apnea and you need a home CPAP machine you will be contacted by Dr Theodosia Blender office [facility]) to set this up.   Your physician has requested that you have an echocardiogram. Echocardiography is a painless test that uses sound waves to create images of your heart. It provides your doctor with information about the size and shape of your heart and how well your heart's chambers and valves are working. This procedure takes approximately one hour. There are no restrictions for this procedure.   Your physician recommends that you schedule a follow-up appointment in: 2 weeks with Nurse Practitioner and in 3 months with Dr Aundra Dubin with an ECHO    At the [facility], you and your health needs are our priority. As part of our continuing mission to provide you with exceptional heart care, we have created designated Provider Care Teams. These Care Teams include your primary Cardiologist (physician) and Advanced Practice Providers (APPs- Physician Assistants and Nurse Practitioners) who all work together to provide you with the care you need, when you need it.   You may see any of  the following providers on your designated Care Team at your next follow up: Marland Kitchen Dr Glori Bickers . Dr Loralie Champagne . Darrick Grinder, NP . Lyda Jester, PA   Please be sure to bring in all your medications bottles to every appointment.

## 2019-03-25 NOTE — Progress Notes (Signed)
PCP: Konrad Saha PA Primary HF Cardiologist: Dr Shirlee Latch   HPI:  Ann Bernard is a 73 year old with history of HTN, hyperlipidemia, tobacco abuse, and recently diagnosed systolic heart failure. Of note her brother has heart failure.   Admitted to [facility] with acute systolic heart failure. CTA negative for PE. ECHO was completed and showed severely reduced EF. Diagnostic cath was performed as noted below. Diuresed with IV lasix and transitioned to lasix 20 mg daily. Started on digoxin, carvedilol, and entresto. Discharge weight 174 pounds;   Today she returns for post hospital follow up.  Today she returns for HF follow up.Overall feeling fine. Denies SOB/PND/Orthopnea. Able to walk around the store without difficulty.  Appetite ok. No fever or chills. Weight at home 169-171pounds. Taking all medications. Lives with her son.   RHC/LHC  03/16/19  Right dominant coronary anatomy.  Normal left main.  Widely patent LAD with each of 2 diagonals having 50% ostial narrowing.  Widely patent circumflex.  Minimal luminal irregularities in the proximal mid and distal RCA.  Elevated left ventricular end-diastolic pressure, greater than 20 mmHg.  Mild pulmonary hypertension with mean pressure 33 mmHg. Pulmonary capillary wedge mean 23 mmHg. V wave 33 mmHg. CO 4.7/CI 2.34    Echo 03/16/19   Left ventricular ejection fraction, by visual estimation, is <20%. The left ventricle has severely decreased function. Moderately increased left ventricular size. There is no left ventricular hypertrophy. There is severe global hypokinesis.  CMRI 03/09/19  1.  Small pericardial effusion. 2. Moderately dilated LV with EF 13%, diffuse hypokinesis with septal-lateral dyssynchrony. 3. Normal RV size with moderately decreased systolic function, EF 30%. 4.  Mild to moderate MR, moderate AI. 5. Nonspecific RV insertion site LGE, this can be seen in the setting of LV pressure overload.  ROS: All systems  negative except as listed in HPI, PMH and Problem List.  SH:  Social History   Socioeconomic History  . Marital status: Widowed    Spouse name: Not on file  . Number of children: Not on file  . Years of education: Not on file  . Highest education level: Not on file  Occupational History  . Not on file  Social Needs  . Financial resource strain: Not hard at all  . Food insecurity    Worry: Never true    Inability: Never true  . Transportation needs    Medical: No    Non-medical: No  Tobacco Use  . Smoking status: Current Some Day Smoker  . Smokeless tobacco: Never Used  Substance and Sexual Activity  . Alcohol use: No  . Drug use: Never  . Sexual activity: Not Currently  Lifestyle  . Physical activity    Days per week: 0 days    Minutes per session: Not on file  . Stress: Not at all  Relationships  . Social connections    Talks on phone: More than three times a week    Gets together: Once a week    Attends religious service: More than 4 times per year    Active member of club or organization: No    Attends meetings of clubs or organizations: Never    Relationship status: Widowed  . Intimate partner violence    Fear of current or ex partner: No    Emotionally abused: No    Physically abused: No    Forced sexual activity: No  Other Topics Concern  . Not on file  Social History Narrative  . Not  on file    FH:  Family History  Problem Relation Age of Onset  . Hypertension Mother   . Diabetes Mother   . Diabetes Brother   . Alcoholism Father   . Diabetes type I Grandson     Past Medical History:  Diagnosis Date  . Coronary artery disease   . Hypertension     Current Outpatient Medications  Medication Sig Dispense Refill  . alendronate (FOSAMAX) 70 MG tablet Take 70 mg by mouth once a week.    Marland Kitchen atorvastatin (LIPITOR) 40 MG tablet Take 1 tablet (40 mg total) by mouth daily at 6 PM. 30 tablet 0  . bisacodyl (DULCOLAX) 5 MG EC tablet Take 5 mg by mouth  daily as needed for moderate constipation.    . carvedilol (COREG) 3.125 MG tablet Take 1 tablet (3.125 mg total) by mouth 2 (two) times daily with a meal. 60 tablet 0  . digoxin (LANOXIN) 0.125 MG tablet Take 1 tablet (0.125 mg total) by mouth daily. 30 tablet 0  . diphenhydrAMINE (BENADRYL) 25 MG tablet Take 25 mg by mouth every 6 (six) hours as needed for itching.    . furosemide (LASIX) 20 MG tablet Take 1 tablet (20 mg total) by mouth daily. 30 tablet 0  . Multiple Vitamins-Minerals (PRESERVISION AREDS 2 PO) Take 1 capsule by mouth daily.    . potassium chloride SA (KLOR-CON) 10 MEQ tablet Take 1 tablet (10 mEq total) by mouth daily. 30 tablet 0  . sacubitril-valsartan (ENTRESTO) 24-26 MG Take 1 tablet by mouth 2 (two) times daily. 60 tablet 3   No current facility-administered medications for this encounter.     Vitals:   03/25/19 1353  BP: 108/68  Pulse: 76  SpO2: 98%  Weight: 77.6 kg (171 lb)   Wt Readings from Last 3 Encounters:  03/25/19 77.6 kg (171 lb)  03/18/19 78.9 kg (174 lb)  05/07/15 87.1 kg (192 lb)   PHYSICAL EXAM:  General:  Well appearing. No resp difficulty HEENT: normal Neck: supple. JVP flat. Carotids 2+ bilaterally; no bruits. No lymphadenopathy or thryomegaly appreciated. Cor: PMI normal. Regular rate & rhythm. No rubs, gallops or murmurs. Lungs: clear Abdomen: soft, nontender, nondistended. No hepatosplenomegaly. No bruits or masses. Good bowel sounds. Extremities: no cyanosis, clubbing, rash, edema Neuro: alert & orientedx3, cranial nerves grossly intact. Moves all 4 extremities w/o difficulty. Affect pleasant.    ASSESSMENT & PLAN: 1. Chronic systolic CHF: Nonischemic cardiomyopathy. Echo with EF < 20%.  LHC/RHC showed nonobstructive coronary disease, CI 2.3.  Cardiac MRI showed EF 13%, moderate LV dilation, septal-lateral dyssynchrony c/w LBBB and diffuse severe hypokinesis, normal RV size with moderate systolic dysfunction EF 69%, mild-moderate MR,  moderate AI, LGE at inferior RV insertion site (nonspecific). Symptoms only for a couple of weeks prior to admission. No prior viral-type syndrome. She does have a LBBB that is new compared to 2016, around 150 msec. 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  - Plan to repeat ECHO after HF meds optimized 3-4 months.   - NYHA II.  Volume status . Lasix 20 mg po daily - Continue digoxin 0.125.  - Continue Coreg 3.125 mg bid. - Continue Entresto 24/26 bid.  - Add 12.5 mg spironolactone daily. Check BMET today and in 7 days  - If LV function does not improve with medical management, will likely be a CRT candidate with wide LBBB. 2. CAD: Nonobstructive CAD.  - No chest pain.  - She is on  atorvastatin.  3. Former Heavy Smoker- quit about 1 week ago.   4. ?OSA: Outpatient sleep study. We discussed and she would like to hold off.   I reviewed D/C summary. Discussed heart failure, medications, an d plan for upcoming visit.  Follow up in 2 weeks. Set up ECHO in 3 months with Dr Shirlee Latch. Greater than 50% of the (total minutes 25) visit spent in counseling/coordination of care regarding the above.    Jmichael Gille NP-C   3:54 PM

## 2019-03-26 ENCOUNTER — Ambulatory Visit: Payer: Medicare HMO | Admitting: Physician Assistant

## 2019-04-01 ENCOUNTER — Other Ambulatory Visit: Payer: Self-pay

## 2019-04-01 ENCOUNTER — Ambulatory Visit (HOSPITAL_COMMUNITY)
Admission: RE | Admit: 2019-04-01 | Discharge: 2019-04-01 | Disposition: A | Discharge status: Home / Self Care | Payer: Medicare HMO | Source: Ambulatory Visit | Attending: Internal Medicine | Admitting: Internal Medicine

## 2019-04-01 DIAGNOSIS — I5021 Acute systolic (congestive) heart failure: Secondary | ICD-10-CM | POA: Insufficient documentation

## 2019-04-01 LAB — BASIC METABOLIC PANEL
Anion gap: 11 (ref 5–15)
BUN: 24 mg/dL — ABNORMAL HIGH (ref 8–23)
CO2: 25 mmol/L (ref 22–32)
Calcium: 9.5 mg/dL (ref 8.9–10.3)
Chloride: 105 mmol/L (ref 98–111)
Creatinine, Ser: 1.05 mg/dL — ABNORMAL HIGH (ref 0.44–1.00)
GFR calc Af Amer: >60 mL/min (ref >60)
GFR calc non Af Amer: 53 mL/min — ABNORMAL LOW (ref >60)
Glucose, Bld: 112 mg/dL — ABNORMAL HIGH (ref 70–99)
Potassium: 4.3 mmol/L (ref 3.5–5.1)
Sodium: 141 mmol/L (ref 135–145)

## 2019-04-08 ENCOUNTER — Encounter (HOSPITAL_COMMUNITY): Payer: Self-pay

## 2019-04-08 ENCOUNTER — Other Ambulatory Visit: Payer: Self-pay

## 2019-04-08 ENCOUNTER — Ambulatory Visit (HOSPITAL_COMMUNITY)
Admission: RE | Admit: 2019-04-08 | Discharge: 2019-04-08 | Disposition: A | Discharge status: Home / Self Care | Payer: Medicare HMO | Source: Ambulatory Visit | Attending: Adult Health | Admitting: Adult Health

## 2019-04-08 VITALS — BP 116/84 | HR 68 | Wt 171.2 lb

## 2019-04-08 DIAGNOSIS — I447 Left bundle-branch block, unspecified: Secondary | ICD-10-CM | POA: Insufficient documentation

## 2019-04-08 DIAGNOSIS — I428 Other cardiomyopathies: Secondary | ICD-10-CM | POA: Diagnosis not present

## 2019-04-08 DIAGNOSIS — I2510 Atherosclerotic heart disease of native coronary artery without angina pectoris: Secondary | ICD-10-CM | POA: Insufficient documentation

## 2019-04-08 DIAGNOSIS — Z8249 Family history of ischemic heart disease and other diseases of the circulatory system: Secondary | ICD-10-CM | POA: Insufficient documentation

## 2019-04-08 DIAGNOSIS — I272 Pulmonary hypertension, unspecified: Secondary | ICD-10-CM | POA: Insufficient documentation

## 2019-04-08 DIAGNOSIS — I110 Hypertensive heart disease with heart failure: Secondary | ICD-10-CM | POA: Diagnosis not present

## 2019-04-08 DIAGNOSIS — I5022 Chronic systolic (congestive) heart failure: Secondary | ICD-10-CM | POA: Diagnosis not present

## 2019-04-08 DIAGNOSIS — I5021 Acute systolic (congestive) heart failure: Secondary | ICD-10-CM

## 2019-04-08 DIAGNOSIS — I313 Pericardial effusion (noninflammatory): Secondary | ICD-10-CM | POA: Diagnosis not present

## 2019-04-08 DIAGNOSIS — Z7901 Long term (current) use of anticoagulants: Secondary | ICD-10-CM | POA: Diagnosis not present

## 2019-04-08 DIAGNOSIS — Z87891 Personal history of nicotine dependence: Secondary | ICD-10-CM | POA: Insufficient documentation

## 2019-04-08 DIAGNOSIS — E785 Hyperlipidemia, unspecified: Secondary | ICD-10-CM | POA: Insufficient documentation

## 2019-04-08 DIAGNOSIS — Z79899 Other long term (current) drug therapy: Secondary | ICD-10-CM | POA: Diagnosis not present

## 2019-04-08 MED ORDER — FUROSEMIDE 20 MG PO TABS: 20.0000 mg | ORAL_TABLET | ORAL | 0 refills | Status: DC | PRN

## 2019-04-08 MED ORDER — SPIRONOLACTONE 25 MG PO TABS: 25.0000 mg | ORAL_TABLET | Freq: Every day | ORAL | 3 refills | Status: DC

## 2019-04-08 MED ORDER — POTASSIUM CHLORIDE CRYS ER 10 MEQ PO TBCR: EXTENDED_RELEASE_TABLET | ORAL | 0 refills | Status: DC

## 2019-04-08 NOTE — Patient Instructions (Addendum)
INCREASE Spironolactone to 25mg  daily.  Take Lasix and Potassium only as needed.   START Farxiga 10mg  daily.  Routine lab work today. Will notify you of abnormal results  Repeat in 1 week.    Follow up in 4 weeks.

## 2019-04-08 NOTE — Progress Notes (Signed)
PCP: Luiz Ochoa PA Primary HF Cardiologist: Dr Aundra Dubin   HPI:  [name] is a 73 year old with history of HTN, hyperlipidemia, tobacco abuse, and recently diagnosed systolic heart failure. Of note her brother has heart failure.   Admitted to [facility] with acute systolic heart failure. CTA negative for PE. ECHO was completed and showed severely reduced EF. Diagnostic cath was performed as noted below. Diuresed with IV lasix and transitioned to lasix 20 mg daily. Started on digoxin, carvedilol, and entresto. Discharge weight 174 pounds;   Today she returns for HF follow up. Last visit spiro was started. Overall feeling fine. Denies SOB/PND/Orthopnea. No chest pain.  Appetite ok. Able to walk around the grocery store. No fever or chills. Weight at home 165  pounds. Taking all medications. Son provides transportation to appointments.    RHC/LHC  03/16/19  Right dominant coronary anatomy.  Normal left main.  Widely patent LAD with each of 2 diagonals having 50% ostial narrowing.  Widely patent circumflex.  Minimal luminal irregularities in the proximal mid and distal RCA.  Elevated left ventricular end-diastolic pressure, greater than 20 mmHg.  Mild pulmonary hypertension with mean pressure 33 mmHg. Pulmonary capillary wedge mean 23 mmHg. V wave 33 mmHg. CO 4.7/CI 2.34    Echo 03/16/19   Left ventricular ejection fraction, by visual estimation, is <20%. The left ventricle has severely decreased function. Moderately increased left ventricular size. There is no left ventricular hypertrophy. There is severe global hypokinesis.  CMRI 03/09/19  1.  Small pericardial effusion. 2. Moderately dilated LV with EF 13%, diffuse hypokinesis with septal-lateral dyssynchrony. 3. Normal RV size with moderately decreased systolic function, EF 57%. 4.  Mild to moderate MR, moderate AI. 5. Nonspecific RV insertion site LGE, this can be seen in the setting of LV pressure overload.  ROS: All  systems negative except as listed in HPI, PMH and Problem List.  SH:  Social History   Socioeconomic History  . Marital status: Widowed    Spouse name: Not on file  . Number of children: Not on file  . Years of education: Not on file  . Highest education level: Not on file  Occupational History  . Not on file  Social Needs  . Financial resource strain: Not hard at all  . Food insecurity    Worry: Never true    Inability: Never true  . Transportation needs    Medical: No    Non-medical: No  Tobacco Use  . Smoking status: Current Some Day Smoker  . Smokeless tobacco: Never Used  Substance and Sexual Activity  . Alcohol use: No  . Drug use: Never  . Sexual activity: Not Currently  Lifestyle  . Physical activity    Days per week: 0 days    Minutes per session: Not on file  . Stress: Not at all  Relationships  . Social connections    Talks on phone: More than three times a week    Gets together: Once a week    Attends religious service: More than 4 times per year    Active member of club or organization: No    Attends meetings of clubs or organizations: Never    Relationship status: Widowed  . Intimate partner violence    Fear of current or ex partner: No    Emotionally abused: No    Physically abused: No    Forced sexual activity: No  Other Topics Concern  . Not on file  Social History Narrative  .  Not on file    FH:  Family History  Problem Relation Age of Onset  . Hypertension Mother   . Diabetes Mother   . Diabetes Brother   . Alcoholism Father   . Diabetes type I Grandson     Past Medical History:  Diagnosis Date  . Coronary artery disease   . Hypertension     Current Outpatient Medications  Medication Sig Dispense Refill  . atorvastatin (LIPITOR) 40 MG tablet Take 1 tablet (40 mg total) by mouth daily at 6 PM. 30 tablet 0  . bisacodyl (DULCOLAX) 5 MG EC tablet Take 5 mg by mouth daily as needed for moderate constipation.    . carvedilol (COREG)  3.125 MG tablet Take 1 tablet (3.125 mg total) by mouth 2 (two) times daily with a meal. 60 tablet 0  . digoxin (LANOXIN) 0.125 MG tablet Take 1 tablet (0.125 mg total) by mouth daily. 30 tablet 0  . diphenhydrAMINE (BENADRYL) 25 MG tablet Take 25 mg by mouth every 6 (six) hours as needed for itching.    . furosemide (LASIX) 20 MG tablet Take 1 tablet (20 mg total) by mouth daily. 30 tablet 0  . Multiple Vitamins-Minerals (PRESERVISION AREDS 2 PO) Take 1 capsule by mouth daily.    . potassium chloride SA (KLOR-CON) 10 MEQ tablet Take 1 tablet (10 mEq total) by mouth daily. 30 tablet 0  . sacubitril-valsartan (ENTRESTO) 24-26 MG Take 1 tablet by mouth 2 (two) times daily. 60 tablet 3  . spironolactone (ALDACTONE) 25 MG tablet Take 0.5 tablets (12.5 mg total) by mouth daily. 45 tablet 3   No current facility-administered medications for this encounter.     Vitals:   04/08/19 1019  BP: 116/84  Pulse: 68  SpO2: 100%  Weight: 77.7 kg (171 lb 3.2 oz)   Wt Readings from Last 3 Encounters:  04/08/19 77.7 kg (171 lb 3.2 oz)  03/25/19 77.6 kg (171 lb)  03/18/19 78.9 kg (174 lb)   PHYSICAL EXAM: General:  Well appearing. No resp difficulty HEENT: normal Neck: supple. JVP flat. Carotids 2+ bilaterally; no bruits. No lymphadenopathy or thryomegaly appreciated. Cor: PMI normal. Regular rate & rhythm. No rubs, gallops or murmurs. Lungs: clear Abdomen: soft, nontender, nondistended. No hepatosplenomegaly. No bruits or masses. Good bowel sounds. Extremities: no cyanosis, clubbing, rash, edema Neuro: alert & orientedx3, cranial nerves grossly intact. Moves all 4 extremities w/o difficulty. Affect pleasant.   ASSESSMENT & PLAN: 1. Chronic systolic CHF: Nonischemic cardiomyopathy. Echo with EF < 20%.  LHC/RHC showed nonobstructive coronary disease, CI 2.3.  Cardiac MRI showed EF 13%, moderate LV dilation, septal-lateral dyssynchrony c/w LBBB and diffuse severe hypokinesis, normal RV size with  moderate systolic dysfunction EF 30%, mild-moderate MR, moderate AI, LGE at inferior RV insertion site (nonspecific). Symptoms only for a couple of weeks prior to admission. No prior viral-type syndrome. She does have a LBBB that is new compared to 2016, around 150 msec. 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  - Plan to repeat ECHO after HF meds optimized 3-4 months.  Appointment set up for February.  - NYHA II. Volume status stable. Change lasix to as needed.  - In past she was on benicar. For some reason it is on her med list. Discussed today and asked her to make sure she isnt taking.  - Continue digoxin 0.125.  - Continue Coreg 3.125 mg bid. - Continue Entresto 24/26 bid.  - Increase spironolactone to 25 mg daily.  -  Add farxiga 10 mg daily.  - Check BMET today and in 7 days.  - If LV function does not improve with medical management, will likely be a CRT candidate with wide LBBB. 2. CAD: Nonobstructive CAD.  - No chest pain.  - She is on atorvastatin.  3. Former Heavy Smoker-Quit 3 weeks ago.    4. ?OSA: Outpatient sleep study. We discussed and she would like to hold off.   Greater than 50% of the (total minutes 25) visit spent in counseling/coordination of care regarding the above. Discussed medication changes.     Follow up in 4  weeks. Could do virtual visit if needed.  Amy Clegg NP-C   10:56 AM

## 2019-04-09 ENCOUNTER — Other Ambulatory Visit (HOSPITAL_COMMUNITY): Payer: Self-pay

## 2019-04-09 MED ORDER — POTASSIUM CHLORIDE CRYS ER 10 MEQ PO TBCR: EXTENDED_RELEASE_TABLET | ORAL | 0 refills | Status: DC

## 2019-04-10 ENCOUNTER — Other Ambulatory Visit (HOSPITAL_COMMUNITY): Payer: Self-pay | Admitting: Adult Health

## 2019-04-13 ENCOUNTER — Other Ambulatory Visit (HOSPITAL_COMMUNITY): Payer: Self-pay

## 2019-04-13 MED ORDER — POTASSIUM CHLORIDE CRYS ER 10 MEQ PO TBCR: EXTENDED_RELEASE_TABLET | ORAL | 1 refills | Status: DC

## 2019-04-19 ENCOUNTER — Other Ambulatory Visit: Payer: Self-pay

## 2019-04-19 ENCOUNTER — Ambulatory Visit (HOSPITAL_COMMUNITY)
Admission: RE | Admit: 2019-04-19 | Discharge: 2019-04-19 | Disposition: A | Discharge status: Home / Self Care | Payer: Medicare HMO | Source: Ambulatory Visit | Attending: Cardiology | Admitting: Cardiology

## 2019-04-19 ENCOUNTER — Other Ambulatory Visit (HOSPITAL_COMMUNITY): Payer: Self-pay | Admitting: *Deleted

## 2019-04-19 DIAGNOSIS — I5021 Acute systolic (congestive) heart failure: Secondary | ICD-10-CM | POA: Diagnosis not present

## 2019-04-19 LAB — BASIC METABOLIC PANEL
Anion gap: 8 (ref 5–15)
BUN: 14 mg/dL (ref 8–23)
CO2: 27 mmol/L (ref 22–32)
Calcium: 8.7 mg/dL — ABNORMAL LOW (ref 8.9–10.3)
Chloride: 105 mmol/L (ref 98–111)
Creatinine, Ser: 0.78 mg/dL (ref 0.44–1.00)
GFR calc Af Amer: >60 mL/min (ref >60)
GFR calc non Af Amer: >60 mL/min (ref >60)
Glucose, Bld: 104 mg/dL — ABNORMAL HIGH (ref 70–99)
Potassium: 3.9 mmol/L (ref 3.5–5.1)
Sodium: 140 mmol/L (ref 135–145)

## 2019-04-19 MED ORDER — ATORVASTATIN CALCIUM 40 MG PO TABS: 40.0000 mg | ORAL_TABLET | Freq: Every day | ORAL | 3 refills | Status: DC

## 2019-04-19 MED ORDER — SPIRONOLACTONE 25 MG PO TABS: 25.0000 mg | ORAL_TABLET | Freq: Every day | ORAL | 3 refills | Status: DC

## 2019-04-19 MED ORDER — CARVEDILOL 3.125 MG PO TABS: 3.1250 mg | ORAL_TABLET | Freq: Two times a day (BID) | ORAL | 3 refills | Status: DC

## 2019-04-19 MED ORDER — POTASSIUM CHLORIDE CRYS ER 10 MEQ PO TBCR: EXTENDED_RELEASE_TABLET | ORAL | 1 refills | Status: DC

## 2019-04-19 MED ORDER — FUROSEMIDE 20 MG PO TABS: 20.0000 mg | ORAL_TABLET | ORAL | 3 refills | Status: DC | PRN

## 2019-04-19 MED ORDER — DIGOXIN 125 MCG PO TABS: 0.1250 mg | ORAL_TABLET | Freq: Every day | ORAL | 3 refills | Status: DC

## 2019-04-19 MED ORDER — SACUBITRIL-VALSARTAN 24-26 MG PO TABS: 1.0000 | ORAL_TABLET | Freq: Two times a day (BID) | ORAL | 3 refills | Status: DC

## 2019-04-20 ENCOUNTER — Other Ambulatory Visit (HOSPITAL_COMMUNITY): Payer: Self-pay

## 2019-04-20 MED ORDER — POTASSIUM CHLORIDE CRYS ER 10 MEQ PO TBCR: EXTENDED_RELEASE_TABLET | ORAL | 1 refills | Status: DC

## 2019-05-05 ENCOUNTER — Telehealth (HOSPITAL_COMMUNITY): Payer: Self-pay

## 2019-05-05 NOTE — Telephone Encounter (Signed)

## 2019-05-06 ENCOUNTER — Other Ambulatory Visit: Payer: Self-pay

## 2019-05-06 ENCOUNTER — Encounter (HOSPITAL_COMMUNITY): Payer: Self-pay

## 2019-05-06 ENCOUNTER — Ambulatory Visit (HOSPITAL_COMMUNITY)
Admission: RE | Admit: 2019-05-06 | Discharge: 2019-05-06 | Disposition: A | Discharge status: Home / Self Care | Payer: Medicare HMO | Source: Ambulatory Visit | Attending: Adult Health | Admitting: Adult Health

## 2019-05-06 VITALS — BP 126/70 | HR 65 | Wt 171.0 lb

## 2019-05-06 DIAGNOSIS — Z833 Family history of diabetes mellitus: Secondary | ICD-10-CM | POA: Diagnosis not present

## 2019-05-06 DIAGNOSIS — I5020 Unspecified systolic (congestive) heart failure: Secondary | ICD-10-CM | POA: Diagnosis present

## 2019-05-06 DIAGNOSIS — Z87891 Personal history of nicotine dependence: Secondary | ICD-10-CM | POA: Diagnosis not present

## 2019-05-06 DIAGNOSIS — I110 Hypertensive heart disease with heart failure: Secondary | ICD-10-CM | POA: Insufficient documentation

## 2019-05-06 DIAGNOSIS — I428 Other cardiomyopathies: Secondary | ICD-10-CM | POA: Diagnosis not present

## 2019-05-06 DIAGNOSIS — I2510 Atherosclerotic heart disease of native coronary artery without angina pectoris: Secondary | ICD-10-CM | POA: Insufficient documentation

## 2019-05-06 DIAGNOSIS — I5022 Chronic systolic (congestive) heart failure: Secondary | ICD-10-CM

## 2019-05-06 DIAGNOSIS — Z79899 Other long term (current) drug therapy: Secondary | ICD-10-CM | POA: Insufficient documentation

## 2019-05-06 DIAGNOSIS — Z8249 Family history of ischemic heart disease and other diseases of the circulatory system: Secondary | ICD-10-CM | POA: Diagnosis not present

## 2019-05-06 DIAGNOSIS — E785 Hyperlipidemia, unspecified: Secondary | ICD-10-CM | POA: Diagnosis not present

## 2019-05-06 DIAGNOSIS — I447 Left bundle-branch block, unspecified: Secondary | ICD-10-CM | POA: Insufficient documentation

## 2019-05-06 MED ORDER — ATORVASTATIN CALCIUM 40 MG PO TABS: 40.0000 mg | ORAL_TABLET | Freq: Every day | ORAL | 3 refills | Status: DC

## 2019-05-06 MED ORDER — CARVEDILOL 3.125 MG PO TABS: 3.1250 mg | ORAL_TABLET | Freq: Two times a day (BID) | ORAL | 3 refills | Status: DC

## 2019-05-06 MED ORDER — POTASSIUM CHLORIDE CRYS ER 10 MEQ PO TBCR: EXTENDED_RELEASE_TABLET | ORAL | 1 refills | Status: DC

## 2019-05-06 MED ORDER — SPIRONOLACTONE 25 MG PO TABS: 25.0000 mg | ORAL_TABLET | Freq: Every day | ORAL | 3 refills | Status: DC

## 2019-05-06 MED ORDER — DIGOXIN 125 MCG PO TABS: 0.1250 mg | ORAL_TABLET | Freq: Every day | ORAL | 3 refills | Status: DC

## 2019-05-06 MED ORDER — FUROSEMIDE 20 MG PO TABS: 20.0000 mg | ORAL_TABLET | ORAL | 3 refills | Status: DC | PRN

## 2019-05-06 MED ORDER — ENTRESTO 49-51 MG PO TABS: 1.0000 | ORAL_TABLET | Freq: Two times a day (BID) | ORAL | 3 refills | Status: DC

## 2019-05-06 MED FILL — SPIRONOLACTONE 25 MG TABS: 25 | 90 days supply | Qty: 90 | Fill #0

## 2019-05-06 MED FILL — ENTRESTO 49 MG-51 MG TABLET: 49-51 | 90 days supply | Qty: 180 | Fill #0

## 2019-05-06 NOTE — Progress Notes (Signed)
PCP: Konrad Saha PA Primary HF Cardiologist: Dr Shirlee Latch   HPI:  Ann Bernard is a 73 year old with history of HTN, hyperlipidemia, tobacco abuse, and recently diagnosed systolic heart failure. Of note her brother has heart failure.   Admitted to [facility] with acute systolic heart failure. CTA negative for PE. ECHO was completed and showed severely reduced EF. Diagnostic cath was performed as noted below. Diuresed with IV lasix and transitioned to lasix 20 mg daily. Started on digoxin, carvedilol, and entresto. Discharge weight 174 pounds;   Today she returns for HF follow up. Last visit farxiga was added. Overall feeling fine. Denies SOB/PND/Orthopnea. No chest pain. Appetite ok. No fever or chills. Weight at home 165 pounds. No smoking. Taking all medications.  RHC/LHC  03/16/19  Right dominant coronary anatomy.  Normal left main.  Widely patent LAD with each of 2 diagonals having 50% ostial narrowing.  Widely patent circumflex.  Minimal luminal irregularities in the proximal mid and distal RCA.  Elevated left ventricular end-diastolic pressure, greater than 20 mmHg.  Mild pulmonary hypertension with mean pressure 33 mmHg. Pulmonary capillary wedge mean 23 mmHg. V wave 33 mmHg. CO 4.7/CI 2.34    Echo 03/16/19   Left ventricular ejection fraction, by visual estimation, is <20%. The left ventricle has severely decreased function. Moderately increased left ventricular size. There is no left ventricular hypertrophy. There is severe global hypokinesis.  CMRI 03/09/19  1.  Small pericardial effusion. 2. Moderately dilated LV with EF 13%, diffuse hypokinesis with septal-lateral dyssynchrony. 3. Normal RV size with moderately decreased systolic function, EF 30%. 4.  Mild to moderate MR, moderate AI. 5. Nonspecific RV insertion site LGE, this can be seen in the setting of LV pressure overload.  ROS: All systems negative except as listed in HPI, PMH and Problem List.  SH:    Social History   Socioeconomic History  . Marital status: Widowed    Spouse name: Not on file  . Number of children: Not on file  . Years of education: Not on file  . Highest education level: Not on file  Occupational History  . Not on file  Tobacco Use  . Smoking status: Current Some Day Smoker  . Smokeless tobacco: Never Used  Substance and Sexual Activity  . Alcohol use: No  . Drug use: Never  . Sexual activity: Not Currently  Other Topics Concern  . Not on file  Social History Narrative  . Not on file   Social Determinants of Health   Financial Resource Strain: Low Risk   . Difficulty of Paying Living Expenses: Not hard at all  Food Insecurity: No Food Insecurity  . Worried About Programme researcher, broadcasting/film/video in the Last Year: Never true  . Ran Out of Food in the Last Year: Never true  Transportation Needs: No Transportation Needs  . Lack of Transportation (Medical): No  . Lack of Transportation (Non-Medical): No  Physical Activity: Unknown  . Days of Exercise per Week: 0 days  . Minutes of Exercise per Session: Not on file  Stress: No Stress Concern Present  . Feeling of Stress : Not at all  Social Connections: Somewhat Isolated  . Frequency of Communication with Friends and Family: More than three times a week  . Frequency of Social Gatherings with Friends and Family: Once a week  . Attends Religious Services: More than 4 times per year  . Active Member of Clubs or Organizations: No  . Attends Banker Meetings: Never  .  Marital Status: Widowed  Intimate Partner Violence: Not At Risk  . Fear of Current or Ex-Partner: No  . Emotionally Abused: No  . Physically Abused: No  . Sexually Abused: No    FH:  Family History  Problem Relation Age of Onset  . Hypertension Mother   . Diabetes Mother   . Diabetes Brother   . Alcoholism Father   . Diabetes type I Grandson     Past Medical History:  Diagnosis Date  . Coronary artery disease   . Hypertension      Current Outpatient Medications  Medication Sig Dispense Refill  . atorvastatin (LIPITOR) 40 MG tablet Take 1 tablet (40 mg total) by mouth daily at 6 PM. 30 tablet 3  . bisacodyl (DULCOLAX) 5 MG EC tablet Take 5 mg by mouth daily as needed for moderate constipation.    . carvedilol (COREG) 3.125 MG tablet Take 1 tablet (3.125 mg total) by mouth 2 (two) times daily with a meal. 60 tablet 3  . digoxin (LANOXIN) 0.125 MG tablet Take 1 tablet (0.125 mg total) by mouth daily. 30 tablet 3  . diphenhydrAMINE (BENADRYL) 25 MG tablet Take 25 mg by mouth every 6 (six) hours as needed for itching.    . Multiple Vitamins-Minerals (PRESERVISION AREDS 2 PO) Take 1 capsule by mouth daily.    . sacubitril-valsartan (ENTRESTO) 24-26 MG Take 1 tablet by mouth 2 (two) times daily. 60 tablet 3  . spironolactone (ALDACTONE) 25 MG tablet Take 1 tablet (25 mg total) by mouth daily. 90 tablet 3  . furosemide (LASIX) 20 MG tablet Take 1 tablet (20 mg total) by mouth as needed for fluid. (Patient not taking: Reported on 05/06/2019) 30 tablet 3  . potassium chloride (KLOR-CON) 10 MEQ tablet TAKE 1 TABLET BY MOUTH WHEN YOU TAKE LASIX (Patient not taking: Reported on 05/06/2019) 90 tablet 1   No current facility-administered medications for this encounter.    Vitals:   05/06/19 1206  BP: 126/70  Pulse: 65  SpO2: 100%  Weight: 77.6 kg (171 lb)   Wt Readings from Last 3 Encounters:  05/06/19 77.6 kg (171 lb)  04/08/19 77.7 kg (171 lb 3.2 oz)  03/25/19 77.6 kg (171 lb)   PHYSICAL EXAM: General:  Well appearing. No resp difficulty HEENT: normal Neck: supple. no JVD. Carotids 2+ bilat; no bruits. No lymphadenopathy or thryomegaly appreciated. Cor: PMI nondisplaced. Regular rate & rhythm. No rubs, gallops or murmurs. Lungs: clear Abdomen: soft, nontender, nondistended. No hepatosplenomegaly. No bruits or masses. Good bowel sounds. Extremities: no cyanosis, clubbing, rash, edema Neuro: alert & orientedx3,  cranial nerves grossly intact. moves all 4 extremities w/o difficulty. Affect pleasant  ASSESSMENT & PLAN: 1. Chronic systolic CHF: Nonischemic cardiomyopathy. Echo with EF < 20%.  LHC/RHC showed nonobstructive coronary disease, CI 2.3.  Cardiac MRI showed EF 13%, moderate LV dilation, septal-lateral dyssynchrony c/w LBBB and diffuse severe hypokinesis, normal RV size with moderate systolic dysfunction EF 30%, mild-moderate MR, moderate AI, LGE at inferior RV insertion site (nonspecific). Symptoms only for a couple of weeks prior to admission. No prior viral-type syndrome. She does have a LBBB that is new compared to 2016, around 150 msec. 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  - Plan to repeat ECHO in February.  NYHA II. Change lasix to as needed.  -- Continue digoxin 0.125.  - Continue Coreg 3.125 mg bid. - Increase entresto 49-51 mg twice a day. Check BMET in 7 days.   - Continue  spironolactone to 25 mg daily.  -Continue farxiga 10 mg daily.  - If LV function does not improve with medical management, will likely be a CRT candidate with wide LBBB. 2. CAD: Nonobstructive CAD.  -No chest pain.  - She is on atorvastatin.  3. Former Heavy Smoker-Quit smoking over a month ago.     4. ?OSA: Outpatient sleep study. We discussed and she would like to hold off.    Follow up 4-6 weeks with Dr Haroldine Laws and ECHO. Greater than 50% of the (total minutes 25 ) visit spent in counseling/coordination of care regarding the above.   ]   Cameron Katayama NP-C   12:14 PM

## 2019-05-06 NOTE — Patient Instructions (Addendum)
All of your medications have been sent to the [facility] Delene Loll to 49/51 mg one tab twice a day  Labs needed in 10 days  Keep follow up as scheduled  Do the following things EVERYDAY: 1) Weigh yourself in the morning before breakfast. Write it down and keep it in a log. 2) Take your medicines as prescribed 3) Eat low salt foods--Limit salt (sodium) to 2000 mg per day.  4) Stay as active as you can everyday 5) Limit all fluids for the day to less than 2 liters  At the [facility], you and your health needs are our priority. As part of our continuing mission to provide you with exceptional heart care, we have created designated Provider Care Teams. These Care Teams include your primary Cardiologist (physician) and Advanced Practice Providers (APPs- Physician Assistants and Nurse Practitioners) who all work together to provide you with the care you need, when you need it.   You may see any of the following providers on your designated Care Team at your next follow up: Marland Kitchen Dr Glori Bickers . Dr Loralie Champagne . Darrick Grinder, NP . Lyda Jester, PA . Audry Riles, PharmD   Please be sure to bring in all your medications bottles to every appointment.

## 2019-05-17 ENCOUNTER — Ambulatory Visit (HOSPITAL_COMMUNITY)
Admission: RE | Admit: 2019-05-17 | Discharge: 2019-05-17 | Disposition: A | Discharge status: Home / Self Care | Payer: Medicare HMO | Source: Ambulatory Visit | Attending: Cardiology | Admitting: Cardiology

## 2019-05-17 ENCOUNTER — Other Ambulatory Visit: Payer: Self-pay

## 2019-05-17 DIAGNOSIS — I5022 Chronic systolic (congestive) heart failure: Secondary | ICD-10-CM | POA: Diagnosis not present

## 2019-05-17 LAB — BASIC METABOLIC PANEL
Anion gap: 9 (ref 5–15)
BUN: 13 mg/dL (ref 8–23)
CO2: 30 mmol/L (ref 22–32)
Calcium: 9.2 mg/dL (ref 8.9–10.3)
Chloride: 103 mmol/L (ref 98–111)
Creatinine, Ser: 0.92 mg/dL (ref 0.44–1.00)
GFR calc Af Amer: >60 mL/min (ref >60)
GFR calc non Af Amer: >60 mL/min (ref >60)
Glucose, Bld: 112 mg/dL — ABNORMAL HIGH (ref 70–99)
Potassium: 3.7 mmol/L (ref 3.5–5.1)
Sodium: 142 mmol/L (ref 135–145)

## 2019-06-25 ENCOUNTER — Ambulatory Visit (HOSPITAL_COMMUNITY)
Admission: RE | Admit: 2019-06-25 | Discharge: 2019-06-25 | Disposition: A | Discharge status: Home / Self Care | Payer: Medicare HMO | Source: Ambulatory Visit | Attending: Cardiology | Admitting: Cardiology

## 2019-06-25 ENCOUNTER — Other Ambulatory Visit: Payer: Self-pay

## 2019-06-25 ENCOUNTER — Encounter (HOSPITAL_COMMUNITY): Payer: Self-pay | Admitting: Cardiology

## 2019-06-25 ENCOUNTER — Ambulatory Visit (HOSPITAL_BASED_OUTPATIENT_CLINIC_OR_DEPARTMENT_OTHER)
Admission: RE | Admit: 2019-06-25 | Discharge: 2019-06-25 | Disposition: A | Discharge status: Home / Self Care | Payer: Medicare HMO | Source: Ambulatory Visit | Attending: Adult Health | Admitting: Adult Health

## 2019-06-25 VITALS — BP 114/68 | HR 80 | Wt 172.0 lb

## 2019-06-25 DIAGNOSIS — Z833 Family history of diabetes mellitus: Secondary | ICD-10-CM | POA: Diagnosis not present

## 2019-06-25 DIAGNOSIS — I5022 Chronic systolic (congestive) heart failure: Secondary | ICD-10-CM

## 2019-06-25 DIAGNOSIS — I351 Nonrheumatic aortic (valve) insufficiency: Secondary | ICD-10-CM | POA: Insufficient documentation

## 2019-06-25 DIAGNOSIS — F17200 Nicotine dependence, unspecified, uncomplicated: Secondary | ICD-10-CM | POA: Diagnosis not present

## 2019-06-25 DIAGNOSIS — I739 Peripheral vascular disease, unspecified: Secondary | ICD-10-CM | POA: Diagnosis not present

## 2019-06-25 DIAGNOSIS — I5021 Acute systolic (congestive) heart failure: Secondary | ICD-10-CM

## 2019-06-25 DIAGNOSIS — E785 Hyperlipidemia, unspecified: Secondary | ICD-10-CM | POA: Insufficient documentation

## 2019-06-25 DIAGNOSIS — I447 Left bundle-branch block, unspecified: Secondary | ICD-10-CM | POA: Insufficient documentation

## 2019-06-25 DIAGNOSIS — I110 Hypertensive heart disease with heart failure: Secondary | ICD-10-CM | POA: Insufficient documentation

## 2019-06-25 DIAGNOSIS — Z8249 Family history of ischemic heart disease and other diseases of the circulatory system: Secondary | ICD-10-CM | POA: Insufficient documentation

## 2019-06-25 DIAGNOSIS — I2510 Atherosclerotic heart disease of native coronary artery without angina pectoris: Secondary | ICD-10-CM | POA: Diagnosis not present

## 2019-06-25 DIAGNOSIS — I428 Other cardiomyopathies: Secondary | ICD-10-CM | POA: Insufficient documentation

## 2019-06-25 DIAGNOSIS — Z79899 Other long term (current) drug therapy: Secondary | ICD-10-CM | POA: Diagnosis not present

## 2019-06-25 DIAGNOSIS — Z7984 Long term (current) use of oral hypoglycemic drugs: Secondary | ICD-10-CM | POA: Insufficient documentation

## 2019-06-25 LAB — BASIC METABOLIC PANEL
Anion gap: 10 (ref 5–15)
BUN: 14 mg/dL (ref 8–23)
CO2: 25 mmol/L (ref 22–32)
Calcium: 9 mg/dL (ref 8.9–10.3)
Chloride: 107 mmol/L (ref 98–111)
Creatinine, Ser: 0.78 mg/dL (ref 0.44–1.00)
GFR calc Af Amer: >60 mL/min (ref >60)
GFR calc non Af Amer: >60 mL/min (ref >60)
Glucose, Bld: 106 mg/dL — ABNORMAL HIGH (ref 70–99)
Potassium: 3.8 mmol/L (ref 3.5–5.1)
Sodium: 142 mmol/L (ref 135–145)

## 2019-06-25 LAB — DIGOXIN LEVEL: Digoxin Level: 0.5 ng/mL — ABNORMAL LOW (ref 0.8–2.0)

## 2019-06-25 MED ORDER — CARVEDILOL 6.25 MG PO TABS: 6.2500 mg | ORAL_TABLET | Freq: Two times a day (BID) | ORAL | 3 refills | Status: DC

## 2019-06-25 MED ORDER — DAPAGLIFLOZIN PROPANEDIOL 10 MG PO TABS: 10.0000 mg | ORAL_TABLET | Freq: Every day | ORAL | 5 refills | Status: DC

## 2019-06-25 NOTE — Patient Instructions (Addendum)
INCREASE Coreg (carvedilol) to 6.25mg  1 tab twice a day   START Farxiga 10mg  1 tab daily (please check if you are already taking this medication)   Labs today We will only contact you if something comes back abnormal or we need to make some changes. Otherwise no news is good news!   You were ordered for an ultrasound of your legs.  You will get a call to schedule this appointment.    You have been referred to Electrophysiology. They will call you to schedule an appointment   Your physician recommends that you schedule a follow-up appointment in: 2 weeks with the Pharmacist to review your medications and 2 months with Dr   Please call office at [phone] option [phone] if you have any questions or concerns.    At the [facility], you and your health needs are our priority. As part of our continuing mission to provide you with exceptional heart care, we have created designated Provider Care Teams. These Care Teams include your primary Cardiologist (physician) and Advanced Practice Providers (APPs- Physician Assistants and Nurse Practitioners) who all work together to provide you with the care you need, when you need it.   You may see any of the following providers on your designated Care Team at your next follow up: [phone] Dr Marland Kitchen . Dr Arvilla Meres . Marca Ancona, NP . Tonye Becket, PA . Robbie Lis, PharmD   Please be sure to bring in all your medications bottles to every appointment.

## 2019-06-25 NOTE — Progress Notes (Signed)
  Echocardiogram 2D Echocardiogram has been performed.  Ann Bernard 06/25/2019, 11:40 AM

## 2019-06-27 NOTE — Progress Notes (Signed)
PCP: Drosinis, Pamalee Leyden, PA-C HF Cardiology: Dr. Aundra Dubin  Ann Bernard is a 74 y.o. with history of HTN, hyperlipidemia, tobacco abuse, and chronic systolic heart failure. Of note, her brother has heart failure and has an ICD.   Admitted to [facility] in 10/20 with acute systolic heart failure. CTA negative for PE. ECHO was completed and showed severely reduced EF. Diagnostic cath was performed as noted below, mild coronary disease not likely to have caused cardiomyopathy.  Diuresed with IV lasix and transitioned to lasix 20 mg daily. Started on digoxin, carvedilol, and Entresto. Discharge weight 174 pounds.      Echo was done today and reviewed, EF 20-25% with septal-lateral dyssynchrony.   She returns for followup of CHF.  Generally doing well, no significant exertional dyspnea.  No chest pain.  No orthopnea/PND.  Wilder Glade is not on her med list but she thinks she is taking it.  She has pain primarily in her right hip and calf, often with exertion. No lightheadedness or syncope, no palpitations.   ECG (personally reviewed): NSR, LBBB (QRS 154 msec)  Labs (2/21): digoxin 0.5, K 3.8, creatinine 0.78  PMH: 1. Chronic systolic CHF: Nonischemic cardiomyopathy.  - Echo (10/20): EF < 20%, moderately dilated LV, severe global HK.  - RHC/LHC (10/20): D1 and D2 with 50% ostial stenosis.  PCWP 23, CI 2.34.  - CMRI (10/20): Moderately dilated LV with EF 13%, diffuse hypokinesis, septal-lateral dyssynchrony, normal RV size with EF 30%, nonspecific RV insertion site LGE.   - Echo (2/21): EF 20-25%, septal-lateral dyssynchrony, mild AI, mild-moderately decreased RV systolic function.  2. Active smoker.  3. Hyperlipidemia 4. HTN  ROS: All systems negative except as listed in HPI, PMH and Problem List.  SH:  Social History   Socioeconomic History  . Marital status: Widowed    Spouse name: Not on file  . Number of children: Not on file  . Years of education: Not on file  . Highest education level:  Not on file  Occupational History  . Not on file  Tobacco Use  . Smoking status: Current Some Day Smoker  . Smokeless tobacco: Never Used  Substance and Sexual Activity  . Alcohol use: No  . Drug use: Never  . Sexual activity: Not Currently  Other Topics Concern  . Not on file  Social History Narrative  . Not on file   Social Determinants of Health   Financial Resource Strain: Low Risk   . Difficulty of Paying Living Expenses: Not hard at all  Food Insecurity: No Food Insecurity  . Worried About Charity fundraiser in the Last Year: Never true  . Ran Out of Food in the Last Year: Never true  Transportation Needs: No Transportation Needs  . Lack of Transportation (Medical): No  . Lack of Transportation (Non-Medical): No  Physical Activity: Unknown  . Days of Exercise per Week: 0 days  . Minutes of Exercise per Session: Not on file  Stress: No Stress Concern Present  . Feeling of Stress : Not at all  Social Connections: Somewhat Isolated  . Frequency of Communication with Friends and Family: More than three times a week  . Frequency of Social Gatherings with Friends and Family: Once a week  . Attends Religious Services: More than 4 times per year  . Active Member of Clubs or Organizations: No  . Attends Archivist Meetings: Never  . Marital Status: Widowed  Intimate Partner Violence: Not At Risk  . Fear of Current  or Ex-Partner: No  . Emotionally Abused: No  . Physically Abused: No  . Sexually Abused: No    FH:  Family History  Problem Relation Age of Onset  . Hypertension Mother   . Diabetes Mother   . Diabetes Brother   . Alcoholism Father   . Diabetes type I Grandson     Current Outpatient Medications  Medication Sig Dispense Refill  . atorvastatin (LIPITOR) 40 MG tablet Take 1 tablet (40 mg total) by mouth daily at 6 PM. 90 tablet 3  . bisacodyl (DULCOLAX) 5 MG EC tablet Take 5 mg by mouth daily as needed for moderate constipation.    . carvedilol  (COREG) 6.25 MG tablet Take 1 tablet (6.25 mg total) by mouth 2 (two) times daily with a meal. 60 tablet 3  . digoxin (LANOXIN) 0.125 MG tablet Take 1 tablet (0.125 mg total) by mouth daily. 90 tablet 3  . diphenhydrAMINE (BENADRYL) 25 MG tablet Take 25 mg by mouth every 6 (six) hours as needed for itching.    . furosemide (LASIX) 20 MG tablet Take 1 tablet (20 mg total) by mouth as needed for fluid. 90 tablet 3  . Multiple Vitamins-Minerals (PRESERVISION AREDS 2 PO) Take 1 capsule by mouth daily.    . potassium chloride (KLOR-CON) 10 MEQ tablet TAKE 1 TABLET BY MOUTH WHEN YOU TAKE LASIX 90 tablet 1  . sacubitril-valsartan (ENTRESTO) 49-51 MG Take 1 tablet by mouth 2 (two) times daily. 180 tablet 3  . spironolactone (ALDACTONE) 25 MG tablet Take 1 tablet (25 mg total) by mouth daily. 90 tablet 3  . dapagliflozin propanediol (FARXIGA) 10 MG TABS tablet Take 10 mg by mouth daily before breakfast. 30 tablet 5   No current facility-administered medications for this encounter.    Vitals:   06/25/19 1144  BP: 114/68  Pulse: 80  SpO2: 100%  Weight: 78 kg (172 lb)   Wt Readings from Last 3 Encounters:  06/25/19 78 kg (172 lb)  05/06/19 77.6 kg (171 lb)  04/08/19 77.7 kg (171 lb 3.2 oz)   PHYSICAL EXAM: General: NAD Neck: No JVD, no thyromegaly or thyroid nodule.  Lungs: Clear to auscultation bilaterally with normal respiratory effort. CV: Nondisplaced PMI.  Heart regular S1/S2, no S3/S4, no murmur.  No peripheral edema.  No carotid bruit. Unable to palpate pedal pulses.  Abdomen: Soft, nontender, no hepatosplenomegaly, no distention.  Skin: Intact without lesions or rashes.  Neurologic: Alert and oriented x 3.  Psych: Normal affect. Extremities: No clubbing or cyanosis.  HEENT: Normal.   ASSESSMENT & PLAN: 1. Chronic systolic CHF: Nonischemic cardiomyopathy. Echo 10/20 with EF < 20%.  LHC/RHC 10/20 showed nonobstructive coronary disease, CI 2.3.  Cardiac MRI (10/20) showed EF 13%,  moderate LV dilation, septal-lateral dyssynchrony c/w LBBB and diffuse severe hypokinesis, normal RV size with moderate systolic dysfunction EF 30%, mild-moderate MR, moderate AI, LGE at inferior RV insertion site (nonspecific). Echo was repeated today and reviewed, EF remains 20-25%. She has a wide LBBB.?Familial cardiomyopathy, brother with ICD but do not know why.  On exam, she is not volume overloaded.  NYHA class II symptoms.  - I will refer her to EP for CRT-D.  - Continue digoxin 0.125, check level today.  - Increase Coreg to 6.25 mg bid.  - Continue Entresto 49-51 mg twice a day. BMET today.    - Continue spironolactone to 25 mg daily.  - She thinks she is on [place] but it is not on med list.  I will give her a prescription for Farxiga 10 mg daily, she should start it if she is not taking it.  2. CAD: Nonobstructive CAD.  - Continue atorvastatin, lipids next appt.   3. Smoking: She has now quit.      4. ?PAD: Patient has symptoms in right leg suggestive of claudication.   - Will arrange for peripheral arterial doppler study.    Followup 3 wks with HF pharmacist for med titration, see me in 2 months.   Marca Ancona 06/27/2019

## 2019-06-29 ENCOUNTER — Other Ambulatory Visit (HOSPITAL_COMMUNITY): Payer: Self-pay | Admitting: Cardiology

## 2019-06-29 DIAGNOSIS — I739 Peripheral vascular disease, unspecified: Secondary | ICD-10-CM

## 2019-07-05 ENCOUNTER — Other Ambulatory Visit: Payer: Self-pay

## 2019-07-05 ENCOUNTER — Ambulatory Visit (HOSPITAL_COMMUNITY)
Admission: RE | Admit: 2019-07-05 | Discharge: 2019-07-05 | Disposition: A | Discharge status: Home / Self Care | Payer: Medicare HMO | Source: Ambulatory Visit | Attending: Cardiovascular Disease | Admitting: Cardiovascular Disease

## 2019-07-05 DIAGNOSIS — I739 Peripheral vascular disease, unspecified: Secondary | ICD-10-CM

## 2019-07-08 NOTE — Progress Notes (Signed)
PCP: Drosinis, Pamalee Leyden, PA-C HF Cardiology: Dr. Aundra Dubin  HPI:  Ann Bernard is a 74 y.o. with history of HTN, hyperlipidemia, tobacco abuse, and chronic systolic heart failure.Of note, her brother has heart failure and has an ICD.   Admitted to [facility] in 10/20 with acute systolic heart failure. CTA negative for PE. ECHO was completed and showed severely reduced EF. Diagnostic cath was performed as noted below, mild coronary disease not likely to have caused cardiomyopathy.  Diuresed with IV lasix and transitioned to lasix 20 mg daily. Started on digoxin, carvedilol, and Entresto. Discharge weight 174 pounds.      Echo was done 06/25/19 and reviewed, EF 20-25% with septal-lateral dyssynchrony.   Recently presented to HF Clinic for follow up with Dr. Aundra Dubin on 06/25/19.  Generally doing well, no significant exertional dyspnea.  No chest pain.  No orthopnea/PND.  Ann Bernard was not on her med list but she thinks she is taking it.  She had pain primarily in her right hip and calf, often with exertion. No lightheadedness or syncope, no palpitations.   Today she returns to HF clinic for pharmacist medication titration. At last visit with MD, carvedilol was increased to 6.25 mg BID. Overall she is feeling well today. No dizziness, lightheadedness, chest pain or palpations. Her breathing is fine and she can walk for 15 minutes before becoming SOB. She does weigh herself daily at home. She states her weight has been stable at 168 lbs at home. Her weight is down 7 lbs in clinic today. No LEE, PND or orthopnea. Her appetite is good and she follows a low salt diet.  She expressed concerns that she was taking her medications incorrectly - stated that she knew some medications had been discontinued, but was not sure which ones, so she didn't make any changes. Additionally, she fills at multiple pharmacies, which has caused confusion on which medications and which medication strengths are correct. She brought all  her medications in with her today. After review, there were significant errors and points of concern. She was taking both strengths of Entresto at the same time, 24/26 AND 49/51. She did not know she was not supposed to take them both when the [facility] was increased in December. Additionally, she was still taking olmesartan 20 mg daily. I have disposed of the olmesartan and Entresto 24/26. She was also taking furosemide and potassium daily instead of PRN. I helped her fill pill box in clinic and helped her get her prescriptions transferred from [facility] to [facility].   HF Medications: Carvedilol 6.25 mg BID Entresto 49/51 mg BID Spironolactone 25 mg daily Dapagliflozin 10 mg daily Digoxin 0.125 mg daily Furosemide 20 mg PRN  Has the patient been experiencing any side effects to the medications prescribed?  no  Does the patient have any problems obtaining medications due to transportation or finances?   No - has Medicare with low income subsidy  Understanding of regimen: poor Understanding of indications: poor Potential of compliance: very compliant but has poor health literacy, contributing to mistakes in medication administration Patient understands to avoid NSAIDs. Patient understands to avoid decongestants.    Pertinent Lab Values (06/25/19): Marland Kitchen Serum creatinine 0.78, BUN 14, Potassium 3.8, Sodium 142,  Digoxin 0.5 ng/mL   Vital Signs: . Weight: 165.2 lbs (last clinic weight: 172 lbs) . Blood pressure: 118/70  . Heart rate: 83   Assessment: 1. Chronic systolic CHF: Nonischemic cardiomyopathy. Echo 10/20 with EF <20%. LHC/RHC 10/20 showed nonobstructive coronary disease,  CI 2.3. Cardiac MRI (10/20) showed EF 13%, moderate LV dilation, septal-lateral dyssynchrony c/w LBBB and diffuse severe hypokinesis, normal RV size with moderate systolic dysfunction EF 30%, mild-moderate MR, moderate AI, LGE at inferior RV insertion site (nonspecific). Echo was repeated  today and reviewed, EF remains 20-25%. She has a wide LBBB.?Familial cardiomyopathy, brother with ICD but do not know why.  -On exam, she is not volume overloaded.  NYHA class II symptoms.  - Had been taking furosemide 20 mg daily instead of PRN. I will have her start taking furosemide 20 mg PRN only. We discussed how to use furosemide PRN, taking 1 tablet when she gains 3 lbs overnight or 5 lbs in 1 week.   - Continue carvedilol 6.25 mg BID.  -As above, she was taking Entresto 24/26 mg, 49/51 mg and olmesartan 20 mg. Instructed her to continue Entresto 49-51 mg twice a day and stop all other strengths and stop olmesartan. I have removed all discontinued medications from her pill box and will dispose of the bottles.  - Continue spironolactone 25 mg daily.  - Continue dapagliflozin 10 mg daily     - Continue digoxin 0.125 mg daily. Level 0.5 ng/mL on 06/25/19 -Referred to EP for CRT-D 2. CAD: Nonobstructive CAD.  - Continue atorvastatin, lipids next appt.   3. Smoking: She has now quit.        Plan: 1) Medication changes: Based on clinical presentation, vital signs and recent labs will make no medication changes today given volume of mediation errors. Will have her continue Entresto 49/51 mg BID and STOP the 24/26 mg strength in addition to stopping olmesartan. Will also have her start taking furosemide only as needed. Will transfer all her medications to [facility]. 3) Follow-up: 3 weeks in pharmacy clinic to review pill box, do BP check, and hopefully increase Entresto.    Karle Plumber, PharmD, BCPS, BCCP, CPP [facility] Clinic Pharmacist [phone]

## 2019-07-14 ENCOUNTER — Other Ambulatory Visit (HOSPITAL_COMMUNITY): Payer: Self-pay | Admitting: Cardiology

## 2019-07-14 ENCOUNTER — Ambulatory Visit (HOSPITAL_COMMUNITY)
Admission: RE | Admit: 2019-07-14 | Discharge: 2019-07-14 | Disposition: A | Discharge status: Home / Self Care | Payer: Medicare HMO | Source: Ambulatory Visit | Attending: Internal Medicine | Admitting: Internal Medicine

## 2019-07-14 ENCOUNTER — Other Ambulatory Visit: Payer: Self-pay

## 2019-07-14 VITALS — BP 118/70 | HR 83 | Wt 165.2 lb

## 2019-07-14 DIAGNOSIS — I2510 Atherosclerotic heart disease of native coronary artery without angina pectoris: Secondary | ICD-10-CM | POA: Diagnosis not present

## 2019-07-14 DIAGNOSIS — Z8249 Family history of ischemic heart disease and other diseases of the circulatory system: Secondary | ICD-10-CM | POA: Diagnosis not present

## 2019-07-14 DIAGNOSIS — Z79899 Other long term (current) drug therapy: Secondary | ICD-10-CM | POA: Diagnosis not present

## 2019-07-14 DIAGNOSIS — E785 Hyperlipidemia, unspecified: Secondary | ICD-10-CM | POA: Diagnosis not present

## 2019-07-14 DIAGNOSIS — Z87891 Personal history of nicotine dependence: Secondary | ICD-10-CM | POA: Insufficient documentation

## 2019-07-14 DIAGNOSIS — I110 Hypertensive heart disease with heart failure: Secondary | ICD-10-CM | POA: Diagnosis not present

## 2019-07-14 DIAGNOSIS — I428 Other cardiomyopathies: Secondary | ICD-10-CM | POA: Diagnosis not present

## 2019-07-14 DIAGNOSIS — I5022 Chronic systolic (congestive) heart failure: Secondary | ICD-10-CM | POA: Diagnosis present

## 2019-07-14 MED ORDER — ATORVASTATIN CALCIUM 40 MG PO TABS: 40.0000 mg | ORAL_TABLET | Freq: Every day | ORAL | 3 refills | Status: DC

## 2019-07-14 MED ORDER — DAPAGLIFLOZIN PROPANEDIOL 10 MG PO TABS: 10.0000 mg | ORAL_TABLET | Freq: Every day | ORAL | 5 refills | Status: DC

## 2019-07-14 MED ORDER — CARVEDILOL 6.25 MG PO TABS: 6.2500 mg | ORAL_TABLET | Freq: Two times a day (BID) | ORAL | 11 refills | Status: DC

## 2019-07-14 MED FILL — ATORVASTATIN 40 MG TABLET: 40 | 90 days supply | Qty: 90 | Fill #0

## 2019-07-14 NOTE — Patient Instructions (Signed)
It was a pleasure seeing you today!  MEDICATIONS: -Start taking furosemide (Lasix) and potassium ONLY AS NEEDED for weight gain of 3 lbs in 1 day or 5 lbs in 1 week -Stop taking olemsartan -Call if you have questions about your medications.   NEXT APPOINTMENT: Return to clinic in 3 weeks with Pharmacy Clinic.  In general, to take care of your heart failure: -Limit your fluid intake to 2 Liters (half-gallon) per day.   -Limit your salt intake to ideally 2-3 grams (2000-3000 mg) per day. -Weigh yourself daily and record, and bring that "weight diary" to your next appointment.  (Weight gain of 2-3 pounds in 1 day typically means fluid weight.) -The medications for your heart are to help your heart and help you live longer.   -Please contact us before stopping any of your heart medications.  Call the clinic at [phone] with questions or to reschedule future appointments.

## 2019-07-19 ENCOUNTER — Ambulatory Visit: Payer: Medicare HMO | Admitting: Cardiology

## 2019-07-19 ENCOUNTER — Other Ambulatory Visit: Payer: Self-pay

## 2019-07-19 ENCOUNTER — Encounter: Payer: Self-pay | Admitting: Cardiology

## 2019-07-19 VITALS — BP 130/64 | HR 74 | Ht 67.0 in | Wt 169.0 lb

## 2019-07-19 DIAGNOSIS — I428 Other cardiomyopathies: Secondary | ICD-10-CM

## 2019-07-19 MED FILL — FARXIGA 10 MG TABLET: 10 | 30 days supply | Qty: 30 | Fill #0

## 2019-07-19 MED FILL — FUROSEMIDE 20 MG TABS: 20 | 30 days supply | Qty: 30 | Fill #0

## 2019-07-19 NOTE — Progress Notes (Signed)
Electrophysiology Office Note   Date:  07/19/2019   ID:  Ann Bernard, DOB 1945-10-27, MRN [id number]  PCP:  Drosinis, Leonia Reader, PA-C  Cardiologist:  Shirlee Latch Primary Electrophysiologist:  Kerensa Nicklas Jorja Loa, MD    Chief Complaint: CHF   History of Present Illness: Ann Bernard is a 73 y.o. female who is being seen today for the evaluation of CHF at the request of Laurey Morale, MD. Presenting today for electrophysiology evaluation.  She has a history significant for hypertension, hyperlipidemia, tobacco abuse, and chronic systolic heart failure.  She presented to [facility] October 2020 with acute systolic heart failure.  Diagnostic cath was performed that showed mild coronary artery disease.  She was started on optimal medical therapy.  Repeat echo shows persistently low ejection fraction.  Today, she denies symptoms of palpitations, chest pain, shortness of breath, orthopnea, PND, lower extremity edema, claudication, dizziness, presyncope, syncope, bleeding, or neurologic sequela. The patient is tolerating medications without difficulties.  She has felt much better since starting her heart failure medications.  She has not had much in the way of shortness of breath, but she does feel somewhat fatigued.   Past Medical History:  Diagnosis Date  . Coronary artery disease   . Hypertension    Past Surgical History:  Procedure Laterality Date  . RIGHT/LEFT HEART CATH AND CORONARY ANGIOGRAPHY N/A 03/16/2019   Procedure: RIGHT/LEFT HEART CATH AND CORONARY ANGIOGRAPHY;  Surgeon: Lyn Records, MD;  Location: [facility] INVASIVE CV LAB;  Service: Cardiovascular;  Laterality: N/A;     Current Outpatient Medications  Medication Sig Dispense Refill  . alendronate (FOSAMAX) 70 MG tablet Take 70 mg by mouth once a week. Every Monday    . atorvastatin (LIPITOR) 40 MG tablet Take 1 tablet (40 mg total) by mouth daily at 6 PM. 90 tablet 3  . bisacodyl (DULCOLAX) 5 MG EC tablet Take 5 mg  by mouth daily as needed for moderate constipation.    . carvedilol (COREG) 6.25 MG tablet Take 1 tablet (6.25 mg total) by mouth 2 (two) times daily with a meal. 60 tablet 11  . dapagliflozin propanediol (FARXIGA) 10 MG TABS tablet Take 10 mg by mouth daily before breakfast. 30 tablet 5  . digoxin (LANOXIN) 0.125 MG tablet Take 1 tablet (0.125 mg total) by mouth daily. 90 tablet 3  . diphenhydrAMINE (BENADRYL) 25 MG tablet Take 25 mg by mouth every 6 (six) hours as needed for itching.    . diphenhydrAMINE (SOMINEX) 25 MG tablet Take 1 tablet by mouth as needed for itching.    . furosemide (LASIX) 20 MG tablet Take 1 tablet (20 mg total) by mouth as needed for fluid. 90 tablet 3  . Multiple Vitamins-Minerals (PRESERVISION AREDS 2 PO) Take 1 capsule by mouth daily.    . potassium chloride (KLOR-CON) 10 MEQ tablet TAKE 1 TABLET BY MOUTH WHEN YOU TAKE LASIX 90 tablet 1  . sacubitril-valsartan (ENTRESTO) 49-51 MG Take 1 tablet by mouth 2 (two) times daily. 180 tablet 3  . spironolactone (ALDACTONE) 25 MG tablet Take 1 tablet (25 mg total) by mouth daily. 90 tablet 3   No current facility-administered medications for this visit.    Allergies:   Aspirin   Social History:  The patient  reports that she has been smoking. She has never used smokeless tobacco. She reports that she does not drink alcohol or use drugs.   Family History:  The patient's family history includes Alcoholism in her father; Diabetes in  her brother and mother; Diabetes type I in her grandson; Hypertension in her mother.    ROS:  Please see the history of present illness.   Otherwise, review of systems is positive for none.   All other systems are reviewed and negative.    PHYSICAL EXAM: VS:  BP 130/64   Pulse 74   Ht 5\' 7"  (1.702 m)   Wt 169 lb (76.7 kg)   SpO2 99%   BMI 26.47 kg/m  , BMI Body mass index is 26.47 kg/m. GEN: Well nourished, well developed, in no acute distress  HEENT: normal  Neck: no JVD, carotid  bruits, or masses Cardiac: RRR; no murmurs, rubs, or gallops,no edema  Respiratory:  clear to auscultation bilaterally, normal work of breathing GI: soft, nontender, nondistended, + BS MS: no deformity or atrophy  Skin: warm and dry Neuro:  Strength and sensation are intact Psych: euthymic mood, full affect  EKG:  EKG is not ordered today. Personal review of the ekg ordered 06/25/19 shows sinus rhythm, left bundle branch block  Recent Labs: 03/14/2019: ALT 38; B Natriuretic Peptide 1,035.7 03/17/2019: Hemoglobin 14.3; Platelets 150; TSH 2.833 06/25/2019: BUN 14; Creatinine, Ser 0.78; Potassium 3.8; Sodium 142    Lipid Panel     Component Value Date/Time   CHOL 194 03/16/2019 0251   TRIG 65 03/16/2019 0251   HDL 53 03/16/2019 0251   CHOLHDL 3.7 03/16/2019 0251   VLDL 13 03/16/2019 0251   LDLCALC 128 (H) 03/16/2019 0251   LDLDIRECT 138.3 01/25/2009 1524     Wt Readings from Last 3 Encounters:  07/19/19 169 lb (76.7 kg)  07/14/19 165 lb 3.2 oz (74.9 kg)  06/25/19 172 lb (78 kg)      Other studies Reviewed: Additional studies/ records that were reviewed today include: TTE 06/25/19  Review of the above records today demonstrates:  1. Left ventricular ejection fraction, by visual estimation, is 25 to  30%. The left ventricle has normal function. There is no left ventricular  hypertrophy.  2. Abnormal septal motion consistent with left bundle branch block.  3. Left ventricular diastolic parameters are consistent with Grade I  diastolic dysfunction (impaired relaxation).  4. The left ventricle demonstrates global hypokinesis.  5. Global right ventricle has normal systolic function.The right  ventricular size is normal.  6. Left atrial size was normal.  7. Right atrial size was normal.  8. The mitral valve is normal in structure. Trivial mitral valve  regurgitation. No evidence of mitral stenosis.  9. The tricuspid valve is normal in structure. Tricuspid valve    regurgitation is trivial.  10. The aortic valve is tricuspid. Aortic valve regurgitation is mild.  Mild aortic valve sclerosis without stenosis.  11. The pulmonic valve was not well visualized. Pulmonic valve  regurgitation is not visualized.  12. The inferior vena cava is normal in size with greater than 50%  respiratory variability, suggesting right atrial pressure of 3 mmHg.  13. Severe global reduction in LV systolic function; grade 1 diastolic  dysfunction; mild AI.   RHC/LHC 03/16/19  Right dominant coronary anatomy.  Normal left main.  Widely patent LAD with each of 2 diagonals having 50% ostial narrowing.  Widely patent circumflex.  Minimal luminal irregularities in the proximal mid and distal RCA.  Elevated left ventricular end-diastolic pressure, greater than 20 mmHg.  Mild pulmonary hypertension with mean pressure 33 mmHg.  Pulmonary capillary wedge mean 23 mmHg.  V wave 33 mmHg.   ASSESSMENT AND PLAN:  1.  Chronic systolic heart failure due to nonischemic cardiomyopathy: Currently on optimal medical therapy with carvedilol, Entresto, Aldactone.  Ejection fraction remains low despite optimal medical therapy.  We Onalee Steinbach thus plan for ICD implant.  Risks and benefits were discussed which include bleeding, tamponade, infection, pneumothorax.  She understands these risks and has agreed to the procedure.  2.  Nonobstructive coronary artery disease: No current chest pain.  Continue atorvastatin.  3.  Tobacco abuse: Fortunately she has quit smoking.  Case discussed with primary cardiology  Current medicines are reviewed at length with the patient today.   The patient does not have concerns regarding her medicines.  The following changes were made today:  none  Labs/ tests ordered today include:  No orders of the defined types were placed in this encounter.    Disposition:   FU with Lavergne Hiltunen 3 months  Signed, Jacia Sickman Meredith Leeds, MD  07/19/2019 10:01 AM      [facility] [address] [place] [address] [phone] (office) [phone] (fax)

## 2019-07-19 NOTE — Patient Instructions (Addendum)
Medication Instructions:  Your physician recommends that you continue on your current medications as directed. Please refer to the Current Medication list given to you today.  *If you need a refill on your cardiac medications before your next appointment, please call your pharmacy*   Lab Work: None ordered If you have labs (blood work) drawn today and your tests are completely normal, you will receive your results only by: Marland Kitchen MyChart Message (if you have MyChart) OR . A paper copy in the mail If you have any lab test that is abnormal or we need to change your treatment, we will call you to review the results.   Testing/Procedures: CRT or cardiac resynchronization therapy is a treatment used to correct heart failure. When you have heart failure your heart is weakened and doesn't pump as well as it should. This therapy may help reduce symptoms and improve the quality of life.  Please see the procedure instructions below.   Follow-Up: At [facility], you and your health needs are our priority.  As part of our continuing mission to provide you with exceptional heart care, we have created designated Provider Care Teams.  These Care Teams include your primary Cardiologist (physician) and Advanced Practice Providers (APPs -  Physician Assistants and Nurse Practitioners) who all work together to provide you with the care you need, when you need it.  We recommend signing up for the patient portal called "MyChart".  Sign up information is provided on this After Visit Summary.  MyChart is used to connect with patients for Virtual Visits (Telemedicine).  Patients are able to view lab/test results, encounter notes, upcoming appointments, etc.  Non-urgent messages can be sent to your provider as well.   To learn more about what you can do with MyChart, go to [email or web address].    Your next appointment:   4 month(s)  The format for your next appointment:   In Person  Provider:   Allegra Lai,  MD   Other Instructions   Implantable Device Instructions  You are scheduled for: BiVentricular Implantable cardiac defibrillator on 08/27/19 with Dr. Curt Bears.  1.   Pre procedure testing-             COVID TEST-- On 08/24/19 @ 11:15 am - You will go to [facility] hospital ([place]) for your Covid testing.   This is a drive thru test site.  There will be multiple testing areas.  Be sure to share with the first checkpoint that you are there for pre-procedure/surgery testing. This will put you into the right (yellow) lane that leads to the PAT testing team.   Stay in your car and the nurse team will come to your car to test you.  After you are tested please go home and self quarantine until the day of your procedure.    2. On the day of your procedure 08/27/19 you will go to [facility] hospital ([address]) at 9:30 am.  Dennis Bast will go to the main entrance [address] [employer]) and enter where the [employer] are.  You will check in at ADMITTING.  You may have one support person come in to the hospital with you.  They will be asked to wait in the waiting room.   3.   Do not eat or drink after midnight prior to your procedure.   4.   On the morning of your procedure do NOT take any medication.  5.  The night before your procedure  and the morning of your procedure scrub your neck/chest with surgical scrub.  An instruction letter is included with this letter.    5.  Plan for an overnight stay.  If you use your phone frequently bring your phone charger.  When you are discharged you will need someone to drive you home.   6.  You will follow up with the [facility] clinic 10-14 days after your procedure. You will follow up with Dr. Elberta Fortis 91 days after your procedure.  These appointments will be made for you.   * If you have ANY questions after you get home, please call the office [phone] and ask for Noralyn Karim RN or send a MyChart message.   [facility] -  Preparing For Surgery  Before surgery, you can play an important role. Because skin is not sterile, your skin needs to be as free of germs as possible. You can reduce the number of germs on your skin by washing with CHG (chlorahexidine gluconate) Soap before surgery.  CHG is an antiseptic cleaner which kills germs and bonds with the skin to continue killing germs even after washing.   Please do not use if you have an allergy to CHG or antibacterial soaps.  If your skin becomes reddened/irritated stop using the CHG.   Do not shave (including legs and underarms) for at least 48 hours prior to first CHG shower.  It is OK to shave your face.  Please follow these instructions carefully:  1.  Shower the night before surgery and the morning of surgery with CHG.  2.  If you choose to wash your hair, wash your hair first as usual with your normal shampoo.  3.  After you shampoo, rinse your hair and body thoroughly to remove the shampoo.  4.  Use CHG as you would any other liquid soap.  You can apply CHG directly to the skin and wash gently with a clean washcloth. 5.  Apply the CHG Soap to your body ONLY FROM THE NECK DOWN.  Do not use on open wounds or open sores.  Avoid contact with your eyes, ears, mouth and genitals (private parts).  Wash genitals (private parts) with your normal soap.  6.  Wash thoroughly, paying special attention to the area where your surgery will be performed.  7.  Thoroughly rinse your body with warm water from the neck down.   8.  DO NOT shower/wash with your normal soap after using and rinsing off the CHG soap.  9.  Pat yourself dry with a clean towel.           10.  Wear clean pajamas.           11.  Place clean sheets on your bed the night of your first shower and do not sleep with pets.  Day of Surgery: Do not apply any deodorants/lotions.  Please wear clean clothes to the hospital/surgery center.     Cardioverter Defibrillator Implantation  An implantable  cardioverter defibrillator (ICD) is a small device that is placed under the skin in the chest or abdomen. An ICD consists of a battery, a small computer (pulse generator), and wires (leads) that go into the heart. An ICD is used to detect and correct two types of dangerous irregular heartbeats (arrhythmias):  A rapid heart rhythm (tachycardia).  An arrhythmia in which the lower chambers of the heart (ventricles) contract in an uncoordinated way (fibrillation). When an ICD detects tachycardia, it sends a low-energy shock to  the heart to restore the heartbeat to normal (cardioversion). This signal is usually painless. If cardioversion does not work or if the ICD detects fibrillation, it delivers a high-energy shock to the heart (defibrillation) to restart the heart. This shock may feel like a strong jolt in the chest. Your health care provider may prescribe an ICD if:  You have had an arrhythmia that originated in the ventricles.  Your heart has been damaged by a disease or heart condition. Sometimes, ICDs are programmed to act as a device called a pacemaker. Pacemakers can be used to treat a slow heartbeat (bradycardia) or tachycardia by taking over the heart rate with electrical impulses. Tell a health care provider about:  Any allergies you have.  All medicines you are taking, including vitamins, herbs, eye drops, creams, and over-the-counter medicines.  Any problems you or family members have had with anesthetic medicines.  Any blood disorders you have.  Any surgeries you have had.  Any medical conditions you have.  Whether you are pregnant or may be pregnant. What are the risks? Generally, this is a safe procedure. However, problems may occur, including:  Swelling, bleeding, or bruising.  Infection.  Blood clots.  Damage to other structures or organs, such as nerves, blood vessels, or the heart.  Allergic reactions to medicines used during the procedure. What happens before  the procedure? Staying hydrated Follow instructions from your health care provider about hydration, which may include:  Up to 2 hours before the procedure - you may continue to drink clear liquids, such as water, clear fruit juice, black coffee, and plain tea. Eating and drinking restrictions Follow instructions from your health care provider about eating and drinking, which may include:  8 hours before the procedure - stop eating heavy meals or foods such as meat, fried foods, or fatty foods.  6 hours before the procedure - stop eating light meals or foods, such as toast or cereal.  6 hours before the procedure - stop drinking milk or drinks that contain milk.  2 hours before the procedure - stop drinking clear liquids. Medicine Ask your health care provider about:  Changing or stopping your normal medicines. This is important if you take diabetes medicines or blood thinners.  Taking medicines such as aspirin and ibuprofen. These medicines can thin your blood. Do not take these medicines before your procedure if your doctor tells you not to. Tests  You may have blood tests.  You may have a test to check the electrical signals in your heart (electrocardiogram, ECG).  You may have imaging tests, such as a chest X-ray. General instructions  For 24 hours before the procedure, stop using products that contain nicotine or tobacco, such as cigarettes and e-cigarettes. If you need help quitting, ask your health care provider.  Plan to have someone take you home from the hospital or clinic.  You may be asked to shower with a germ-killing soap. What happens during the procedure?  To reduce your risk of infection: ? Your health care team will wash or sanitize their hands. ? Your skin will be washed with soap. ? Hair may be removed from the surgical area.  Small monitors will be put on your body. They will be used to check your heart, blood pressure, and oxygen level.  An IV tube  will be inserted into one of your veins.  You will be given one or more of the following: ? A medicine to help you relax (sedative). ? A medicine to  numb the area (local anesthetic). ? A medicine to make you fall asleep (general anesthetic).  Leads will be guided through a blood vessel into your heart and attached to your heart muscles. Depending on the ICD, the leads may go into one ventricle or they may go into both ventricles and into an upper chamber of the heart. An X-ray machine (fluoroscope) will be usedto help guide the leads.  A small incision will be made to create a deep pocket under your skin.  The pulse generator will be placed into the pocket.  The ICD will be tested.  The incision will be closed with stitches (sutures), skin glue, or staples.  A bandage (dressing) will be placed over the incision. This procedure may vary among health care providers and hospitals. What happens after the procedure?  Your blood pressure, heart rate, breathing rate, and blood oxygen level will be monitored often until the medicines you were given have worn off.  A chest X-ray will be taken to check that the ICD is in the right place.  You will need to stay in the hospital for 1-2 days so your health care provider can make sure your ICD is working.  Do not drive for 24 hours if you received a sedative. Ask your health care provider when it is safe for you to drive.  You may be given an identification card explaining that you have an ICD. Summary  An implantable cardioverter defibrillator (ICD) is a small device that is placed under the skin in the chest or abdomen. It is used to detect and correct dangerous irregular heartbeats (arrhythmias).  An ICD consists of a battery, a small computer (pulse generator), and wires (leads) that go into the heart.  When an ICD detects rapid heart rhythm (tachycardia), it sends a low-energy shock to the heart to restore the heartbeat to normal  (cardioversion). If cardioversion does not work or if the ICD detects uncoordinated heart contractions (fibrillation), it delivers a high-energy shock to the heart (defibrillation) to restart the heart.  You will need to stay in the hospital for 1-2 days to make sure your ICD is working. This information is not intended to replace advice given to you by your health care provider. Make sure you discuss any questions you have with your health care provider. Document Revised: 04/18/2017 Document Reviewed: 05/15/2016 Elsevier Patient Education  2020 [employer].

## 2019-07-23 NOTE — Progress Notes (Signed)
PCP: Drosinis, Pamalee Leyden, PA-C HF Cardiology: Dr. Aundra Dubin  HPI:  Ann Bernard is a 74 y.o. with history of HTN, hyperlipidemia, tobacco abuse, and chronic systolic heart failure.Of note, her brother has heart failure and has an ICD.   Admitted to [facility] in 10/20 with acute systolic heart failure. CTA negative for PE. ECHO was completed and showed severely reduced EF. Diagnostic cath was performed as noted below, mild coronary disease not likely to have caused cardiomyopathy.  Diuresed with IV lasix and transitioned to lasix 20 mg daily. Started on digoxin, carvedilol, and Entresto. Discharge weight 174 pounds.      Echo was done 06/25/19 and reviewed, EF 20-25% with septal-lateral dyssynchrony.   Recently presented to HF Clinic for follow up with Dr. Aundra Dubin on 06/25/19.  Generally doing well, no significant exertional dyspnea.  No chest pain.  No orthopnea/PND.  Wilder Glade was not on her med list but she thinks she is taking it.  She had pain primarily in her right hip and calf, often with exertion. No lightheadedness or syncope, no palpitations.   Recently returned to HF clinic for pharmacist medication titration. Overall she was feeling well. No dizziness, lightheadedness, chest pain or palpations. Her breathing was fine and she could walk for 15 minutes before becoming SOB. She does weigh herself daily at home. She stated her weight had been stable at 168 lbs at home. Her weight was down 7 lbs in clinic. No LEE, PND or orthopnea. Her appetite was good and she follows a low salt diet.  Today she returns to HF clinic for pharmacist medication titration.  At recent visits to clinic, carvedilol was increased to 6.25 mg BID and furosemide was changed to PRN. Multiple medication errors were noted and addressed. Overall she is feeling well today. No dizziness, lightheadedness, chest pain or palpitations. No SOB/DOE.  She weighs herself daily at home and notes her weight has been stable at 160 lbs recently.  No LEE, PND or orthopnea. Her appetite is good and she follows a low salt diet. She is still having difficulties filling her pill box and taking medications as prescribed. She was accidentally taking furosemide 40 mg daily (instead of 20 mg PRN) because she got confused and she didn't realize she had multiple bottles of the same medication. Filling her pill box gives her a lot of anxiety and she is interested in the paramedicine program to see if they can help her.    HF Medications: Carvedilol 6.25 mg BID Entresto 49/51 mg BID Spironolactone 25 mg daily Dapagliflozin 10 mg daily Digoxin 0.125 mg daily Furosemide 20 mg PRN  Has the patient been experiencing any side effects to the medications prescribed?  no  Does the patient have any problems obtaining medications due to transportation or finances?   No - has Medicare with low income subsidy  Understanding of regimen: poor Understanding of indications: poor Potential of compliance: very compliant but has poor health literacy, contributing to mistakes in medication administration Patient understands to avoid NSAIDs. Patient understands to avoid decongestants.    Pertinent Lab Values: . Serum creatinine 0.93, BUN 13, Potassium 4.1, Sodium 142,  Digoxin 0.5 ng/mL   Vital Signs: . Weight: 163.2 lbs (last clinic weight: 165.2 lbs) . Blood pressure: 120/70 . Heart rate: 75   Assessment: 1. Chronic systolic CHF: Nonischemic cardiomyopathy. Echo 10/20 with EF <20%. LHC/RHC 10/20 showed nonobstructive coronary disease, CI 2.3. Cardiac MRI (10/20) showed EF 13%, moderate LV dilation, septal-lateral dyssynchrony c/w LBBB and diffuse  severe hypokinesis, normal RV size with moderate systolic dysfunction EF 30%, mild-moderate MR, moderate AI, LGE at inferior RV insertion site (nonspecific). Echo was repeated today and reviewed, EF remains 20-25%. She has a wide LBBB.?Familial cardiomyopathy, brother with ICD but do not know why.  - On exam,  she is not volume overloaded.  NYHA class II symptoms.  - Labs stable: Scr 0.93, K 4.1 - Continue furosemide 20 mg PRN. Reinforced how to use PRN furosemide.    - Continue carvedilol 6.25 mg BID.  - Increase Entresto to 97/103 mg BID. Repeat BMET in 3 weeks.  - Continue spironolactone 25 mg daily.  - Continue dapagliflozin 10 mg daily     - Continue digoxin 0.125 mg daily. Level 0.5 ng/mL on 06/25/19 -Referred to EP for CRT-D  -Referred to paramedicine for help with medication compliance. Also recommend she fill all her medications as [facility] to help decrease medication errors and reduce polypharmacy.  2. CAD: Nonobstructive CAD.  - Continue atorvastatin, lipids next appt.   3. Smoking: She has now quit.        Plan: 1) Medication changes: Based on clinical presentation, vital signs and recent labs will increase Entresto to 97/103 mg BID. 2) Follow-up: 3 weeks with Dr. Jonn Shingles, PharmD, BCPS, [facility], CPP Heart Failure Clinic Pharmacist [phone]

## 2019-08-04 ENCOUNTER — Telehealth (HOSPITAL_COMMUNITY): Payer: Self-pay | Admitting: Licensed Clinical Social Worker

## 2019-08-04 ENCOUNTER — Telehealth: Payer: Self-pay | Admitting: Licensed Clinical Social Worker

## 2019-08-04 ENCOUNTER — Other Ambulatory Visit (HOSPITAL_COMMUNITY): Payer: Self-pay | Admitting: Cardiology

## 2019-08-04 ENCOUNTER — Ambulatory Visit (HOSPITAL_COMMUNITY)
Admission: RE | Admit: 2019-08-04 | Discharge: 2019-08-04 | Disposition: A | Discharge status: Home / Self Care | Payer: Medicare HMO | Source: Ambulatory Visit | Attending: Internal Medicine | Admitting: Internal Medicine

## 2019-08-04 ENCOUNTER — Other Ambulatory Visit: Payer: Self-pay

## 2019-08-04 VITALS — BP 120/72 | HR 75 | Wt 163.2 lb

## 2019-08-04 DIAGNOSIS — I110 Hypertensive heart disease with heart failure: Secondary | ICD-10-CM | POA: Insufficient documentation

## 2019-08-04 DIAGNOSIS — Z79899 Other long term (current) drug therapy: Secondary | ICD-10-CM | POA: Diagnosis not present

## 2019-08-04 DIAGNOSIS — Z95810 Presence of automatic (implantable) cardiac defibrillator: Secondary | ICD-10-CM | POA: Insufficient documentation

## 2019-08-04 DIAGNOSIS — Z7984 Long term (current) use of oral hypoglycemic drugs: Secondary | ICD-10-CM | POA: Insufficient documentation

## 2019-08-04 DIAGNOSIS — Z87891 Personal history of nicotine dependence: Secondary | ICD-10-CM | POA: Insufficient documentation

## 2019-08-04 DIAGNOSIS — E785 Hyperlipidemia, unspecified: Secondary | ICD-10-CM | POA: Insufficient documentation

## 2019-08-04 DIAGNOSIS — I2510 Atherosclerotic heart disease of native coronary artery without angina pectoris: Secondary | ICD-10-CM | POA: Diagnosis not present

## 2019-08-04 DIAGNOSIS — I428 Other cardiomyopathies: Secondary | ICD-10-CM | POA: Insufficient documentation

## 2019-08-04 DIAGNOSIS — I5022 Chronic systolic (congestive) heart failure: Secondary | ICD-10-CM | POA: Diagnosis present

## 2019-08-04 LAB — BASIC METABOLIC PANEL
Anion gap: 10 (ref 5–15)
BUN: 13 mg/dL (ref 8–23)
CO2: 29 mmol/L (ref 22–32)
Calcium: 9.7 mg/dL (ref 8.9–10.3)
Chloride: 103 mmol/L (ref 98–111)
Creatinine, Ser: 0.93 mg/dL (ref 0.44–1.00)
GFR calc Af Amer: >60 mL/min (ref >60)
GFR calc non Af Amer: >60 mL/min (ref >60)
Glucose, Bld: 109 mg/dL — ABNORMAL HIGH (ref 70–99)
Potassium: 4.1 mmol/L (ref 3.5–5.1)
Sodium: 142 mmol/L (ref 135–145)

## 2019-08-04 MED ORDER — FUROSEMIDE 20 MG PO TABS: 20.0000 mg | ORAL_TABLET | ORAL | 3 refills | Status: DC | PRN

## 2019-08-04 MED ORDER — SACUBITRIL-VALSARTAN 97-103 MG PO TABS: 1.0000 | ORAL_TABLET | Freq: Two times a day (BID) | ORAL | 11 refills | Status: DC

## 2019-08-04 MED ORDER — DIGOXIN 125 MCG PO TABS: 0.1250 mg | ORAL_TABLET | Freq: Every day | ORAL | 3 refills | Status: DC

## 2019-08-04 MED FILL — ENTRESTO 97 MG-103 MG TAB: 97-103 | 30 days supply | Qty: 60 | Fill #0

## 2019-08-04 MED FILL — SPIRONOLACTONE 25 MG TABS: 25 | 90 days supply | Qty: 90 | Fill #0

## 2019-08-04 MED FILL — DIGOXIN 0.125 MG TABLET: 125 | 90 days supply | Qty: 90 | Fill #0

## 2019-08-04 NOTE — Patient Instructions (Signed)
It was a pleasure seeing you today!  MEDICATIONS: -We are changing your medications today -Increase Entresto to 97/103 mg (1 tablet) twice daily. Please THROW AWAY the lower strength when you receive your new prescription. -Call if you have questions about your medications.  LABS: -We will call you if your labs need attention.  NEXT APPOINTMENT: Return to clinic in 3 weeks with Dr. Shirlee Latch.  In general, to take care of your heart failure: -Limit your fluid intake to 2 Liters (half-gallon) per day.   -Limit your salt intake to ideally 2-3 grams (2000-3000 mg) per day. -Weigh yourself daily and record, and bring that "weight diary" to your next appointment.  (Weight gain of 2-3 pounds in 1 day typically means fluid weight.) -The medications for your heart are to help your heart and help you live longer.   -Please contact us before stopping any of your heart medications.  Call the clinic at [phone] with questions or to reschedule future appointments.

## 2019-08-04 NOTE — Telephone Encounter (Signed)
CSW consulted to speak with pt regarding [employer] program do to concerns with pt medication errors.  CSW called pt to discuss- unable to reach- left message requesting return call  Burna Sis, LCSW Clinical Social Worker Advanced Heart Failure Clinic Desk#: [phone] Cell#: [phone]

## 2019-08-04 NOTE — Telephone Encounter (Signed)
Entered in error

## 2019-08-05 ENCOUNTER — Telehealth (HOSPITAL_COMMUNITY): Payer: Self-pay | Admitting: Licensed Clinical Social Worker

## 2019-08-05 NOTE — Telephone Encounter (Signed)
Paramedicine Initial Assessment:  Housing:  In what kind of housing do you live? House/apt/trailer/shelter? apt  Do you rent/pay a mortgage/own? rent  Do you live with anyone? no  Are you currently worried about losing your housing? no  Within the past 12 months have you ever stayed outside, in a car, tent, a shelter, or temporarily with someone? no  Within the past 12 months have you been unable to get utilities when it was really needed? no  Social:  What is your current marital status? widowed  Do you have any children? 2- both live locally  Food:  Within the past 12 months were you ever worried that food would run out before you got money to buy more? no  Within the past 26months have you run out of food and didn't have money to buy more? no  Income:  What is your current source of income? Social Security- [phone]  How hard is it for you to pay for the basics like food housing, medical care, and utilities?  Do you have outstanding medical bills?  Insurance:  Are you currently insured? [employer] Medicare  Do you have prescription coverage?  If no insurance, have you applied for coverage (Medicaid, disability, marketplace etc)?  Transportation:  Do you have transportation to your medical appointments? yes   If yes, how? Son transports to appts- pt reports he is flexible with this and it has never prevented her from coming to an appt  In the past 12 months has lack of transportation kept you from medical appts or from getting medications? no  In the past 12 months has lack of transportation kept you from meetings, work, or getting things you needed? no   Daily Health Needs: Do you have a working scale at home? yes  How do you manage your medications at home? pillbox  Do you ever take your medications differently than prescribed? Yes but unintentionally- pt referred to paramedicine because she was taking medications differently and getting confused easily.  Do you  have issues affording your medications? Not usually but states her son can help her if she struggles.  CSW helped pt apply for Extra Help program as she meets income requirements and is getting money deducted for insurance at this time.  If yes, has this ever prevented you from obtaining medications? no  Do you have any concerns with mobility at home? no  Do you use any assistive devices at home or have PCS at home? no  Do you have a PCP? Yes- Lupita Leash Drosinis   Are there any additional barriers you see to getting the care you need? None reported  CSW will continue to follow through paramedicine program and assist as needed.  Burna Sis, LCSW Clinical Social Worker Advanced [facility] Failure Clinic Desk#: [phone] Cell#: [phone]

## 2019-08-06 ENCOUNTER — Other Ambulatory Visit (HOSPITAL_COMMUNITY): Payer: Self-pay

## 2019-08-06 NOTE — Progress Notes (Signed)
Paramedicine Encounter    Patient ID: Ann Bernard, female    DOB: [birth year], 74 y.o.   MRN: [id number]   Patient Care Team: Ann Bernard, Ann Reader, PA-C as PCP - General (Internal Medicine) Ann Lemming, MD as PCP - Electrophysiology (Cardiology)  Patient Active Problem List   Diagnosis Date Noted  . Acute systolic HF (heart failure) (HCC)   . Precordial chest pain   . Acute coronary syndrome (HCC) 03/15/2019  . Acute CHF (congestive heart failure) (HCC) 03/14/2019  . DYSLIPIDEMIA 01/25/2009  . TOBACCO ABUSE 01/25/2009  . Essential hypertension 01/25/2009  . ECTOPIC PREGNANCY 01/25/2009  . UTERINE PROLAPSE 01/24/2009  . URINARY INCONTINENCE 01/24/2009    Current Outpatient Medications:  .  alendronate (FOSAMAX) 70 MG tablet, Take 70 mg by mouth once a week. Every Monday, Disp: , Rfl:  .  atorvastatin (LIPITOR) 40 MG tablet, Take 1 tablet (40 mg total) by mouth daily at 6 PM., Disp: 90 tablet, Rfl: 3 .  carvedilol (COREG) 6.25 MG tablet, Take 1 tablet (6.25 mg total) by mouth 2 (two) times daily with a meal., Disp: 60 tablet, Rfl: 11 .  dapagliflozin propanediol (FARXIGA) 10 MG TABS tablet, Take 10 mg by mouth daily before breakfast., Disp: 30 tablet, Rfl: 5 .  digoxin (LANOXIN) 0.125 MG tablet, Take 1 tablet (0.125 mg total) by mouth daily., Disp: 90 tablet, Rfl: 3 .  furosemide (LASIX) 20 MG tablet, Take 1 tablet (20 mg total) by mouth as needed for fluid., Disp: 90 tablet, Rfl: 3 .  Multiple Vitamins-Minerals (PRESERVISION AREDS 2 PO), Take 1 capsule by mouth daily., Disp: , Rfl:  .  potassium chloride (KLOR-CON) 10 MEQ tablet, TAKE 1 TABLET BY MOUTH WHEN YOU TAKE LASIX, Disp: 90 tablet, Rfl: 1 .  sacubitril-valsartan (ENTRESTO) 97-103 MG, Take 1 tablet by mouth 2 (two) times daily., Disp: 60 tablet, Rfl: 11 .  spironolactone (ALDACTONE) 25 MG tablet, Take 1 tablet (25 mg total) by mouth daily., Disp: 90 tablet, Rfl: 3 .  bisacodyl (DULCOLAX) 5 MG EC tablet, Take 5 mg by  mouth daily as needed for moderate constipation., Disp: , Rfl:  .  diphenhydrAMINE (SOMINEX) 25 MG tablet, Take 1 tablet by mouth as needed for itching., Disp: , Rfl:  Allergies  Allergen Reactions  . Aspirin Nausea Only and Other (See Comments)    GI Upset, stomach cramps       Social History   Socioeconomic History  . Marital status: Widowed    Spouse name: Not on file  . Number of children: Not on file  . Years of education: Not on file  . Highest education level: Not on file  Occupational History  . Not on file  Tobacco Use  . Smoking status: Current Some Day Smoker  . Smokeless tobacco: Never Used  Substance and Sexual Activity  . Alcohol use: No  . Drug use: Never  . Sexual activity: Not Currently  Other Topics Concern  . Not on file  Social History Narrative  . Not on file   Social Determinants of Health   Financial Resource Strain: Low Risk   . Difficulty of Paying Living Expenses: Not very hard  Food Insecurity: No Food Insecurity  . Worried About Programme researcher, broadcasting/film/video in the Last Year: Never true  . Ran Out of Food in the Last Year: Never true  Transportation Needs: No Transportation Needs  . Lack of Transportation (Medical): No  . Lack of Transportation (Non-Medical): No  Physical Activity: Unknown  .  Days of Exercise per Week: 0 days  . Minutes of Exercise per Session: Not on file  Stress: No Stress Concern Present  . Feeling of Stress : Not at all  Social Connections: Somewhat Isolated  . Frequency of Communication with Friends and Family: More than three times a week  . Frequency of Social Gatherings with Friends and Family: Once a week  . Attends Religious Services: More than 4 times per year  . Active Member of Clubs or Organizations: No  . Attends Archivist Meetings: Never  . Marital Status: Widowed  Intimate Partner Violence: Not At Risk  . Fear of Current or Ex-Partner: No  . Emotionally Abused: No  . Physically Abused: No  .  Sexually Abused: No    Physical Exam Cardiovascular:     Rate and Rhythm: Normal rate and regular rhythm.     Pulses: Normal pulses.  Pulmonary:     Effort: Pulmonary effort is normal.     Breath sounds: Normal breath sounds.  Musculoskeletal:        General: Normal range of motion.     Right lower leg: No edema.     Left lower leg: No edema.  Skin:    General: Skin is warm and dry.  Neurological:     Mental Status: She is alert and oriented to person, place, and time.  Psychiatric:        Mood and Affect: Mood normal.         Future Appointments  Date Time Provider [place]  08/24/2019 11:15 AM [facility] None  08/24/2019 11:40 AM Larey Dresser, MD [facility] None  09/07/2019 11:30 AM [facility]    BP 112/62 (BP Location: Left Arm, Patient Position: Sitting, Cuff Size: Normal)   Pulse 70   Resp 16   Wt 160 lb 12.8 oz (72.9 kg)   SpO2 98%   BMI 25.18 kg/m   Weight yesterday- 159.6 lb Last visit weight- 163 lb  [name] was seen at home today for our initial visit. She reported feeling generally well, denying chest pain, SOB, headache, dizziness, orthopnea, fever or cough since her appointment with Ann Bernard, Ann Bernard. She reported being compliant with her medications since that appointment as well which was a major point of concern. She no longer had the old dose of Entresto and she was not taking Lasix or potassium regularly. We spoke at length about her medications and made sure she had a good understanding of each. She is using a pillbox however it was nearly empty so we refilled it together. I will follow up next week.   Ann Bernard, EMT 08/06/19  ACTION: Home visit completed Next visit planned for 1 week

## 2019-08-10 ENCOUNTER — Telehealth (HOSPITAL_COMMUNITY): Payer: Self-pay

## 2019-08-10 NOTE — Telephone Encounter (Signed)
I called Ann Bernard to schedule an appointment. She state she would be available in the morning so I advised I would see her between 10:00-10:30. She was understanding and agreeable.   Jacqualine Code, EMT 08/10/19

## 2019-08-11 ENCOUNTER — Other Ambulatory Visit (HOSPITAL_COMMUNITY): Payer: Self-pay

## 2019-08-11 NOTE — Progress Notes (Signed)
Paramedicine Encounter    Patient ID: Ann Bernard, female    DOB: 04/07/1946, 74 y.o.   MRN: [id number]   Patient Care Team: Drosinis, Leonia Reader, PA-C as PCP - General (Internal Medicine) Regan Lemming, MD as PCP - Electrophysiology (Cardiology)  Patient Active Problem List   Diagnosis Date Noted  . Acute systolic HF (heart failure) (HCC)   . Precordial chest pain   . Acute coronary syndrome (HCC) 03/15/2019  . Acute CHF (congestive heart failure) (HCC) 03/14/2019  . DYSLIPIDEMIA 01/25/2009  . TOBACCO ABUSE 01/25/2009  . Essential hypertension 01/25/2009  . ECTOPIC PREGNANCY 01/25/2009  . UTERINE PROLAPSE 01/24/2009  . URINARY INCONTINENCE 01/24/2009    Current Outpatient Medications:  .  alendronate (FOSAMAX) 70 MG tablet, Take 70 mg by mouth once a week. Every Monday, Disp: , Rfl:  .  atorvastatin (LIPITOR) 40 MG tablet, Take 1 tablet (40 mg total) by mouth daily at 6 PM., Disp: 90 tablet, Rfl: 3 .  bisacodyl (DULCOLAX) 5 MG EC tablet, Take 5 mg by mouth daily as needed for moderate constipation., Disp: , Rfl:  .  carvedilol (COREG) 6.25 MG tablet, Take 1 tablet (6.25 mg total) by mouth 2 (two) times daily with a meal., Disp: 60 tablet, Rfl: 11 .  dapagliflozin propanediol (FARXIGA) 10 MG TABS tablet, Take 10 mg by mouth daily before breakfast., Disp: 30 tablet, Rfl: 5 .  digoxin (LANOXIN) 0.125 MG tablet, Take 1 tablet (0.125 mg total) by mouth daily., Disp: 90 tablet, Rfl: 3 .  Multiple Vitamins-Minerals (PRESERVISION AREDS 2 PO), Take 1 capsule by mouth daily., Disp: , Rfl:  .  sacubitril-valsartan (ENTRESTO) 97-103 MG, Take 1 tablet by mouth 2 (two) times daily., Disp: 60 tablet, Rfl: 11 .  spironolactone (ALDACTONE) 25 MG tablet, Take 1 tablet (25 mg total) by mouth daily., Disp: 90 tablet, Rfl: 3 .  diphenhydrAMINE (SOMINEX) 25 MG tablet, Take 1 tablet by mouth as needed for itching., Disp: , Rfl:  .  furosemide (LASIX) 20 MG tablet, Take 1 tablet (20 mg total) by  mouth as needed for fluid. (Patient not taking: Reported on 08/11/2019), Disp: 90 tablet, Rfl: 3 .  potassium chloride (KLOR-CON) 10 MEQ tablet, TAKE 1 TABLET BY MOUTH WHEN YOU TAKE LASIX (Patient not taking: Reported on 08/11/2019), Disp: 90 tablet, Rfl: 1 Allergies  Allergen Reactions  . Aspirin Nausea Only and Other (See Comments)    GI Upset, stomach cramps       Social History   Socioeconomic History  . Marital status: Widowed    Spouse name: Not on file  . Number of children: Not on file  . Years of education: Not on file  . Highest education level: Not on file  Occupational History  . Not on file  Tobacco Use  . Smoking status: Current Some Day Smoker  . Smokeless tobacco: Never Used  Substance and Sexual Activity  . Alcohol use: No  . Drug use: Never  . Sexual activity: Not Currently  Other Topics Concern  . Not on file  Social History Narrative  . Not on file   Social Determinants of Health   Financial Resource Strain: Low Risk   . Difficulty of Paying Living Expenses: Not very hard  Food Insecurity: No Food Insecurity  . Worried About Programme researcher, broadcasting/film/video in the Last Year: Never true  . Ran Out of Food in the Last Year: Never true  Transportation Needs: No Transportation Needs  . Lack of Transportation (Medical): No  .  Lack of Transportation (Non-Medical): No  Physical Activity: Unknown  . Days of Exercise per Week: 0 days  . Minutes of Exercise per Session: Not on file  Stress: No Stress Concern Present  . Feeling of Stress : Not at all  Social Connections: Somewhat Isolated  . Frequency of Communication with Friends and Family: More than three times a week  . Frequency of Social Gatherings with Friends and Family: Once a week  . Attends Religious Services: More than 4 times per year  . Active Member of Clubs or Organizations: No  . Attends Archivist Meetings: Never  . Marital Status: Widowed  Intimate Partner Violence: Not At Risk  . Fear of  Current or Ex-Partner: No  . Emotionally Abused: No  . Physically Abused: No  . Sexually Abused: No    Physical Exam Cardiovascular:     Rate and Rhythm: Normal rate and regular rhythm.     Pulses: Normal pulses.  Pulmonary:     Effort: Pulmonary effort is normal.     Breath sounds: Normal breath sounds.  Abdominal:     General: There is no distension.  Musculoskeletal:        General: Normal range of motion.     Right lower leg: No edema.     Left lower leg: No edema.  Skin:    General: Skin is warm and dry.     Capillary Refill: Capillary refill takes less than 2 seconds.  Neurological:     Mental Status: She is alert and oriented to person, place, and time.  Psychiatric:        Mood and Affect: Mood normal.         Future Appointments  Date Time Provider [place]  08/24/2019 11:15 AM [facility]CREENING [facility] None  08/24/2019 11:40 AM Larey Dresser, MD [facility] None  09/07/2019 11:30 AM [facility]    BP 100/62 (BP Location: Left Arm, Patient Position: Sitting, Cuff Size: Normal)   Pulse 88   Resp 16   Wt 162 lb (73.5 kg)   SpO2 98%   BMI 25.37 kg/m   Weight yesterday- 162 lb Last visit weight- 160 lb  Ms Laplant was seen at home today and reported feeling well. She denied chest pain, SOB, headache, dizziness, orthopnea, fever or cough over the past week. She stated she has been compliant with her medications over the past week and her weight has been stable. Her medications were verified and her pillbox was refilled. I will follow up next week.   Jacquiline Doe, EMT 08/11/19  ACTION: Home visit completed Next visit planned for 1 week

## 2019-08-18 ENCOUNTER — Telehealth (HOSPITAL_COMMUNITY): Payer: Self-pay

## 2019-08-18 NOTE — Telephone Encounter (Signed)
I called Ann Bernard to schedule an appointment. She did not answer so I left a message requesting she call me back as soon as she is able.   Jacqualine Code, EMT 08/18/19

## 2019-08-18 NOTE — Telephone Encounter (Signed)
I called Ann Bernard to see if she was available for a visit today. She did not answer so I left a message requesting she call me back when she is able.   Jacqualine Code, EMT 08/18/19

## 2019-08-24 ENCOUNTER — Other Ambulatory Visit (HOSPITAL_COMMUNITY)
Admission: RE | Admit: 2019-08-24 | Discharge: 2019-08-24 | Disposition: A | Discharge status: Home / Self Care | Payer: Medicare HMO | Source: Ambulatory Visit | Attending: Cardiology | Admitting: Cardiology

## 2019-08-24 ENCOUNTER — Telehealth (HOSPITAL_COMMUNITY): Payer: Self-pay

## 2019-08-24 ENCOUNTER — Encounter (HOSPITAL_COMMUNITY): Payer: Medicare HMO | Admitting: Cardiology

## 2019-08-24 DIAGNOSIS — Z01812 Encounter for preprocedural laboratory examination: Secondary | ICD-10-CM | POA: Insufficient documentation

## 2019-08-24 DIAGNOSIS — Z20822 Contact with and (suspected) exposure to covid-19: Secondary | ICD-10-CM | POA: Diagnosis not present

## 2019-08-24 LAB — SARS CORONAVIRUS 2 (TAT 6-24 HRS): SARS Coronavirus 2: NEGATIVE

## 2019-08-24 MED FILL — FARXIGA 10 MG TABLET: 10 | 30 days supply | Qty: 30 | Fill #1

## 2019-08-24 NOTE — Telephone Encounter (Signed)
I called MS Walthers to see if she was OK after I was unabel to reach her multiple times last week and she was a no-show to her clinic appointment today. She did not answer so I left a message requesting she call me back as soon as possible.   Jacqualine Code, EMT 08/24/19

## 2019-08-26 ENCOUNTER — Telehealth: Payer: Self-pay | Admitting: Cardiology

## 2019-08-26 NOTE — Telephone Encounter (Signed)
Pt aware I did not call, but hospital most likely did. Informed that person at hospital, who called, has already left for the day. Reviewed procedure instructions, including surgical scrub, with pt who verbalized understanding.

## 2019-08-26 NOTE — Telephone Encounter (Signed)
   Pt said she received a call from [facility], she said it probably about her surgery tomorrow. She would like to speak with her  Please call

## 2019-08-27 ENCOUNTER — Ambulatory Visit (HOSPITAL_COMMUNITY)
Admission: RE | Disposition: A | Discharge status: Home / Self Care | Payer: Medicare HMO | Source: Home / Self Care | Attending: Cardiology

## 2019-08-27 ENCOUNTER — Ambulatory Visit (HOSPITAL_COMMUNITY): Payer: Medicare Other

## 2019-08-27 ENCOUNTER — Other Ambulatory Visit: Payer: Self-pay

## 2019-08-27 ENCOUNTER — Ambulatory Visit (HOSPITAL_COMMUNITY)
Admission: RE | Admit: 2019-08-27 | Discharge: 2019-08-27 | Disposition: A | Discharge status: Home / Self Care | Payer: Medicare Other | Attending: Cardiology | Admitting: Cardiology

## 2019-08-27 DIAGNOSIS — I447 Left bundle-branch block, unspecified: Secondary | ICD-10-CM | POA: Insufficient documentation

## 2019-08-27 DIAGNOSIS — E785 Hyperlipidemia, unspecified: Secondary | ICD-10-CM | POA: Insufficient documentation

## 2019-08-27 DIAGNOSIS — I110 Hypertensive heart disease with heart failure: Secondary | ICD-10-CM | POA: Insufficient documentation

## 2019-08-27 DIAGNOSIS — Z886 Allergy status to analgesic agent status: Secondary | ICD-10-CM | POA: Diagnosis not present

## 2019-08-27 DIAGNOSIS — I2510 Atherosclerotic heart disease of native coronary artery without angina pectoris: Secondary | ICD-10-CM | POA: Diagnosis not present

## 2019-08-27 DIAGNOSIS — I428 Other cardiomyopathies: Secondary | ICD-10-CM | POA: Insufficient documentation

## 2019-08-27 DIAGNOSIS — I5022 Chronic systolic (congestive) heart failure: Secondary | ICD-10-CM | POA: Diagnosis not present

## 2019-08-27 DIAGNOSIS — F17200 Nicotine dependence, unspecified, uncomplicated: Secondary | ICD-10-CM | POA: Insufficient documentation

## 2019-08-27 DIAGNOSIS — Z95818 Presence of other cardiac implants and grafts: Secondary | ICD-10-CM

## 2019-08-27 DIAGNOSIS — Z006 Encounter for examination for normal comparison and control in clinical research program: Secondary | ICD-10-CM | POA: Diagnosis present

## 2019-08-27 HISTORY — PX: BIV ICD INSERTION CRT-D: EP1195

## 2019-08-27 LAB — CBC
HCT: 44.8 % (ref 36.0–46.0)
Hemoglobin: 14.5 g/dL (ref 12.0–15.0)
MCH: 32.7 pg (ref 26.0–34.0)
MCHC: 32.4 g/dL (ref 30.0–36.0)
MCV: 101.1 fL — ABNORMAL HIGH (ref 80.0–100.0)
Platelets: 179 10*3/uL (ref 150–400)
RBC: 4.43 MIL/uL (ref 3.87–5.11)
RDW: 13.2 % (ref 11.5–15.5)
WBC: 6.9 10*3/uL (ref 4.0–10.5)
nRBC: 0 % (ref 0.0–0.2)

## 2019-08-27 SURGERY — BIV ICD INSERTION CRT-D

## 2019-08-27 MED ORDER — IOHEXOL 350 MG/ML SOLN
INTRAVENOUS | Status: DC | PRN
  Administered 2019-08-27: 8 mL

## 2019-08-27 MED ORDER — ACETAMINOPHEN 325 MG PO TABS: 325.0000 mg | ORAL_TABLET | ORAL | Status: DC | PRN

## 2019-08-27 MED ORDER — HEPARIN (PORCINE) IN NACL 2-0.9 UNITS/ML
INTRAMUSCULAR | Status: AC | PRN
  Administered 2019-08-27: 500 mL

## 2019-08-27 MED ORDER — LIDOCAINE HCL 1 % IJ SOLN
INTRAMUSCULAR | Status: AC
  Filled 2019-08-27: qty 60

## 2019-08-27 MED ORDER — MIDAZOLAM HCL 5 MG/5ML IJ SOLN
INTRAMUSCULAR | Status: DC | PRN
  Administered 2019-08-27 (×4): 1 mg via INTRAVENOUS

## 2019-08-27 MED ORDER — ONDANSETRON HCL 4 MG/2ML IJ SOLN: 4.0000 mg | Freq: Four times a day (QID) | INTRAMUSCULAR | Status: DC | PRN

## 2019-08-27 MED ORDER — MIDAZOLAM HCL 5 MG/5ML IJ SOLN
INTRAMUSCULAR | Status: AC
  Filled 2019-08-27: qty 5

## 2019-08-27 MED ORDER — FENTANYL CITRATE (PF) 100 MCG/2ML IJ SOLN
INTRAMUSCULAR | Status: AC
  Filled 2019-08-27: qty 2

## 2019-08-27 MED ORDER — CEFAZOLIN SODIUM-DEXTROSE 1-4 GM/50ML-% IV SOLN: 1.0000 g | Freq: Four times a day (QID) | INTRAVENOUS | Status: DC

## 2019-08-27 MED ORDER — FENTANYL CITRATE (PF) 100 MCG/2ML IJ SOLN
INTRAMUSCULAR | Status: DC | PRN
  Administered 2019-08-27 (×4): 25 ug via INTRAVENOUS

## 2019-08-27 MED ORDER — SODIUM CHLORIDE 0.9 % IV SOLN: INTRAVENOUS | Status: DC

## 2019-08-27 MED ORDER — CEFAZOLIN SODIUM-DEXTROSE 2-4 GM/100ML-% IV SOLN
2.0000 g | INTRAVENOUS | Status: AC
  Administered 2019-08-27: 13:00:00 2 g via INTRAVENOUS

## 2019-08-27 MED ORDER — SODIUM CHLORIDE 0.9 % IV SOLN
INTRAVENOUS | Status: AC
  Filled 2019-08-27: qty 2

## 2019-08-27 MED ORDER — LIDOCAINE HCL (PF) 1 % IJ SOLN
INTRAMUSCULAR | Status: DC | PRN
  Administered 2019-08-27: 13:00:00 60 mL

## 2019-08-27 MED ORDER — HEPARIN (PORCINE) IN NACL 1000-0.9 UT/500ML-% IV SOLN
INTRAVENOUS | Status: AC
  Filled 2019-08-27: qty 500

## 2019-08-27 MED ORDER — CEFAZOLIN SODIUM-DEXTROSE 2-4 GM/100ML-% IV SOLN
INTRAVENOUS | Status: AC
  Filled 2019-08-27: qty 100

## 2019-08-27 MED ORDER — SODIUM CHLORIDE 0.9 % IV SOLN
80.0000 mg | INTRAVENOUS | Status: AC
  Administered 2019-08-27: 15:00:00 80 mg

## 2019-08-27 MED ORDER — CHLORHEXIDINE GLUCONATE 4 % EX LIQD
4.0000 "application " | Freq: Once | CUTANEOUS | Status: DC
  Filled 2019-08-27: qty 60

## 2019-08-27 SURGICAL SUPPLY — 18 items
BALLN COR SINUS VENO 6FR 80 (BALLOONS) ×2
BALLOON COR SINUS VENO 6FR 80 (BALLOONS) ×1 IMPLANT
CABLE SURGICAL S-101-97-12 (CABLE) ×2 IMPLANT
CATH CPS DIRECT 135 DS2C020 (CATHETERS) ×2 IMPLANT
CATH CPS QUART CN DS2N029-65 (CATHETERS) ×2 IMPLANT
CATH HEX JOSEPH 2-5-2 65CM 6F (CATHETERS) ×2 IMPLANT
CPS IMPLANT KIT 410190 (MISCELLANEOUS) ×2 IMPLANT
ICD GALLANT HFCRTD CDHFA500Q (ICD Generator) ×2 IMPLANT
LEAD DURATA 7122Q-65CM (Lead) ×2 IMPLANT
LEAD QUARTET 1458QL-86 (Lead) ×1 IMPLANT
LEAD TENDRIL MRI 52CM LPA1200M (Lead) ×2 IMPLANT
PAD PRO RADIOLUCENT 2001M-C (PAD) ×2 IMPLANT
QUARTET 1458QL-86 (Lead) ×2 IMPLANT
SHEATH 7FR PRELUDE SNAP 13 (SHEATH) ×2 IMPLANT
SHEATH 8FR PRELUDE SNAP 13 (SHEATH) ×2 IMPLANT
TRAY PACEMAKER INSERTION (PACKS) ×2 IMPLANT
WIRE ACUITY WHISPER EDS 4648 (WIRE) ×2 IMPLANT
WIRE HI TORQ VERSACORE-J 145CM (WIRE) ×6 IMPLANT

## 2019-08-27 NOTE — Progress Notes (Signed)
Patient and son was given discharge instructions. Both verbalized understanding. 

## 2019-08-27 NOTE — Discharge Instructions (Signed)
After Your ICD (Implantable Cardiac Defibrillator)   . You have a [employer] ICD  ACTIVITY . Do not lift your arm above shoulder height for 1 week after your procedure. After 7 days, you may progress as below.     Friday September 03, 2019  Saturday September 04, 2019 Sunday September 05, 2019 Monday September 06, 2019   . Do not lift, push, pull, or carry anything over 10 pounds with the affected arm until 6 weeks (Friday Oct 08, 2019 ) after your procedure.   . Do NOT DRIVE until you have been seen for your wound check, or as long as instructed by your healthcare provider.   . Ask your healthcare provider when you can go back to work   INCISION/Dressing . If you are on a blood thinner such as Coumadin, Xarelto, Eliquis, Plavix, or Pradaxa please confirm with your provider when this should be resumed as prescribed  . Monitor your defibrillator site for redness, swelling, and drainage. Call the device clinic at [phone] if you experience these symptoms or fever/chills.  . If your incision is sealed with Steri-strips or staples, you may shower 10 days after your procedure or when told by your provider. Do not remove the steri-strips or let the shower hit directly on your site. You may wash around your site with soap and water.    Marland Kitchen Avoid lotions, ointments, or perfumes over your incision until it is well-healed.  . You may use a hot tub or a pool AFTER your wound check appointment if the incision is completely closed.  . Your ICD is designed to protect you from life threatening heart rhythms. Because of this, you may receive a shock.   o 1 shock with no symptoms:  Call the office during business hours. o 1 shock with symptoms (chest pain, chest pressure, dizziness, lightheadedness, shortness of breath, overall feeling unwell):  Call 911. o If you experience 2 or more shocks in 24 hours:  Call 911. o If you receive a shock, you should not drive for 6 months per the [place] DMV IF you receive  appropriate therapy from your ICD.   . ICD Alerts:  Some alerts are vibratory and others beep. These are NOT emergencies. Please call our office to let us know. If this occurs at night or on weekends, it can wait until the next business day. Send a remote transmission.  . If your device is capable of reading fluid status (for heart failure), you will be offered monthly monitoring to review this with you.   DEVICE MANAGEMENT . Remote monitoring is used to monitor your ICD from home. This monitoring is scheduled every 91 days by our office. It allows [place] to keep an eye on the functioning of your device to ensure it is working properly. You will routinely see your Electrophysiologist annually (more often if necessary).   . You should receive your ID card for your new device in 4-8 weeks. Keep this card with you at all times once received. Consider wearing a medical alert bracelet or necklace.  . Your ICD  may be MRI compatible. This will be discussed at your next office visit/wound check.  You should avoid contact with strong electric or magnetic fields.    Do not use amateur (ham) radio equipment or electric (arc) welding torches. MP3 player headphones with magnets should not be used. Some devices are safe to use if held at least 12 inches (30 cm) from your defibrillator. These include power  tools, lawn mowers, and speakers. If you are unsure if something is safe to use, ask your health care provider.   When using your cell phone, hold it to the ear that is on the opposite side from the defibrillator. Do not leave your cell phone in a pocket over the defibrillator.   You may safely use electric blankets, heating pads, computers, and microwave ovens.  Call the office right away if:  You have chest pain.  You feel more than one shock.  You feel more short of breath than you have felt before.  You feel more light-headed than you have felt before.  Your incision starts to open up.  This  information is not intended to replace advice given to you by your health care provider. Make sure you discuss any questions you have with your health care provider.

## 2019-08-27 NOTE — H&P (Signed)
Electrophysiology Office Note   Date:  08/27/2019   ID:  Ann Bernard, DOB February 21, 1946, MRN [id number]  PCP:  Drosinis, Pamalee Leyden, PA-C  Cardiologist:  Aundra Dubin Primary Electrophysiologist:  Merrin Mcvicker Meredith Leeds, MD    Chief Complaint: CHF   History of Present Illness: Ann Bernard is a 74 y.o. female who is being seen today for the evaluation of CHF at the request of No ref. provider found. Presenting today for electrophysiology evaluation.  She has a history significant for hypertension, hyperlipidemia, tobacco abuse, and chronic systolic heart failure.  She presented to [facility] October 2020 with acute systolic heart failure.  Diagnostic cath was performed that showed mild coronary artery disease.  She was started on optimal medical therapy.  Repeat echo shows persistently low ejection fraction.  Today, denies symptoms of palpitations, chest pain, shortness of breath, orthopnea, PND, lower extremity edema, claudication, dizziness, presyncope, syncope, bleeding, or neurologic sequela. The patient is tolerating medications without difficulties. Plan for CRTD today.   Past Medical History:  Diagnosis Date  . Coronary artery disease   . Hypertension    Past Surgical History:  Procedure Laterality Date  . RIGHT/LEFT HEART CATH AND CORONARY ANGIOGRAPHY N/A 03/16/2019   Procedure: RIGHT/LEFT HEART CATH AND CORONARY ANGIOGRAPHY;  Surgeon: Belva Crome, MD;  Location: [facility];  Service: Cardiovascular;  Laterality: N/A;     Current Facility-Administered Medications  Medication Dose Route Frequency Provider Last Rate Last Admin  . 0.9 %  sodium chloride infusion   Intravenous Continuous Korey Prashad Hassell Done, MD      . ceFAZolin (ANCEF) IVPB 2g/100 mL premix  2 g Intravenous On Call Kadrian Partch Hassell Done, MD      . chlorhexidine (HIBICLENS) 4 % liquid 4 application  4 application Topical Once Miranda Frese Hassell Done, MD      . gentamicin (GARAMYCIN) 80 mg in sodium  chloride 0.9 % 500 mL irrigation  80 mg Irrigation On Call Geeta Dworkin, Ocie Doyne, MD        Allergies:   Aspirin   Social History:  The patient  reports that she has been smoking. She has never used smokeless tobacco. She reports that she does not drink alcohol or use drugs.   Family History:  The patient's family history includes Alcoholism in her father; Diabetes in her brother and mother; Diabetes type I in her grandson; Hypertension in her mother.    ROS:  Please see the history of present illness.   Otherwise, review of systems is positive for none.   All other systems are reviewed and negative.    PHYSICAL EXAM: VS:  There were no vitals taken for this visit. , BMI There is no height or weight on file to calculate BMI. GEN: Well nourished, well developed, in no acute distress  HEENT: normal  Neck: no JVD, carotid bruits, or masses Cardiac: RRR; no murmurs, rubs, or gallops,no edema  Respiratory:  clear to auscultation bilaterally, normal work of breathing GI: soft, nontender, nondistended, + BS MS: no deformity or atrophy  Skin: warm and dry Neuro:  Strength and sensation are intact Psych: euthymic mood, full affect  Recent Labs: 03/14/2019: ALT 38; B Natriuretic Peptide 1,035.7 03/17/2019: Hemoglobin 14.3; Platelets 150; TSH 2.833 08/04/2019: BUN 13; Creatinine, Ser 0.93; Potassium 4.1; Sodium 142    Lipid Panel     Component Value Date/Time   CHOL 194 03/16/2019 0251   TRIG 65 03/16/2019 0251   HDL 53 03/16/2019 0251   CHOLHDL 3.7  03/16/2019 0251   VLDL 13 03/16/2019 0251   LDLCALC 128 (H) 03/16/2019 0251   LDLDIRECT 138.3 01/25/2009 1524     Wt Readings from Last 3 Encounters:  08/11/19 73.5 kg  08/06/19 72.9 kg  08/04/19 74 kg      Other studies Reviewed: Additional studies/ records that were reviewed today include: TTE 06/25/19  Review of the above records today demonstrates:  1. Left ventricular ejection fraction, by visual estimation, is 25 to  30%.  The left ventricle has normal function. There is no left ventricular  hypertrophy.  2. Abnormal septal motion consistent with left bundle branch block.  3. Left ventricular diastolic parameters are consistent with Grade I  diastolic dysfunction (impaired relaxation).  4. The left ventricle demonstrates global hypokinesis.  5. Global right ventricle has normal systolic function.The right  ventricular size is normal.  6. Left atrial size was normal.  7. Right atrial size was normal.  8. The mitral valve is normal in structure. Trivial mitral valve  regurgitation. No evidence of mitral stenosis.  9. The tricuspid valve is normal in structure. Tricuspid valve  regurgitation is trivial.  10. The aortic valve is tricuspid. Aortic valve regurgitation is mild.  Mild aortic valve sclerosis without stenosis.  11. The pulmonic valve was not well visualized. Pulmonic valve  regurgitation is not visualized.  12. The inferior vena cava is normal in size with greater than 50%  respiratory variability, suggesting right atrial pressure of 3 mmHg.  13. Severe global reduction in LV systolic function; grade 1 diastolic  dysfunction; mild AI.   RHC/LHC 03/16/19  Right dominant coronary anatomy.  Normal left main.  Widely patent LAD with each of 2 diagonals having 50% ostial narrowing.  Widely patent circumflex.  Minimal luminal irregularities in the proximal mid and distal RCA.  Elevated left ventricular end-diastolic pressure, greater than 20 mmHg.  Mild pulmonary hypertension with mean pressure 33 mmHg.  Pulmonary capillary wedge mean 23 mmHg.  V wave 33 mmHg.   ASSESSMENT AND PLAN:  1.  Chronic systolic heart failure due to nonischemic cardiomyopathy: Currently on optimal medical therapy with carvedilol, Entresto, Aldactone.  CRT-D planned for today.  ICD Criteria  Current LVEF:33%. Within 12 months prior to implant: Yes   Heart failure history: Yes, Class II  Cardiomyopathy  history: Yes, Non-Ischemic Cardiomyopathy.  Atrial Fibrillation/Atrial Flutter: No.  Ventricular tachycardia history: No.  Cardiac arrest history: No.  History of syndromes with risk of sudden death: No.  Previous ICD: No.  Current ICD indication: Primary  PPM indication: No.  Class I or II Bradycardia indication present: No  Beta Blocker therapy for 3 or more months: Yes, prescribed.   Ace Inhibitor/ARB therapy for 3 or more months: Yes, prescribed.    I have seen Ann Bernard is a 74 y.o. femalepre-procedural and has been referred by Shirlee Latch for consideration of ICD implant for primary prevention of sudden death.  The patient's chart has been reviewed and they meet criteria for ICD implant.  I have had a thorough discussion with the patient reviewing options.  The patient and their family (if available) have had opportunities to ask questions and have them answered. The patient and I have decided together through the [facility] Share Decision Support Tool to implant ICD at this time.  Risks, benefits, alternatives to ICD implantation were discussed in detail with the patient today. The patient  understands that the risks include but are not limited to bleeding, infection, pneumothorax, perforation, tamponade, vascular  damage, renal failure, MI, stroke, death, inappropriate shocks, and lead dislodgement and  wishes to proceed.

## 2019-08-30 MED FILL — Lidocaine HCl Local Inj 1%: INTRAMUSCULAR | Qty: 60 | Status: AC

## 2019-08-31 ENCOUNTER — Telehealth (HOSPITAL_COMMUNITY): Payer: Self-pay

## 2019-08-31 NOTE — Telephone Encounter (Signed)
I called Ann Bernard to remind her of her appointment at the HF clinic. She stated she was aware and would be there. I asked that she bring her medications and pillbox so we could get her refilled there and she was agreeable.   Jacqualine Code, EMT 08/31/19

## 2019-09-01 ENCOUNTER — Other Ambulatory Visit: Payer: Self-pay

## 2019-09-01 ENCOUNTER — Ambulatory Visit (HOSPITAL_COMMUNITY)
Admission: RE | Admit: 2019-09-01 | Discharge: 2019-09-01 | Disposition: A | Discharge status: Home / Self Care | Payer: Medicare HMO | Source: Ambulatory Visit | Attending: Cardiology | Admitting: Cardiology

## 2019-09-01 ENCOUNTER — Other Ambulatory Visit (HOSPITAL_COMMUNITY): Payer: Self-pay

## 2019-09-01 VITALS — BP 132/74 | HR 61 | Wt 166.6 lb

## 2019-09-01 DIAGNOSIS — I428 Other cardiomyopathies: Secondary | ICD-10-CM | POA: Insufficient documentation

## 2019-09-01 DIAGNOSIS — I5022 Chronic systolic (congestive) heart failure: Secondary | ICD-10-CM | POA: Insufficient documentation

## 2019-09-01 DIAGNOSIS — E785 Hyperlipidemia, unspecified: Secondary | ICD-10-CM | POA: Insufficient documentation

## 2019-09-01 DIAGNOSIS — I447 Left bundle-branch block, unspecified: Secondary | ICD-10-CM | POA: Insufficient documentation

## 2019-09-01 DIAGNOSIS — Z7901 Long term (current) use of anticoagulants: Secondary | ICD-10-CM | POA: Insufficient documentation

## 2019-09-01 DIAGNOSIS — I110 Hypertensive heart disease with heart failure: Secondary | ICD-10-CM | POA: Insufficient documentation

## 2019-09-01 DIAGNOSIS — Z7984 Long term (current) use of oral hypoglycemic drugs: Secondary | ICD-10-CM | POA: Insufficient documentation

## 2019-09-01 DIAGNOSIS — I2510 Atherosclerotic heart disease of native coronary artery without angina pectoris: Secondary | ICD-10-CM | POA: Insufficient documentation

## 2019-09-01 DIAGNOSIS — Z79899 Other long term (current) drug therapy: Secondary | ICD-10-CM | POA: Insufficient documentation

## 2019-09-01 DIAGNOSIS — Z8249 Family history of ischemic heart disease and other diseases of the circulatory system: Secondary | ICD-10-CM | POA: Insufficient documentation

## 2019-09-01 LAB — LIPID PANEL
Cholesterol: 142 mg/dL (ref 0–200)
HDL: 66 mg/dL (ref >40)
LDL Cholesterol: 64 mg/dL (ref 0–99)
Total CHOL/HDL Ratio: 2.2 RATIO
Triglycerides: 58 mg/dL (ref <150)
VLDL: 12 mg/dL (ref 0–40)

## 2019-09-01 LAB — BASIC METABOLIC PANEL
Anion gap: 12 (ref 5–15)
BUN: 12 mg/dL (ref 8–23)
CO2: 22 mmol/L (ref 22–32)
Calcium: 9.2 mg/dL (ref 8.9–10.3)
Chloride: 107 mmol/L (ref 98–111)
Creatinine, Ser: 0.73 mg/dL (ref 0.44–1.00)
GFR calc Af Amer: >60 mL/min (ref >60)
GFR calc non Af Amer: >60 mL/min (ref >60)
Glucose, Bld: 96 mg/dL (ref 70–99)
Potassium: 3.6 mmol/L (ref 3.5–5.1)
Sodium: 141 mmol/L (ref 135–145)

## 2019-09-01 LAB — DIGOXIN LEVEL: Digoxin Level: 0.6 ng/mL — ABNORMAL LOW (ref 0.8–2.0)

## 2019-09-01 MED ORDER — CARVEDILOL 12.5 MG PO TABS: 12.5000 mg | ORAL_TABLET | Freq: Two times a day (BID) | ORAL | 3 refills | Status: DC

## 2019-09-01 MED FILL — ENTRESTO 97 MG-103 MG TAB: 97-103 | 30 days supply | Qty: 60 | Fill #1

## 2019-09-01 MED FILL — CARVEDILOL 12.5 MG TABLET: 12.5 | 30 days supply | Qty: 60 | Fill #0

## 2019-09-01 NOTE — Progress Notes (Signed)
Paramedicine Encounter   Patient ID: Ann Bernard , female,   DOB: 1945/08/18,73 y.o.,  MRN: [id number]  Ms Blakeley was seen in the clinic with Dr Shirlee Latch. Per Dr Shirlee Latch we are increasing carvedilol to 12.5 mg BID. This change was made to her pillbox and I will follow up next week.   Jacqualine Code, EMT 09/01/2019   ACTION: Next visit planned for 1 week

## 2019-09-01 NOTE — Progress Notes (Signed)
PCP: Drosinis, Leonia Reader, PA-C HF Cardiology: Dr. Shirlee Latch  Ann Bernard is a 74 y.o. with history of HTN, hyperlipidemia, tobacco abuse, and chronic systolic heart failure. Of note, her brother has heart failure and has an ICD.   Admitted to [facility] in 10/20 with acute systolic heart failure. CTA negative for PE. ECHO was completed and showed severely reduced EF. Diagnostic cath was performed as noted below, mild coronary disease not likely to have caused cardiomyopathy.  Diuresed with IV lasix and transitioned to lasix 20 mg daily. Started on digoxin, carvedilol, and Entresto. Discharge weight 174 pounds.      Echo in 2/21 showed EF 20-25% with septal-lateral dyssynchrony.  Patient had St Jude CRT-D device placed.   She returns for followup of CHF.  She has been doing well. No significant exertional dyspnea.  No chest pain.  She has slept on 2 pillows for a long time.  No lightheadedness.   Labs (2/21): digoxin 0.5, K 3.8, creatinine 0.78  PMH: 1. Chronic systolic CHF: Nonischemic cardiomyopathy.  - Echo (10/20): EF < 20%, moderately dilated LV, severe global HK.  - RHC/LHC (10/20): D1 and D2 with 50% ostial stenosis.  PCWP 23, CI 2.34.  - CMRI (10/20): Moderately dilated LV with EF 13%, diffuse hypokinesis, septal-lateral dyssynchrony, normal RV size with EF 30%, nonspecific RV insertion site LGE.   - Echo (2/21): EF 20-25%, septal-lateral dyssynchrony, mild AI, mild-moderately decreased RV systolic function.  - St Jude CRT-D device placed.  2. Active smoker.  3. Hyperlipidemia 4. HTN 5. ABIs (2/21): Normal.   ROS: All systems negative except as listed in HPI, PMH and Problem List.  SH:  Social History   Socioeconomic History  . Marital status: Widowed    Spouse name: Not on file  . Number of children: Not on file  . Years of education: Not on file  . Highest education level: Not on file  Occupational History  . Not on file  Tobacco Use  . Smoking status: Current Some Day  Smoker  . Smokeless tobacco: Never Used  Substance and Sexual Activity  . Alcohol use: No  . Drug use: Never  . Sexual activity: Not Currently  Other Topics Concern  . Not on file  Social History Narrative  . Not on file   Social Determinants of Health   Financial Resource Strain: Low Risk   . Difficulty of Paying Living Expenses: Not very hard  Food Insecurity: No Food Insecurity  . Worried About Programme researcher, broadcasting/film/video in the Last Year: Never true  . Ran Out of Food in the Last Year: Never true  Transportation Needs: No Transportation Needs  . Lack of Transportation (Medical): No  . Lack of Transportation (Non-Medical): No  Physical Activity: Unknown  . Days of Exercise per Week: 0 days  . Minutes of Exercise per Session: Not on file  Stress: No Stress Concern Present  . Feeling of Stress : Not at all  Social Connections: Somewhat Isolated  . Frequency of Communication with Friends and Family: More than three times a week  . Frequency of Social Gatherings with Friends and Family: Once a week  . Attends Religious Services: More than 4 times per year  . Active Member of Clubs or Organizations: No  . Attends Banker Meetings: Never  . Marital Status: Widowed  Intimate Partner Violence: Not At Risk  . Fear of Current or Ex-Partner: No  . Emotionally Abused: No  . Physically Abused: No  .  Sexually Abused: No    FH:  Family History  Problem Relation Age of Onset  . Hypertension Mother   . Diabetes Mother   . Diabetes Brother   . Alcoholism Father   . Diabetes type I Grandson     Current Outpatient Medications  Medication Sig Dispense Refill  . alendronate (FOSAMAX) 70 MG tablet Take 70 mg by mouth once a week. Every Monday    . atorvastatin (LIPITOR) 40 MG tablet Take 1 tablet (40 mg total) by mouth daily at 6 PM. 90 tablet 3  . bisacodyl (DULCOLAX) 5 MG EC tablet Take 5 mg by mouth daily as needed for moderate constipation.    . dapagliflozin propanediol  (FARXIGA) 10 MG TABS tablet Take 10 mg by mouth daily before breakfast. 30 tablet 5  . digoxin (LANOXIN) 0.125 MG tablet Take 1 tablet (0.125 mg total) by mouth daily. 90 tablet 3  . diphenhydrAMINE (SOMINEX) 25 MG tablet Take 1 tablet by mouth daily as needed for itching.     . furosemide (LASIX) 20 MG tablet Take 1 tablet (20 mg total) by mouth as needed for fluid. (Patient not taking: Reported on 09/01/2019) 90 tablet 3  . Multiple Vitamins-Minerals (PRESERVISION AREDS 2 PO) Take 1 capsule by mouth daily.    . potassium chloride (KLOR-CON) 10 MEQ tablet TAKE 1 TABLET BY MOUTH WHEN YOU TAKE LASIX (Patient not taking: Reported on 09/01/2019) 90 tablet 1  . sacubitril-valsartan (ENTRESTO) 97-103 MG Take 1 tablet by mouth 2 (two) times daily. 60 tablet 11  . spironolactone (ALDACTONE) 25 MG tablet Take 1 tablet (25 mg total) by mouth daily. 90 tablet 3  . carvedilol (COREG) 12.5 MG tablet Take 1 tablet (12.5 mg total) by mouth 2 (two) times daily. 60 tablet 3   No current facility-administered medications for this encounter.    Vitals:   09/01/19 1055  BP: 132/74  Pulse: 61  SpO2: 100%  Weight: 75.6 kg (166 lb 9.6 oz)   Wt Readings from Last 3 Encounters:  09/01/19 75.6 kg (166 lb 9.6 oz)  08/27/19 74.1 kg (163 lb 6.4 oz)  08/11/19 73.5 kg (162 lb)   PHYSICAL EXAM: General: NAD Neck: No JVD, no thyromegaly or thyroid nodule.  Lungs: Clear to auscultation bilaterally with normal respiratory effort. CV: Nondisplaced PMI.  Heart regular S1/S2, no S3/S4, no murmur.  No peripheral edema.  No carotid bruit. Unable to palpate pedal pulses.  Abdomen: Soft, nontender, no hepatosplenomegaly, no distention.  Skin: Intact without lesions or rashes.  Neurologic: Alert and oriented x 3.  Psych: Normal affect. Extremities: No clubbing or cyanosis.  HEENT: Normal.   ASSESSMENT & PLAN: 1. Chronic systolic CHF: Nonischemic cardiomyopathy. Echo 10/20 with EF < 20%.  LHC/RHC 10/20 showed  nonobstructive coronary disease, CI 2.3.  Cardiac MRI (10/20) showed EF 13%, moderate LV dilation, septal-lateral dyssynchrony c/w LBBB and diffuse severe hypokinesis, normal RV size with moderate systolic dysfunction EF 80%, mild-moderate MR, moderate AI, LGE at inferior RV insertion site (nonspecific). Echo in 2/21 showed that EF remained 20-25%. She has a wide LBBB => St Jude CRT-D device placed.?Familial cardiomyopathy, brother with ICD but do not know why.  NYHA class II symptoms, not volume overloaded.  - Continue digoxin 0.125, check level today.  - Increase Coreg to 12.5 mg bid.  - Continue Entresto 49-51 mg twice a day. BMET today.    - Continue spironolactone to 25 mg daily.  - Continue Farxiga 10 mg daily.  2. CAD: Nonobstructive  CAD.  - Continue atorvastatin, check lipids.    3. Smoking: She has now quit.       Followup 4 wks with HF pharmacist for med titration, see me in 3 months with echo.   Marca Ancona 09/01/2019

## 2019-09-01 NOTE — Patient Instructions (Signed)
INCREASE Coreg to 12.5mg  (1 tab) twice a day   Labs today We will only contact you if something comes back abnormal or we need to make some changes. Otherwise no news is good news!    Your physician has requested that you have an echocardiogram. Echocardiography is a painless test that uses sound waves to create images of your heart. It provides your doctor with information about the size and shape of your heart and how well your heart's chambers and valves are working. This procedure takes approximately one hour. There are no restrictions for this procedure.   Your physician recommends that you schedule a follow-up appointment in: 1 month with the Pharmacist and 3 months for an echo and visit with Dr Shirlee Latch.   Please call office at [phone] option [phone] if you have any questions or concerns.    At the [facility], you and your health needs are our priority. As part of our continuing mission to provide you with exceptional heart care, we have created designated Provider Care Teams. These Care Teams include your primary Cardiologist (physician) and Advanced Practice Providers (APPs- Physician Assistants and Nurse Practitioners) who all work together to provide you with the care you need, when you need it.   You may see any of the following providers on your designated Care Team at your next follow up: Marland Kitchen Dr Arvilla Meres . Dr Marca Ancona . Tonye Becket, NP . Robbie Lis, PA . Karle Plumber, PharmD   Please be sure to bring in all your medications bottles to every appointment.

## 2019-09-07 ENCOUNTER — Other Ambulatory Visit: Payer: Self-pay

## 2019-09-07 ENCOUNTER — Ambulatory Visit
Admission: RE | Admit: 2019-09-07 | Discharge: 2019-09-07 | Disposition: A | Discharge status: Home / Self Care | Payer: Medicare HMO | Source: Ambulatory Visit | Attending: Cardiology | Admitting: Cardiology

## 2019-09-07 ENCOUNTER — Ambulatory Visit: Payer: Medicare HMO | Admitting: *Deleted

## 2019-09-07 DIAGNOSIS — I5022 Chronic systolic (congestive) heart failure: Secondary | ICD-10-CM

## 2019-09-07 DIAGNOSIS — I428 Other cardiomyopathies: Secondary | ICD-10-CM

## 2019-09-07 DIAGNOSIS — Z95810 Presence of automatic (implantable) cardiac defibrillator: Secondary | ICD-10-CM

## 2019-09-07 LAB — CUP PACEART INCLINIC DEVICE CHECK
Battery Remaining Longevity: 44 mo
Brady Statistic RA Percent Paced: 24 %
Brady Statistic RV Percent Paced: 99.67 %
Date Time Interrogation Session: 20210420133440
HighPow Impedance: 55 Ohm
Implantable Lead Implant Date: 20210409
Implantable Lead Implant Date: 20210409
Implantable Lead Implant Date: 20210409
Implantable Lead Location: 753858
Implantable Lead Location: 753859
Implantable Lead Location: 753860
Implantable Pulse Generator Implant Date: 20210409
Lead Channel Impedance Value: 387.5 Ohm
Lead Channel Impedance Value: 462.5 Ohm
Lead Channel Impedance Value: 512.5 Ohm
Lead Channel Pacing Threshold Amplitude: 0.75 V
Lead Channel Pacing Threshold Amplitude: 0.75 V
Lead Channel Pacing Threshold Amplitude: 2 V
Lead Channel Pacing Threshold Pulse Width: 0.5 ms
Lead Channel Pacing Threshold Pulse Width: 0.5 ms
Lead Channel Pacing Threshold Pulse Width: 1 ms
Lead Channel Sensing Intrinsic Amplitude: 1.5 mV
Lead Channel Sensing Intrinsic Amplitude: 11.9 mV
Lead Channel Setting Pacing Amplitude: 3.5 V
Lead Channel Setting Pacing Amplitude: 3.5 V
Lead Channel Setting Pacing Amplitude: 3.5 V
Lead Channel Setting Pacing Pulse Width: 0.5 ms
Lead Channel Setting Pacing Pulse Width: 1 ms
Lead Channel Setting Sensing Sensitivity: 0.5 mV
Pulse Gen Serial Number: 111006673

## 2019-09-07 NOTE — Patient Instructions (Signed)
Please go to [facility] at [facility] ([address], [address], [place], [place] [address]) for a chest X-ray to assess your ICD leads. You do not need an appointment.

## 2019-09-07 NOTE — Progress Notes (Signed)
Wound check appointment. Steri-strips removed. Wound without redness or edema. Incision edges approximated, wound well healed. Normal device function. RA and RV thresholds, sensing, and impedances consistent with implant measurements. LV threshold now 2.5V @ 0.16ms and 2.0V @ 1.22ms. Reviewed with Dr. Elberta Fortis, CXR ordered, LV PW increased to 1.39ms. Device programmed at 3.5V for extra safety margin until 3 month visit. Histogram distribution appropriate for patient and level of activity. BiV pacing >99%. No mode switches or ventricular arrhythmias noted. Patient educated about wound care, arm mobility, lifting restrictions, shock plan, and Merlin app. Merlin on 11/26/19 and ROV with Dr. Elberta Fortis in [facility] on 11/29/19.

## 2019-09-08 ENCOUNTER — Other Ambulatory Visit (HOSPITAL_COMMUNITY): Payer: Self-pay

## 2019-09-08 NOTE — Progress Notes (Signed)
Paramedicine Encounter    Patient ID: Ann Bernard, female    DOB: 04-Dec-1945, 74 y.o.   MRN: [id number]   Patient Care Team: Drosinis, Leonia Reader, PA-C as PCP - General (Internal Medicine) Regan Lemming, MD as PCP - Electrophysiology (Cardiology)  Patient Active Problem List   Diagnosis Date Noted  . Acute systolic HF (heart failure) (HCC)   . Precordial chest pain   . Acute coronary syndrome (HCC) 03/15/2019  . Acute CHF (congestive heart failure) (HCC) 03/14/2019  . DYSLIPIDEMIA 01/25/2009  . TOBACCO ABUSE 01/25/2009  . Essential hypertension 01/25/2009  . ECTOPIC PREGNANCY 01/25/2009  . UTERINE PROLAPSE 01/24/2009  . URINARY INCONTINENCE 01/24/2009    Current Outpatient Medications:  .  alendronate (FOSAMAX) 70 MG tablet, Take 70 mg by mouth once a week. Every Monday, Disp: , Rfl:  .  atorvastatin (LIPITOR) 40 MG tablet, Take 1 tablet (40 mg total) by mouth daily at 6 PM., Disp: 90 tablet, Rfl: 3 .  bisacodyl (DULCOLAX) 5 MG EC tablet, Take 5 mg by mouth daily as needed for moderate constipation., Disp: , Rfl:  .  carvedilol (COREG) 12.5 MG tablet, Take 1 tablet (12.5 mg total) by mouth 2 (two) times daily., Disp: 60 tablet, Rfl: 3 .  dapagliflozin propanediol (FARXIGA) 10 MG TABS tablet, Take 10 mg by mouth daily before breakfast., Disp: 30 tablet, Rfl: 5 .  digoxin (LANOXIN) 0.125 MG tablet, Take 1 tablet (0.125 mg total) by mouth daily., Disp: 90 tablet, Rfl: 3 .  diphenhydrAMINE (SOMINEX) 25 MG tablet, Take 1 tablet by mouth daily as needed for itching. , Disp: , Rfl:  .  sacubitril-valsartan (ENTRESTO) 97-103 MG, Take 1 tablet by mouth 2 (two) times daily., Disp: 60 tablet, Rfl: 11 .  spironolactone (ALDACTONE) 25 MG tablet, Take 1 tablet (25 mg total) by mouth daily., Disp: 90 tablet, Rfl: 3 .  furosemide (LASIX) 20 MG tablet, Take 1 tablet (20 mg total) by mouth as needed for fluid. (Patient not taking: Reported on 09/08/2019), Disp: 90 tablet, Rfl: 3 .  Multiple  Vitamins-Minerals (PRESERVISION AREDS 2 PO), Take 1 capsule by mouth daily., Disp: , Rfl:  .  potassium chloride (KLOR-CON) 10 MEQ tablet, TAKE 1 TABLET BY MOUTH WHEN YOU TAKE LASIX (Patient not taking: Reported on 09/08/2019), Disp: 90 tablet, Rfl: 1 Allergies  Allergen Reactions  . Aspirin Nausea Only and Other (See Comments)    GI Upset, stomach cramps       Social History   Socioeconomic History  . Marital status: Widowed    Spouse name: Not on file  . Number of children: Not on file  . Years of education: Not on file  . Highest education level: Not on file  Occupational History  . Not on file  Tobacco Use  . Smoking status: Current Some Day Smoker  . Smokeless tobacco: Never Used  Substance and Sexual Activity  . Alcohol use: No  . Drug use: Never  . Sexual activity: Not Currently  Other Topics Concern  . Not on file  Social History Narrative  . Not on file   Social Determinants of Health   Financial Resource Strain: Low Risk   . Difficulty of Paying Living Expenses: Not very hard  Food Insecurity: No Food Insecurity  . Worried About Programme researcher, broadcasting/film/video in the Last Year: Never true  . Ran Out of Food in the Last Year: Never true  Transportation Needs: No Transportation Needs  . Lack of Transportation (Medical): No  .  Lack of Transportation (Non-Medical): No  Physical Activity: Unknown  . Days of Exercise per Week: 0 days  . Minutes of Exercise per Session: Not on file  Stress: No Stress Concern Present  . Feeling of Stress : Not at all  Social Connections: Somewhat Isolated  . Frequency of Communication with Friends and Family: More than three times a week  . Frequency of Social Gatherings with Friends and Family: Once a week  . Attends Religious Services: More than 4 times per year  . Active Member of Clubs or Organizations: No  . Attends Archivist Meetings: Never  . Marital Status: Widowed  Intimate Partner Violence: Not At Risk  . Fear of  Current or Ex-Partner: No  . Emotionally Abused: No  . Physically Abused: No  . Sexually Abused: No    Physical Exam Cardiovascular:     Rate and Rhythm: Normal rate and regular rhythm.     Pulses: Normal pulses.  Pulmonary:     Effort: Pulmonary effort is normal.     Breath sounds: Normal breath sounds.  Abdominal:     General: Abdomen is flat. There is no distension.  Musculoskeletal:        General: Normal range of motion.     Right lower leg: No edema.     Left lower leg: No edema.  Skin:    General: Skin is warm and dry.     Capillary Refill: Capillary refill takes less than 2 seconds.  Neurological:     Mental Status: She is alert and oriented to person, place, and time.  Psychiatric:        Mood and Affect: Mood normal.         Future Appointments  Date Time Provider [place]  10/06/2019 11:00 AM [facility] PHARMACY [facility] None  11/26/2019  8:05 AM [facility] DEVICE REMOTES [facility]  11/29/2019  2:30 PM Camnitz, Ocie Doyne, MD [facility] None  12/02/2019 10:00 AM [facility] ECHO OP 1 [facility]  12/02/2019 11:00 AM Larey Dresser, MD [facility] None  02/25/2020  8:05 AM [facility] DEVICE REMOTES [facility]  05/26/2020  8:05 AM [facility] DEVICE REMOTES [facility]  08/25/2020  8:05 AM [facility] DEVICE REMOTES [facility]  11/24/2020  8:05 AM [facility] DEVICE REMOTES [facility]    BP (!) 150/80 (BP Location: Left Arm, Patient Position: Sitting, Cuff Size: Normal)   Pulse 64   Resp 16   Wt 165 lb (74.8 kg)   SpO2 98%   BMI 25.84 kg/m   Weight yesterday- 166 lb Last visit weight- 166.6 lb  Ms Mcshea was seen at home today and reported feeling well. She denied chest pain, SOB, headache, dizziness, orthopnea, fever or cough over the past week. She stated she has been compliant with her medications and her weight has been stable. Her medications were verified and her pillbox was refilled. I  will follow up next week.   Jacquiline Doe, EMT 09/08/19  ACTION: Home visit completed Next visit planned for 1 week

## 2019-09-09 ENCOUNTER — Other Ambulatory Visit (HOSPITAL_COMMUNITY): Payer: Self-pay | Admitting: Adult Health

## 2019-09-15 ENCOUNTER — Telehealth: Payer: Self-pay | Admitting: *Deleted

## 2019-09-15 ENCOUNTER — Other Ambulatory Visit (HOSPITAL_COMMUNITY): Payer: Self-pay

## 2019-09-15 NOTE — Telephone Encounter (Signed)
Spoke with patient to advise of stable CXR results. Pt verbalizes understanding and denies questions or concerns at this time.

## 2019-09-15 NOTE — Telephone Encounter (Signed)
-----   Message from Will Jorja Loa, MD sent at 09/15/2019 12:30 PM EDT ----- CXR unchanged from prior. Recheck at 3 month visit.

## 2019-09-15 NOTE — Progress Notes (Signed)
Paramedicine Encounter    Patient ID: Ann Bernard, female    DOB: 1946/04/14, 74 y.o.   MRN: [id number]   Patient Care Team: Drosinis, Leonia Reader, PA-C as PCP - General (Internal Medicine) Regan Lemming, MD as PCP - Electrophysiology (Cardiology)  Patient Active Problem List   Diagnosis Date Noted  . Acute systolic HF (heart failure) (HCC)   . Precordial chest pain   . Acute coronary syndrome (HCC) 03/15/2019  . Acute CHF (congestive heart failure) (HCC) 03/14/2019  . DYSLIPIDEMIA 01/25/2009  . TOBACCO ABUSE 01/25/2009  . Essential hypertension 01/25/2009  . ECTOPIC PREGNANCY 01/25/2009  . UTERINE PROLAPSE 01/24/2009  . URINARY INCONTINENCE 01/24/2009    Current Outpatient Medications:  .  alendronate (FOSAMAX) 70 MG tablet, Take 70 mg by mouth once a week. Every Monday, Disp: , Rfl:  .  atorvastatin (LIPITOR) 40 MG tablet, Take 1 tablet (40 mg total) by mouth daily at 6 PM., Disp: 90 tablet, Rfl: 3 .  bisacodyl (DULCOLAX) 5 MG EC tablet, Take 5 mg by mouth daily as needed for moderate constipation., Disp: , Rfl:  .  carvedilol (COREG) 12.5 MG tablet, Take 1 tablet (12.5 mg total) by mouth 2 (two) times daily., Disp: 60 tablet, Rfl: 3 .  dapagliflozin propanediol (FARXIGA) 10 MG TABS tablet, Take 10 mg by mouth daily before breakfast., Disp: 30 tablet, Rfl: 5 .  digoxin (LANOXIN) 0.125 MG tablet, Take 1 tablet (0.125 mg total) by mouth daily., Disp: 90 tablet, Rfl: 3 .  diphenhydrAMINE (SOMINEX) 25 MG tablet, Take 1 tablet by mouth daily as needed for itching. , Disp: , Rfl:  .  Multiple Vitamins-Minerals (PRESERVISION AREDS 2 PO), Take 1 capsule by mouth daily., Disp: , Rfl:  .  sacubitril-valsartan (ENTRESTO) 97-103 MG, Take 1 tablet by mouth 2 (two) times daily., Disp: 60 tablet, Rfl: 11 .  spironolactone (ALDACTONE) 25 MG tablet, Take 1 tablet (25 mg total) by mouth daily., Disp: 90 tablet, Rfl: 3 .  furosemide (LASIX) 20 MG tablet, Take 1 tablet (20 mg total) by mouth as  needed for fluid. (Patient not taking: Reported on 09/08/2019), Disp: 90 tablet, Rfl: 3 .  potassium chloride (KLOR-CON) 10 MEQ tablet, TAKE 1 TABLET BY MOUTH WHEN YOU TAKE LASIX (Patient not taking: Reported on 09/15/2019), Disp: 90 tablet, Rfl: 1 Allergies  Allergen Reactions  . Aspirin Nausea Only and Other (See Comments)    GI Upset, stomach cramps       Social History   Socioeconomic History  . Marital status: Widowed    Spouse name: Not on file  . Number of children: Not on file  . Years of education: Not on file  . Highest education level: Not on file  Occupational History  . Not on file  Tobacco Use  . Smoking status: Current Some Day Smoker  . Smokeless tobacco: Never Used  Substance and Sexual Activity  . Alcohol use: No  . Drug use: Never  . Sexual activity: Not Currently  Other Topics Concern  . Not on file  Social History Narrative  . Not on file   Social Determinants of Health   Financial Resource Strain: Low Risk   . Difficulty of Paying Living Expenses: Not very hard  Food Insecurity: No Food Insecurity  . Worried About Programme researcher, broadcasting/film/video in the Last Year: Never true  . Ran Out of Food in the Last Year: Never true  Transportation Needs: No Transportation Needs  . Lack of Transportation (Medical): No  .  Lack of Transportation (Non-Medical): No  Physical Activity: Unknown  . Days of Exercise per Week: 0 days  . Minutes of Exercise per Session: Not on file  Stress: No Stress Concern Present  . Feeling of Stress : Not at all  Social Connections: Somewhat Isolated  . Frequency of Communication with Friends and Family: More than three times a week  . Frequency of Social Gatherings with Friends and Family: Once a week  . Attends Religious Services: More than 4 times per year  . Active Member of Clubs or Organizations: No  . Attends Archivist Meetings: Never  . Marital Status: Widowed  Intimate Partner Violence: Not At Risk  . Fear of Current  or Ex-Partner: No  . Emotionally Abused: No  . Physically Abused: No  . Sexually Abused: No    Physical Exam Cardiovascular:     Rate and Rhythm: Normal rate and regular rhythm.     Pulses: Normal pulses.  Pulmonary:     Effort: Pulmonary effort is normal.     Breath sounds: Normal breath sounds.  Musculoskeletal:        General: Normal range of motion.     Right lower leg: No edema.     Left lower leg: No edema.  Skin:    General: Skin is warm and dry.     Capillary Refill: Capillary refill takes less than 2 seconds.  Neurological:     Mental Status: She is alert and oriented to person, place, and time.  Psychiatric:        Mood and Affect: Mood normal.         Future Appointments  Date Time Provider [place]  10/06/2019 11:00 AM [facility] PHARMACY [facility] None  11/26/2019  8:05 AM [facility] DEVICE REMOTES [facility]  11/29/2019  2:30 PM Camnitz, Ocie Doyne, MD [facility] None  12/02/2019 10:00 AM MC ECHO OP 1 [facility]  12/02/2019 11:00 AM Larey Dresser, MD [facility] None  02/25/2020  8:05 AM [facility] DEVICE REMOTES [facility]  05/26/2020  8:05 AM [facility] DEVICE REMOTES [facility]  08/25/2020  8:05 AM [facility] DEVICE REMOTES [facility]  11/24/2020  8:05 AM [facility] DEVICE REMOTES [facility]    BP 112/64 (BP Location: Left Arm, Patient Position: Sitting, Cuff Size: Normal)   Pulse 60   Resp 16   Wt 161 lb 12.8 oz (73.4 kg)   SpO2 98%   BMI 25.34 kg/m   Weight yesterday- 161.8 lb Last visit weight- 165 lb  [name] was seen at home today and reported feeling well. She denied chest pain, SOB, headache, dizziness, orthopnea, fever or cough since our last visit. She did complain of having a day of increased fatigue but I advised this could have been from the increased Coreg dose. She advised she is now feeling completely normal and was not concerned. Her medications were verified  and he pillbox was checked for accuracy. I will follow up in two weeks.   Ann Bernard, EMT 09/15/19  ACTION: Home visit completed Next visit planned for 2 weeks

## 2019-09-22 MED FILL — FARXIGA 10 MG TABLET: 10 | 30 days supply | Qty: 30 | Fill #2

## 2019-09-29 ENCOUNTER — Other Ambulatory Visit (HOSPITAL_COMMUNITY): Payer: Self-pay

## 2019-09-29 NOTE — Progress Notes (Signed)
Paramedicine Encounter    Patient ID: Ann Bernard, female    DOB: 1946/04/14, 74 y.o.   MRN: [id number]   Patient Care Team: Drosinis, Leonia Reader, PA-C as PCP - General (Internal Medicine) Regan Lemming, MD as PCP - Electrophysiology (Cardiology)  Patient Active Problem List   Diagnosis Date Noted  . Acute systolic HF (heart failure) (HCC)   . Precordial chest pain   . Acute coronary syndrome (HCC) 03/15/2019  . Acute CHF (congestive heart failure) (HCC) 03/14/2019  . DYSLIPIDEMIA 01/25/2009  . TOBACCO ABUSE 01/25/2009  . Essential hypertension 01/25/2009  . ECTOPIC PREGNANCY 01/25/2009  . UTERINE PROLAPSE 01/24/2009  . URINARY INCONTINENCE 01/24/2009    Current Outpatient Medications:  .  alendronate (FOSAMAX) 70 MG tablet, Take 70 mg by mouth once a week. Every Monday, Disp: , Rfl:  .  atorvastatin (LIPITOR) 40 MG tablet, Take 1 tablet (40 mg total) by mouth daily at 6 PM., Disp: 90 tablet, Rfl: 3 .  bisacodyl (DULCOLAX) 5 MG EC tablet, Take 5 mg by mouth daily as needed for moderate constipation., Disp: , Rfl:  .  carvedilol (COREG) 12.5 MG tablet, Take 1 tablet (12.5 mg total) by mouth 2 (two) times daily., Disp: 60 tablet, Rfl: 3 .  dapagliflozin propanediol (FARXIGA) 10 MG TABS tablet, Take 10 mg by mouth daily before breakfast., Disp: 30 tablet, Rfl: 5 .  digoxin (LANOXIN) 0.125 MG tablet, Take 1 tablet (0.125 mg total) by mouth daily., Disp: 90 tablet, Rfl: 3 .  diphenhydrAMINE (SOMINEX) 25 MG tablet, Take 1 tablet by mouth daily as needed for itching. , Disp: , Rfl:  .  Multiple Vitamins-Minerals (PRESERVISION AREDS 2 PO), Take 1 capsule by mouth daily., Disp: , Rfl:  .  sacubitril-valsartan (ENTRESTO) 97-103 MG, Take 1 tablet by mouth 2 (two) times daily., Disp: 60 tablet, Rfl: 11 .  spironolactone (ALDACTONE) 25 MG tablet, Take 1 tablet (25 mg total) by mouth daily., Disp: 90 tablet, Rfl: 3 .  furosemide (LASIX) 20 MG tablet, Take 1 tablet (20 mg total) by mouth as  needed for fluid. (Patient not taking: Reported on 09/08/2019), Disp: 90 tablet, Rfl: 3 .  potassium chloride (KLOR-CON) 10 MEQ tablet, TAKE 1 TABLET BY MOUTH WHEN YOU TAKE LASIX (Patient not taking: Reported on 09/15/2019), Disp: 90 tablet, Rfl: 1 Allergies  Allergen Reactions  . Aspirin Nausea Only and Other (See Comments)    GI Upset, stomach cramps       Social History   Socioeconomic History  . Marital status: Widowed    Spouse name: Not on file  . Number of children: Not on file  . Years of education: Not on file  . Highest education level: Not on file  Occupational History  . Not on file  Tobacco Use  . Smoking status: Current Some Day Smoker  . Smokeless tobacco: Never Used  Substance and Sexual Activity  . Alcohol use: No  . Drug use: Never  . Sexual activity: Not Currently  Other Topics Concern  . Not on file  Social History Narrative  . Not on file   Social Determinants of Health   Financial Resource Strain: Low Risk   . Difficulty of Paying Living Expenses: Not very hard  Food Insecurity: No Food Insecurity  . Worried About Programme researcher, broadcasting/film/video in the Last Year: Never true  . Ran Out of Food in the Last Year: Never true  Transportation Needs: No Transportation Needs  . Lack of Transportation (Medical): No  .  Lack of Transportation (Non-Medical): No  Physical Activity: Unknown  . Days of Exercise per Week: 0 days  . Minutes of Exercise per Session: Not on file  Stress: No Stress Concern Present  . Feeling of Stress : Not at all  Social Connections: Somewhat Isolated  . Frequency of Communication with Friends and Family: More than three times a week  . Frequency of Social Gatherings with Friends and Family: Once a week  . Attends Religious Services: More than 4 times per year  . Active Member of Clubs or Organizations: No  . Attends Banker Meetings: Never  . Marital Status: Widowed  Intimate Partner Violence: Not At Risk  . Fear of Current  or Ex-Partner: No  . Emotionally Abused: No  . Physically Abused: No  . Sexually Abused: No    Physical Exam Cardiovascular:     Rate and Rhythm: Normal rate and regular rhythm.     Pulses: Normal pulses.  Pulmonary:     Effort: Pulmonary effort is normal.     Breath sounds: Normal breath sounds.  Musculoskeletal:     Right lower leg: No edema.     Left lower leg: No edema.  Skin:    General: Skin is warm and dry.     Capillary Refill: Capillary refill takes less than 2 seconds.  Neurological:     Mental Status: She is alert and oriented to person, place, and time.         Future Appointments  Date Time Provider Department Center  10/06/2019 11:00 AM [facility] PHARMACY [facility] None  11/26/2019  8:05 AM [facility] DEVICE REMOTES [facility]  11/29/2019  2:30 PM Camnitz, Andree Coss, MD [facility] None  12/02/2019 10:00 AM [facility] ECHO OP 1 [facility]  12/02/2019 11:00 AM Laurey Morale, MD [facility] None  02/25/2020  8:05 AM [facility] DEVICE REMOTES [facility]  05/26/2020  8:05 AM [facility] DEVICE REMOTES [facility]  08/25/2020  8:05 AM [facility] DEVICE REMOTES [facility]  11/24/2020  8:05 AM [facility] DEVICE REMOTES [facility]    BP (!) 150/76 (BP Location: Left Arm, Patient Position: Sitting, Cuff Size: Normal)   Pulse 95   Resp 16   Wt 162 lb 9.6 oz (73.8 kg)   SpO2 98%   BMI 25.47 kg/m   Weight yesterday- 162.6 lb  Last visit weight- 161.8 lb  Ms Sonnen was seen at home today and reported feeling well. She denied chest pain, SOB, headache, dizziness, orthopnea, fever or cough since our last visit. She reported being compliant with her medication however she had a problem differentiating between carvedilol and atorvastatin and had accidentally put atorvastatin in her box twice and carvedilol once. I pointed this out and fixed it for her. I explained how to tell the difference between the two pills when  they are out of the bottle and she was understanding. I will follow up in two weeks.  Jacqualine Code, EMT 09/29/19  ACTION: Home visit completed Next visit planned for 2 weeks

## 2019-10-06 ENCOUNTER — Encounter (HOSPITAL_COMMUNITY): Payer: Self-pay

## 2019-10-06 ENCOUNTER — Other Ambulatory Visit: Payer: Self-pay

## 2019-10-06 ENCOUNTER — Ambulatory Visit (HOSPITAL_COMMUNITY)
Admission: RE | Admit: 2019-10-06 | Discharge: 2019-10-06 | Disposition: A | Discharge status: Home / Self Care | Payer: Medicare HMO | Source: Ambulatory Visit | Attending: Internal Medicine | Admitting: Internal Medicine

## 2019-10-06 VITALS — BP 124/70 | HR 61 | Ht 67.0 in | Wt 163.6 lb

## 2019-10-06 DIAGNOSIS — Z87891 Personal history of nicotine dependence: Secondary | ICD-10-CM | POA: Insufficient documentation

## 2019-10-06 DIAGNOSIS — I428 Other cardiomyopathies: Secondary | ICD-10-CM | POA: Diagnosis not present

## 2019-10-06 DIAGNOSIS — I2510 Atherosclerotic heart disease of native coronary artery without angina pectoris: Secondary | ICD-10-CM | POA: Diagnosis not present

## 2019-10-06 DIAGNOSIS — Z7901 Long term (current) use of anticoagulants: Secondary | ICD-10-CM | POA: Diagnosis not present

## 2019-10-06 DIAGNOSIS — I110 Hypertensive heart disease with heart failure: Secondary | ICD-10-CM | POA: Diagnosis not present

## 2019-10-06 DIAGNOSIS — I5022 Chronic systolic (congestive) heart failure: Secondary | ICD-10-CM | POA: Diagnosis present

## 2019-10-06 DIAGNOSIS — Z79899 Other long term (current) drug therapy: Secondary | ICD-10-CM | POA: Insufficient documentation

## 2019-10-06 MED ORDER — BIDIL 20-37.5 MG PO TABS: 0.5000 | ORAL_TABLET | Freq: Three times a day (TID) | ORAL | 11 refills | Status: DC

## 2019-10-06 MED FILL — BIDIL 20-37.5 MG TABS: 20-37.5 | 30 days supply | Qty: 45 | Fill #0

## 2019-10-06 MED FILL — ATORVASTATIN CALCIUM 40 MG: 40 | 90 days supply | Qty: 90 | Fill #1

## 2019-10-06 MED FILL — CARVEDILOL 12.5 MG TABLET: 12.5 | 30 days supply | Qty: 60 | Fill #1

## 2019-10-06 MED FILL — ENTRESTO 97 MG-103 MG TAB: 97-103 | 30 days supply | Qty: 60 | Fill #2

## 2019-10-06 NOTE — Progress Notes (Signed)
PCP: Drosinis, Leonia Reader, PA-C HF Cardiology: Dr. Shirlee Latch  HPI:  Ms Blackwelder is a 74 y.o. with history of HTN, hyperlipidemia, tobacco abuse, and chronic systolic heart failure.Of note, her brother has heart failure and has an ICD.   Admitted to [facility] in 10/20 with acute systolic heart failure. CTA negative for PE. ECHO was completed and showed severely reduced EF. Diagnostic cath was performed as noted below, mild coronary disease not likely to have caused cardiomyopathy. Diuresed with IV lasix and transitioned to lasix 20 mg daily. Started on digoxin, carvedilol, and Entresto. Discharge weight 174 pounds.      Echo in 2/21 showed EF 20-25% with septal-lateral dyssynchrony. Patient had St Jude CRT-D device placed on 08/27/19.  At last heart failure clinic visit with Dr. Shirlee Latch, she had been doing well. No significant exertional dyspnea. No chest pain. She has slept on 2 pillows for a long time. No lightheadedness.   Today she returns to HF clinic for pharmacist medication titration. At last visit with MD, carvedilol was increased to 12.5 mg BID. Symptomatically, she is doing well. She denies dizziness, lightheadedness, and fatigue. No chest pain or palpitations. She only gets SOB when she does moderate exercise or has to walk up stairs. She is able to complete all ADLs without issues. She checks her weight at home (normal range 161-163 lbs). She has not needed to take furosemide for edema in several months. No LEE or PND/Orthopnea. Her appetite has been "too good" lately. She adheres to a low-salt diet.   HF Medications: Carvedilol 12.5 mg BID Entresto 97-103 mg BID  Spironolactone 25 mg daily Dapagliflozin 10 mg daily Digoxin 0.125 mg daily Furosemide 20 mg daily PRN for fluid retention Potassium chloride 10 mEq PRN if taking furosemide  Has the patient been experiencing any side effects to the medications prescribed?  no  Does the patient have any problems obtaining medications due to  transportation or finances?   no, she has [employer] with LIS  Understanding of regimen: fair Understanding of indications: fair Potential of compliance: fair Patient understands to avoid NSAIDs. Patient understands to avoid decongestants.   Pertinent Lab Values (09/01/19): Marland Kitchen Serum creatinine 0.73, BUN 12, Potassium 3.6, Sodium 141, Digoxin 0.6 ng/mL   Vital Signs: . Weight: 163.6 lbs (last clinic weight: 162 lbs) . Blood pressure: 124/70  . Heart rate: 61   Assessment: 1. Chronic systolic CHF: Nonischemic cardiomyopathy. Echo 10/20 with EF <20%. LHC/RHC 10/20 showed nonobstructive coronary disease, CI 2.3. Cardiac MRI (10/20) showed EF 13%, moderate LV dilation, septal-lateral dyssynchrony c/w LBBB and diffuse severe hypokinesis, normal RV size with moderate systolic dysfunction EF 30%, mild-moderate MR, moderate AI, LGE at inferior RV insertion site (nonspecific). Echo in 2/21 showed that EF remained 20-25%. She has a wide LBBB => St Jude CRT-D device placed.?Familial cardiomyopathy, brother with ICD but do not know why - NYHA class II symptoms, not volume overloaded on exam - Continue furosemide 20 mg daily PRN. Has not needed in several months - Continue carvedilol 12.5 mg BID - Continue Entresto 97-103 mg BID - Continue spironolactone 25 mg daily - Continue dapagliflozin 10 mg daily - Initiate BiDil 20-37.5 mg - take one-half tablet TID - Continue digoxin 0.125 mg daily. Last level 0.6 ng/mL on 09/01/19 -Has paramedicine to help with medications  2. CAD: Nonobstructive CAD - Continue atorvastatin 40 mg daily (LDL 64)     3. Smoking: She has now quit     Plan: 1) Medication changes: Based  on clinical presentation, vital signs and recent labs will initiate BiDil 20-37.5 mg - take one-half tablet TID 2) Follow-up: 4 weeks with pharmacy & 8 weeks with Dr. Ephriam Jenkins, PharmD PGY1 Ambulatory Care Pharmacy Resident  Audry Riles, PharmD, BCPS, [facility],  CPP Heart Failure Clinic Pharmacist [phone]

## 2019-10-06 NOTE — Patient Instructions (Signed)
It was a pleasure seeing you today!  MEDICATIONS: -We are changing your medications today -Start BiDil 20-37.5mg  half a tablet by mouth three times daily. Start this medication next week when paramedicine comes to see you. They will help you set this medication up with your new pill box. -Please call [facility] to get refills on all your medications that you are out of. -Call if you have questions about your medications.  LABS: -We will call you if your labs need attention.  NEXT APPOINTMENT: Return to clinic in 4 weeks with pharmacy.  In general, to take care of your heart failure: -Limit your fluid intake to 2 Liters (half-gallon) per day.   -Limit your salt intake to ideally 2-3 grams (2000-3000 mg) per day. -Weigh yourself daily and record, and bring that "weight diary" to your next appointment.  (Weight gain of 2-3 pounds in 1 day typically means fluid weight.) -The medications for your heart are to help your heart and help you live longer.   -Please contact us before stopping any of your heart medications.  Call the clinic at [phone] with questions or to reschedule future appointments.

## 2019-10-13 ENCOUNTER — Other Ambulatory Visit (HOSPITAL_COMMUNITY): Payer: Self-pay

## 2019-10-13 NOTE — Progress Notes (Signed)
Paramedicine Encounter    Patient ID: Ann Bernard, female    DOB: November 01, 1945, 74 y.o.   MRN: [id number]   Patient Care Team: Drosinis, Leonia Reader, PA-C as PCP - General (Internal Medicine) Regan Lemming, MD as PCP - Electrophysiology (Cardiology)  Patient Active Problem List   Diagnosis Date Noted  . Acute systolic HF (heart failure) (HCC)   . Precordial chest pain   . Acute coronary syndrome (HCC) 03/15/2019  . Acute CHF (congestive heart failure) (HCC) 03/14/2019  . DYSLIPIDEMIA 01/25/2009  . TOBACCO ABUSE 01/25/2009  . Essential hypertension 01/25/2009  . ECTOPIC PREGNANCY 01/25/2009  . UTERINE PROLAPSE 01/24/2009  . URINARY INCONTINENCE 01/24/2009    Current Outpatient Medications:  .  alendronate (FOSAMAX) 70 MG tablet, Take 70 mg by mouth once a week. Every Monday, Disp: , Rfl:  .  atorvastatin (LIPITOR) 40 MG tablet, Take 1 tablet (40 mg total) by mouth daily at 6 PM., Disp: 90 tablet, Rfl: 3 .  bisacodyl (DULCOLAX) 5 MG EC tablet, Take 5 mg by mouth daily as needed for moderate constipation., Disp: , Rfl:  .  carvedilol (COREG) 12.5 MG tablet, Take 1 tablet (12.5 mg total) by mouth 2 (two) times daily., Disp: 60 tablet, Rfl: 3 .  dapagliflozin propanediol (FARXIGA) 10 MG TABS tablet, Take 10 mg by mouth daily before breakfast., Disp: 30 tablet, Rfl: 5 .  digoxin (LANOXIN) 0.125 MG tablet, Take 1 tablet (0.125 mg total) by mouth daily., Disp: 90 tablet, Rfl: 3 .  diphenhydrAMINE (SOMINEX) 25 MG tablet, Take 1 tablet by mouth daily as needed for itching. , Disp: , Rfl:  .  isosorbide-hydrALAZINE (BIDIL) 20-37.5 MG tablet, Take 0.5 tablets by mouth 3 (three) times daily., Disp: 45 tablet, Rfl: 11 .  Multiple Vitamins-Minerals (PRESERVISION AREDS 2 PO), Take 1 capsule by mouth daily., Disp: , Rfl:  .  sacubitril-valsartan (ENTRESTO) 97-103 MG, Take 1 tablet by mouth 2 (two) times daily., Disp: 60 tablet, Rfl: 11 .  spironolactone (ALDACTONE) 25 MG tablet, Take 1 tablet (25  mg total) by mouth daily., Disp: 90 tablet, Rfl: 3 .  furosemide (LASIX) 20 MG tablet, Take 1 tablet (20 mg total) by mouth as needed for fluid. (Patient not taking: Reported on 10/06/2019), Disp: 90 tablet, Rfl: 3 .  potassium chloride (KLOR-CON) 10 MEQ tablet, TAKE 1 TABLET BY MOUTH WHEN YOU TAKE LASIX (Patient not taking: Reported on 09/15/2019), Disp: 90 tablet, Rfl: 1 Allergies  Allergen Reactions  . Aspirin Nausea Only and Other (See Comments)    GI Upset, stomach cramps       Social History   Socioeconomic History  . Marital status: Widowed    Spouse name: Not on file  . Number of children: Not on file  . Years of education: Not on file  . Highest education level: Not on file  Occupational History  . Not on file  Tobacco Use  . Smoking status: Current Some Day Smoker  . Smokeless tobacco: Never Used  Substance and Sexual Activity  . Alcohol use: No  . Drug use: Never  . Sexual activity: Not Currently  Other Topics Concern  . Not on file  Social History Narrative  . Not on file   Social Determinants of Health   Financial Resource Strain: Low Risk   . Difficulty of Paying Living Expenses: Not very hard  Food Insecurity: No Food Insecurity  . Worried About Programme researcher, broadcasting/film/video in the Last Year: Never true  . Ran Out of  Food in the Last Year: Never true  Transportation Needs: No Transportation Needs  . Lack of Transportation (Medical): No  . Lack of Transportation (Non-Medical): No  Physical Activity: Unknown  . Days of Exercise per Week: 0 days  . Minutes of Exercise per Session: Not on file  Stress: No Stress Concern Present  . Feeling of Stress : Not at all  Social Connections: Somewhat Isolated  . Frequency of Communication with Friends and Family: More than three times a week  . Frequency of Social Gatherings with Friends and Family: Once a week  . Attends Religious Services: More than 4 times per year  . Active Member of Clubs or Organizations: No  .  Attends Archivist Meetings: Never  . Marital Status: Widowed  Intimate Partner Violence: Not At Risk  . Fear of Current or Ex-Partner: No  . Emotionally Abused: No  . Physically Abused: No  . Sexually Abused: No    Physical Exam Cardiovascular:     Rate and Rhythm: Normal rate and regular rhythm.     Pulses: Normal pulses.  Pulmonary:     Effort: Pulmonary effort is normal.     Breath sounds: Normal breath sounds.  Musculoskeletal:        General: Normal range of motion.     Right lower leg: No edema.     Left lower leg: No edema.  Skin:    General: Skin is warm and dry.     Capillary Refill: Capillary refill takes less than 2 seconds.  Neurological:     Mental Status: She is alert and oriented to person, place, and time.  Psychiatric:        Mood and Affect: Mood normal.         Future Appointments  Date Time Provider [place]  11/03/2019 11:00 AM [facility] PHARMACY [facility] None  11/26/2019  8:05 AM [facility] DEVICE REMOTES [facility]  11/29/2019  2:30 PM Camnitz, Ocie Doyne, MD [facility] None  12/02/2019 10:00 AM [facility] ECHO OP 1 [facility]  12/02/2019 11:00 AM Larey Dresser, MD [facility] None  02/25/2020  8:05 AM [facility] DEVICE REMOTES [facility]  05/26/2020  8:05 AM [facility] DEVICE REMOTES [facility]  08/25/2020  8:05 AM [facility] DEVICE REMOTES [facility]  11/24/2020  8:05 AM [facility] DEVICE REMOTES [facility]    BP 131/80 (BP Location: Left Arm, Patient Position: Sitting, Cuff Size: Normal)   Pulse 60   Resp 16   Wt 162 lb (73.5 kg)   SpO2 100%   BMI 25.37 kg/m   Weight yesterday- 162 lb Last visit weight- 163 lb  [name] was seen at home today and rpeorted feeling well. She denied chest pain, SOB, headache, dizziness, orthopnea, fever or cough since our last visit. She stated she has been compliant with her medications over the past week and her weight has  been stable. Her medications were verified and her pillbox was refilled. I will follow up next week due to the addition of Bidil today.   Jacquiline Doe, EMT 10/13/19  ACTION: Home visit completed Next visit planned for 1 week

## 2019-10-20 ENCOUNTER — Other Ambulatory Visit (HOSPITAL_COMMUNITY): Payer: Self-pay

## 2019-10-20 NOTE — Progress Notes (Signed)
Paramedicine Encounter    Patient ID: Ann Bernard, female    DOB: 1945-12-27, 74 y.o.   MRN: [id number]   Patient Care Team: Ann Bernard, Ann Reader, PA-C as PCP - General (Internal Medicine) Ann Lemming, MD as PCP - Electrophysiology (Cardiology)  Patient Active Problem List   Diagnosis Date Noted  . Acute systolic HF (heart failure) (HCC)   . Precordial chest pain   . Acute coronary syndrome (HCC) 03/15/2019  . Acute CHF (congestive heart failure) (HCC) 03/14/2019  . DYSLIPIDEMIA 01/25/2009  . TOBACCO ABUSE 01/25/2009  . Essential hypertension 01/25/2009  . ECTOPIC PREGNANCY 01/25/2009  . UTERINE PROLAPSE 01/24/2009  . URINARY INCONTINENCE 01/24/2009    Current Outpatient Medications:  .  alendronate (FOSAMAX) 70 MG tablet, Take 70 mg by mouth once a week. Every Monday, Disp: , Rfl:  .  atorvastatin (LIPITOR) 40 MG tablet, Take 1 tablet (40 mg total) by mouth daily at 6 PM., Disp: 90 tablet, Rfl: 3 .  bisacodyl (DULCOLAX) 5 MG EC tablet, Take 5 mg by mouth daily as needed for moderate constipation., Disp: , Rfl:  .  carvedilol (COREG) 12.5 MG tablet, Take 1 tablet (12.5 mg total) by mouth 2 (two) times daily., Disp: 60 tablet, Rfl: 3 .  dapagliflozin propanediol (FARXIGA) 10 MG TABS tablet, Take 10 mg by mouth daily before breakfast., Disp: 30 tablet, Rfl: 5 .  digoxin (LANOXIN) 0.125 MG tablet, Take 1 tablet (0.125 mg total) by mouth daily., Disp: 90 tablet, Rfl: 3 .  diphenhydrAMINE (SOMINEX) 25 MG tablet, Take 1 tablet by mouth daily as needed for itching. , Disp: , Rfl:  .  isosorbide-hydrALAZINE (BIDIL) 20-37.5 MG tablet, Take 0.5 tablets by mouth 3 (three) times daily., Disp: 45 tablet, Rfl: 11 .  Multiple Vitamins-Minerals (PRESERVISION AREDS 2 PO), Take 1 capsule by mouth daily., Disp: , Rfl:  .  sacubitril-valsartan (ENTRESTO) 97-103 MG, Take 1 tablet by mouth 2 (two) times daily., Disp: 60 tablet, Rfl: 11 .  spironolactone (ALDACTONE) 25 MG tablet, Take 1 tablet (25  mg total) by mouth daily., Disp: 90 tablet, Rfl: 3 .  furosemide (LASIX) 20 MG tablet, Take 1 tablet (20 mg total) by mouth as needed for fluid. (Patient not taking: Reported on 10/06/2019), Disp: 90 tablet, Rfl: 3 .  potassium chloride (KLOR-CON) 10 MEQ tablet, TAKE 1 TABLET BY MOUTH WHEN YOU TAKE LASIX (Patient not taking: Reported on 09/15/2019), Disp: 90 tablet, Rfl: 1 Allergies  Allergen Reactions  . Aspirin Nausea Only and Other (See Comments)    GI Upset, stomach cramps       Social History   Socioeconomic History  . Marital status: Widowed    Spouse name: Not on file  . Number of children: Not on file  . Years of education: Not on file  . Highest education level: Not on file  Occupational History  . Not on file  Tobacco Use  . Smoking status: Current Some Day Smoker  . Smokeless tobacco: Never Used  Substance and Sexual Activity  . Alcohol use: No  . Drug use: Never  . Sexual activity: Not Currently  Other Topics Concern  . Not on file  Social History Narrative  . Not on file   Social Determinants of Health   Financial Resource Strain: Low Risk   . Difficulty of Paying Living Expenses: Not very hard  Food Insecurity: No Food Insecurity  . Worried About Programme researcher, broadcasting/film/video in the Last Year: Never true  . Ran Out of  Food in the Last Year: Never true  Transportation Needs: No Transportation Needs  . Lack of Transportation (Medical): No  . Lack of Transportation (Non-Medical): No  Physical Activity: Unknown  . Days of Exercise per Week: 0 days  . Minutes of Exercise per Session: Not on file  Stress: No Stress Concern Present  . Feeling of Stress : Not at all  Social Connections: Somewhat Isolated  . Frequency of Communication with Friends and Family: More than three times a week  . Frequency of Social Gatherings with Friends and Family: Once a week  . Attends Religious Services: More than 4 times per year  . Active Member of Clubs or Organizations: No  .  Attends Archivist Meetings: Never  . Marital Status: Widowed  Intimate Partner Violence: Not At Risk  . Fear of Current or Ex-Partner: No  . Emotionally Abused: No  . Physically Abused: No  . Sexually Abused: No    Physical Exam Cardiovascular:     Rate and Rhythm: Normal rate and regular rhythm.     Pulses: Normal pulses.  Pulmonary:     Effort: Pulmonary effort is normal.     Breath sounds: Normal breath sounds.  Musculoskeletal:        General: Normal range of motion.     Right lower leg: No edema.     Left lower leg: No edema.  Skin:    General: Skin is warm and dry.     Capillary Refill: Capillary refill takes less than 2 seconds.  Neurological:     Mental Status: She is alert and oriented to person, place, and time.  Psychiatric:        Mood and Affect: Mood normal.         Future Appointments  Date Time Provider [place]  11/03/2019 11:00 AM [facility] PHARMACY [facility] None  11/26/2019  8:05 AM [facility] DEVICE REMOTES [facility]  11/29/2019  2:30 PM Ann Bernard, Ann Doyne, MD [facility] None  12/02/2019 10:00 AM [facility] ECHO OP 1 [facility]  12/02/2019 11:00 AM Ann Dresser, MD [facility] None  02/25/2020  8:05 AM [facility] DEVICE REMOTES [facility]  05/26/2020  8:05 AM [facility] DEVICE REMOTES [facility]  08/25/2020  8:05 AM [facility] DEVICE REMOTES [facility]  11/24/2020  8:05 AM [facility] DEVICE REMOTES [facility]    BP 108/60 (BP Location: Left Arm, Patient Position: Sitting, Cuff Size: Normal)   Pulse 80   Resp 16   Wt 164 lb (74.4 kg)   SpO2 98%   BMI 25.69 kg/m   Weight yesterday- 164 lb Last visit weight- 162 lb  [name] was see at home today and reported feeling generally well. She denied chest pain, SOB, dizziness, orthopnea, fever or cough but stated since starting the Bidil she is having a dull headache. She advised it is manageable with Tylenol and is  getting less intense each day. She reported being compliant with her medications and her weight remains stable. Today we discussed the point at which she should consider taking lasix for weight gain so I reminded her of the 3 lb gain in 24 hours or 5 lb gain in one week. She was understanding and agreeable. Her medications were verified and her pillbox was checked for accuracy. I will follow up in two weeks.   Jacquiline Doe, EMT 10/20/19  ACTION: Home visit completed Next visit planned for 2 weeks

## 2019-10-22 MED FILL — FARXIGA 10 MG TABLET: 10 | 30 days supply | Qty: 30 | Fill #3

## 2019-10-22 MED FILL — DIGOXIN 0.125 MG TABLET: 125 | 90 days supply | Qty: 90 | Fill #1

## 2019-11-03 ENCOUNTER — Other Ambulatory Visit (HOSPITAL_COMMUNITY): Payer: Self-pay | Admitting: Cardiology

## 2019-11-03 ENCOUNTER — Other Ambulatory Visit: Payer: Self-pay

## 2019-11-03 ENCOUNTER — Ambulatory Visit (HOSPITAL_COMMUNITY)
Admission: RE | Admit: 2019-11-03 | Discharge: 2019-11-03 | Disposition: A | Discharge status: Home / Self Care | Payer: Medicare HMO | Source: Ambulatory Visit | Attending: Internal Medicine | Admitting: Internal Medicine

## 2019-11-03 DIAGNOSIS — E785 Hyperlipidemia, unspecified: Secondary | ICD-10-CM | POA: Diagnosis not present

## 2019-11-03 DIAGNOSIS — Z95810 Presence of automatic (implantable) cardiac defibrillator: Secondary | ICD-10-CM | POA: Diagnosis not present

## 2019-11-03 DIAGNOSIS — I447 Left bundle-branch block, unspecified: Secondary | ICD-10-CM | POA: Insufficient documentation

## 2019-11-03 DIAGNOSIS — I428 Other cardiomyopathies: Secondary | ICD-10-CM | POA: Insufficient documentation

## 2019-11-03 DIAGNOSIS — Z79899 Other long term (current) drug therapy: Secondary | ICD-10-CM | POA: Insufficient documentation

## 2019-11-03 DIAGNOSIS — Z8249 Family history of ischemic heart disease and other diseases of the circulatory system: Secondary | ICD-10-CM | POA: Insufficient documentation

## 2019-11-03 DIAGNOSIS — Z87891 Personal history of nicotine dependence: Secondary | ICD-10-CM | POA: Insufficient documentation

## 2019-11-03 DIAGNOSIS — I110 Hypertensive heart disease with heart failure: Secondary | ICD-10-CM | POA: Diagnosis not present

## 2019-11-03 DIAGNOSIS — I5022 Chronic systolic (congestive) heart failure: Secondary | ICD-10-CM | POA: Insufficient documentation

## 2019-11-03 DIAGNOSIS — I2510 Atherosclerotic heart disease of native coronary artery without angina pectoris: Secondary | ICD-10-CM | POA: Insufficient documentation

## 2019-11-03 MED ORDER — BIDIL 20-37.5 MG PO TABS: 1.0000 | ORAL_TABLET | Freq: Three times a day (TID) | ORAL | 11 refills | Status: DC

## 2019-11-03 MED FILL — ENTRESTO 97 MG-103 MG TAB: 97-103 | 30 days supply | Qty: 60 | Fill #3

## 2019-11-03 MED FILL — CARVEDILOL 12.5 MG TABLET: 12.5 | 30 days supply | Qty: 60 | Fill #2

## 2019-11-03 MED FILL — BIDIL 20-37.5 MG TABS: 20-37.5 | 30 days supply | Qty: 90 | Fill #0

## 2019-11-03 NOTE — Progress Notes (Signed)
PCP: Drosinis, Pamalee Leyden, PA-C HF Cardiology: Dr. Aundra Dubin  HPI:  Ms Genson is a 74 y.o. with history of HTN, hyperlipidemia, tobacco abuse, and chronic systolic heart failure. Of note, her brother has heart failure and has an ICD.    Admitted to [facility] in 10/20 with acute systolic heart failure. CTA negative for PE. ECHO was completed and showed severely reduced EF. Diagnostic cath was performed as noted below, mild coronary disease not likely to have caused cardiomyopathy. Diuresed with IV furosemide and transitioned to furosemide 20 mg daily. Started on digoxin, carvedilol, and Entresto. Discharge weight was 174 pounds.       Echo in 2/21 showed EF 20-25% with septal-lateral dyssynchrony. Patient had St Jude CRT-D device placed on 08/27/19.  At heart failure clinic visit  On 10/06/19 with Dr. Aundra Dubin, she had been doing well. No significant exertional dyspnea. No chest pain. She has slept on 2 pillows for a long time. No lightheadedness.    At last heart failure clinic visit with pharmacy clinic, she was feeling well. She denied dizziness, lightheadedness, and fatigue. No chest pain or palpitations. She only reported getting SOB when doing moderate exercise or when walking up stairs. She was able to complete all ADLs without issues. She checked her weight at home (normal range 161-163 lbs). She had not needed to take furosemide for edema in several months. No LEE or PND/Orthopnea. She reported adherence to a low-salt diet.   Today she returns to HF clinic for pharmacist medication titration. At last visit with pharmacy clinic, she was initiated on low-dose BiDil (one-half tablet TID). Symptomatically, she is doing well. She denies headaches, dizziness, lightheadedness, and fatigue. No chest pain or palpitations. She denies shortness of breath, even with going up and down stairs. Walking is the only exercise she has been able to perform due to the [place] pandemic. She is able to complete all ADLs without  issues. She checks her weight at home (average weight ~ 162 lbs). She has not needed to take furosemide for edema in months. No LEE or PND/Orthopnea. Her appetite has been the same. She adheres to a low-salt diet. Mostly eats salads, chicken, fish, corn. She is trying to bake food rather than fry. She can perform all ADLs. Weight has been stable at 163 lbs in clinic.  HF Medications: Carvedilol 12.5 mg BID Entresto 97-103 mg BID  Spironolactone 25 mg daily BiDil 20-37.5 mg 1/2 tablet TID Dapagliflozin 10 mg daily Digoxin 0.125 mg daily Furosemide 20 mg daily PRN for fluid retention Potassium chloride 10 mEq PRN if taking furosemide  Has the patient been experiencing any side effects to the medications prescribed?  no  Does the patient have any problems obtaining medications due to transportation or finances?   no, she has [employer] with LIS  Understanding of regimen: fair Understanding of indications: fair Potential of compliance: fair Patient understands to avoid NSAIDs. Patient understands to avoid decongestants.   Pertinent Lab Values (09/01/19):  Serum creatinine 0.73, BUN 12, Potassium 3.6, Sodium 141, Digoxin 0.6 ng/mL, LDL 64, HDL 66, Triglycerides 58  Vital Signs:  Weight: 163 lbs (last clinic weight: 162 lbs)  Blood pressure: 118/74  Heart rate: 61  Assessment: 1. Chronic systolic CHF: Nonischemic cardiomyopathy. Echo 10/20 with EF < 20%.  LHC/RHC 10/20 showed nonobstructive coronary disease, CI 2.3. Cardiac MRI (10/20) showed EF 13%, moderate LV dilation, septal-lateral dyssynchrony c/w LBBB and diffuse severe hypokinesis, normal RV size with moderate systolic dysfunction EF 28%, mild-moderate MR,  moderate AI, late gadolinium enhancement at inferior RV insertion site (nonspecific). Echo in 2/21 showed that EF remained 20-25%. She has a wide LBBB => St Jude CRT-D device placed. ?Familial cardiomyopathy, brother with ICD but do not know why - NYHA class II symptoms,  not volume overloaded on exam - Continue furosemide 20 mg daily PRN. Has not needed in several months - Continue carvedilol 12.5 mg BID - Continue Entresto 97-103 mg BID - Continue spironolactone 25 mg daily - Continue dapagliflozin 10 mg daily - Increase BiDil to 20-37.5 mg - take one tablet TID - Continue digoxin 0.125 mg daily. Last level 0.6 ng/mL on 09/01/19 -Has paramedicine to help with medications  2. CAD: Nonobstructive CAD - Continue atorvastatin 40 mg daily (LDL 64)     3. Smoking: She has now quit     Plan: 1) Medication changes: Based on clinical presentation, vital signs and recent labs will Increase BiDil to 20-37.5 mg - take one tablet TID 2) Follow-up: 1 month with Dr. Colin Mulders, PharmD PGY1 Acute Care Pharmacy Resident  Karle Plumber, PharmD, BCPS, [facility], CPP Heart Failure Clinic Pharmacist [phone]

## 2019-11-03 NOTE — Patient Instructions (Addendum)
It was a pleasure seeing you today!  MEDICATIONS: -We are changing your medications today -Increase BiDil to 1 tablet (20-37.5 mg) three times a day -Call if you have questions about your medications.  NEXT APPOINTMENT: Return to clinic in 1 month for office visit with Dr. Shirlee Latch.  In general, to take care of your heart failure: -Limit your fluid intake to 2 Liters (half-gallon) per day.   -Limit your salt intake to ideally 2-3 grams (2000-3000 mg) per day. -Weigh yourself daily and record, and bring that "weight diary" to your next appointment.  (Weight gain of 2-3 pounds in 1 day typically means fluid weight.) -The medications for your heart are to help your heart and help you live longer.   -Please contact us before stopping any of your heart medications.  Call the clinic at [phone] with questions or to reschedule future appointments.

## 2019-11-10 ENCOUNTER — Other Ambulatory Visit (HOSPITAL_COMMUNITY): Payer: Self-pay

## 2019-11-10 NOTE — Progress Notes (Signed)
Paramedicine Encounter    Patient ID: Ann Bernard, female    DOB: 26-Dec-1945, 74 y.o.   MRN: [id number]   Patient Care Team: Drosinis, Pamalee Leyden, PA-C as PCP - General (Internal Medicine) Constance Haw, MD as PCP - Electrophysiology (Cardiology)  Patient Active Problem List   Diagnosis Date Noted  . Acute systolic HF (heart failure) ([place])   . Precordial chest pain   . Acute coronary syndrome ([place]) 03/15/2019  . Acute CHF (congestive heart failure) ([place]) 03/14/2019  . DYSLIPIDEMIA 01/25/2009  . TOBACCO ABUSE 01/25/2009  . Essential hypertension 01/25/2009  . ECTOPIC PREGNANCY 01/25/2009  . UTERINE PROLAPSE 01/24/2009  . URINARY INCONTINENCE 01/24/2009    Current Outpatient Medications:  .  alendronate (FOSAMAX) 70 MG tablet, Take 70 mg by mouth once a week. Every Monday, Disp: , Rfl:  .  atorvastatin (LIPITOR) 40 MG tablet, Take 1 tablet (40 mg total) by mouth daily at 6 PM., Disp: 90 tablet, Rfl: 3 .  carvedilol (COREG) 12.5 MG tablet, Take 1 tablet (12.5 mg total) by mouth 2 (two) times daily., Disp: 60 tablet, Rfl: 3 .  dapagliflozin propanediol (FARXIGA) 10 MG TABS tablet, Take 10 mg by mouth daily before breakfast., Disp: 30 tablet, Rfl: 5 .  digoxin (LANOXIN) 0.125 MG tablet, Take 1 tablet (0.125 mg total) by mouth daily., Disp: 90 tablet, Rfl: 3 .  isosorbide-hydrALAZINE (BIDIL) 20-37.5 MG tablet, Take 1 tablet by mouth 3 (three) times daily., Disp: 90 tablet, Rfl: 11 .  Multiple Vitamins-Minerals (PRESERVISION AREDS 2 PO), Take 1 capsule by mouth daily., Disp: , Rfl:  .  sacubitril-valsartan (ENTRESTO) 97-103 MG, Take 1 tablet by mouth 2 (two) times daily., Disp: 60 tablet, Rfl: 11 .  spironolactone (ALDACTONE) 25 MG tablet, Take 1 tablet (25 mg total) by mouth daily., Disp: 90 tablet, Rfl: 3 .  diphenhydrAMINE (SOMINEX) 25 MG tablet, Take 1 tablet by mouth daily as needed for itching. , Disp: , Rfl:  .  furosemide (LASIX) 20 MG tablet, Take 1 tablet (20 mg total) by  mouth as needed for fluid. (Patient not taking: Reported on 10/06/2019), Disp: 90 tablet, Rfl: 3 .  potassium chloride (KLOR-CON) 10 MEQ tablet, TAKE 1 TABLET BY MOUTH WHEN YOU TAKE LASIX (Patient not taking: Reported on 09/15/2019), Disp: 90 tablet, Rfl: 1 Allergies  Allergen Reactions  . Aspirin Nausea Only and Other (See Comments)    GI Upset, stomach cramps       Social History   Socioeconomic History  . Marital status: Widowed    Spouse name: Not on file  . Number of children: Not on file  . Years of education: Not on file  . Highest education level: Not on file  Occupational History  . Not on file  Tobacco Use  . Smoking status: Current Some Day Smoker  . Smokeless tobacco: Never Used  Vaping Use  . Vaping Use: Never used  Substance and Sexual Activity  . Alcohol use: No  . Drug use: Never  . Sexual activity: Not Currently  Other Topics Concern  . Not on file  Social History Narrative  . Not on file   Social Determinants of Health   Financial Resource Strain: Low Risk   . Difficulty of Paying Living Expenses: Not very hard  Food Insecurity: No Food Insecurity  . Worried About Charity fundraiser in the Last Year: Never true  . Ran Out of Food in the Last Year: Never true  Transportation Needs: No Transportation Needs  .  Lack of Transportation (Medical): No  . Lack of Transportation (Non-Medical): No  Physical Activity: Unknown  . Days of Exercise per Week: 0 days  . Minutes of Exercise per Session: Not on file  Stress: No Stress Concern Present  . Feeling of Stress : Not at all  Social Connections: Moderately Isolated  . Frequency of Communication with Friends and Family: More than three times a week  . Frequency of Social Gatherings with Friends and Family: Once a week  . Attends Religious Services: More than 4 times per year  . Active Member of Clubs or Organizations: No  . Attends Banker Meetings: Never  . Marital Status: Widowed  Intimate  Partner Violence: Not At Risk  . Fear of Current or Ex-Partner: No  . Emotionally Abused: No  . Physically Abused: No  . Sexually Abused: No    Physical Exam Cardiovascular:     Rate and Rhythm: Normal rate and regular rhythm.     Pulses: Normal pulses.  Pulmonary:     Effort: Pulmonary effort is normal.     Breath sounds: Normal breath sounds.  Musculoskeletal:        General: Normal range of motion.     Right lower leg: No edema.     Left lower leg: No edema.  Skin:    General: Skin is warm and dry.     Capillary Refill: Capillary refill takes less than 2 seconds.  Neurological:     Mental Status: She is alert and oriented to person, place, and time.  Psychiatric:        Mood and Affect: Mood normal.         Future Appointments  Date Time Provider Department Center  11/26/2019  8:05 AM [facility] DEVICE REMOTES [facility]  11/29/2019  2:30 PM Camnitz, Andree Coss, MD CVD-HIGHPT None  12/02/2019 10:00 AM MC ECHO OP 1 [facility]  12/02/2019 11:00 AM Laurey Morale, MD MC-HVSC None  02/25/2020  8:05 AM [facility] DEVICE REMOTES [facility]  05/26/2020  8:05 AM [facility] DEVICE REMOTES [facility]  08/25/2020  8:05 AM [facility] DEVICE REMOTES [facility]  11/24/2020  8:05 AM [facility] DEVICE REMOTES [facility]    BP 130/70 (BP Location: Left Arm, Patient Position: Sitting, Cuff Size: Normal)   Pulse 60   Resp 16   Wt 162 lb 3.2 oz (73.6 kg)   SpO2 100%   BMI 25.40 kg/m   Weight yesterday- 162.2 lb Last visit weight- 163 lb  [name] was seen at home today and reported feeling well. She denied chest pain, SOB, headache, dizziness, orthopnea, fever or cough over the past week. She reported being compliant with her medications and her weight has been stable. She has been very capable with managing her heart failure during our time working together and in my opinion is stable enough to be discharged  from paramedicine. We discussed this and she was agreeable. She was informed that I would still be available via the heart failure clinic if she should find herself needing assistance and she was understanding. I will contact the HF clinic and let them know of her discharge   Jacqualine Code, EMT 11/10/19  ACTION: Home visit completed

## 2019-11-22 MED FILL — FARXIGA 10 MG TABLET: 10 | 30 days supply | Qty: 30 | Fill #4

## 2019-11-26 ENCOUNTER — Ambulatory Visit (INDEPENDENT_AMBULATORY_CARE_PROVIDER_SITE_OTHER): Payer: Medicare HMO | Admitting: *Deleted

## 2019-11-26 DIAGNOSIS — I428 Other cardiomyopathies: Secondary | ICD-10-CM | POA: Diagnosis not present

## 2019-11-26 LAB — CUP PACEART REMOTE DEVICE CHECK
Battery Remaining Longevity: 42 mo
Battery Remaining Percentage: 90 %
Battery Voltage: 2.95 V
Brady Statistic AP VP Percent: 42 %
Brady Statistic AP VS Percent: 1 %
Brady Statistic AS VP Percent: 58 %
Brady Statistic AS VS Percent: 1 %
Brady Statistic RA Percent Paced: 42 %
Date Time Interrogation Session: 20210709020657
HighPow Impedance: 60 Ohm
Implantable Lead Implant Date: 20210409
Implantable Lead Implant Date: 20210409
Implantable Lead Implant Date: 20210409
Implantable Lead Location: 753858
Implantable Lead Location: 753859
Implantable Lead Location: 753860
Implantable Pulse Generator Implant Date: 20210409
Lead Channel Impedance Value: 380 Ohm
Lead Channel Impedance Value: 480 Ohm
Lead Channel Impedance Value: 630 Ohm
Lead Channel Pacing Threshold Amplitude: 0.75 V
Lead Channel Pacing Threshold Amplitude: 0.75 V
Lead Channel Pacing Threshold Amplitude: 2 V
Lead Channel Pacing Threshold Pulse Width: 0.5 ms
Lead Channel Pacing Threshold Pulse Width: 0.5 ms
Lead Channel Pacing Threshold Pulse Width: 1 ms
Lead Channel Sensing Intrinsic Amplitude: 11.9 mV
Lead Channel Sensing Intrinsic Amplitude: 2.6 mV
Lead Channel Setting Pacing Amplitude: 3.5 V
Lead Channel Setting Pacing Amplitude: 3.5 V
Lead Channel Setting Pacing Amplitude: 3.5 V
Lead Channel Setting Pacing Pulse Width: 0.5 ms
Lead Channel Setting Pacing Pulse Width: 1 ms
Lead Channel Setting Sensing Sensitivity: 0.5 mV
Pulse Gen Serial Number: 111006673

## 2019-11-29 ENCOUNTER — Encounter: Payer: Medicare HMO | Admitting: Cardiology

## 2019-11-29 NOTE — Progress Notes (Signed)
Remote ICD transmission.   

## 2019-12-02 ENCOUNTER — Other Ambulatory Visit: Payer: Self-pay

## 2019-12-02 ENCOUNTER — Ambulatory Visit (HOSPITAL_BASED_OUTPATIENT_CLINIC_OR_DEPARTMENT_OTHER)
Admission: RE | Admit: 2019-12-02 | Discharge: 2019-12-02 | Disposition: A | Discharge status: Home / Self Care | Payer: Medicare HMO | Source: Ambulatory Visit | Attending: Cardiology | Admitting: Cardiology

## 2019-12-02 ENCOUNTER — Encounter (HOSPITAL_COMMUNITY): Payer: Self-pay | Admitting: Cardiology

## 2019-12-02 ENCOUNTER — Ambulatory Visit (HOSPITAL_COMMUNITY)
Admission: RE | Admit: 2019-12-02 | Discharge: 2019-12-02 | Disposition: A | Discharge status: Home / Self Care | Payer: Medicare HMO | Source: Ambulatory Visit | Attending: Cardiology | Admitting: Cardiology

## 2019-12-02 VITALS — BP 124/76 | HR 60 | Wt 164.0 lb

## 2019-12-02 DIAGNOSIS — I358 Other nonrheumatic aortic valve disorders: Secondary | ICD-10-CM | POA: Diagnosis not present

## 2019-12-02 DIAGNOSIS — Z79899 Other long term (current) drug therapy: Secondary | ICD-10-CM | POA: Diagnosis not present

## 2019-12-02 DIAGNOSIS — F17200 Nicotine dependence, unspecified, uncomplicated: Secondary | ICD-10-CM | POA: Insufficient documentation

## 2019-12-02 DIAGNOSIS — I2510 Atherosclerotic heart disease of native coronary artery without angina pectoris: Secondary | ICD-10-CM | POA: Diagnosis not present

## 2019-12-02 DIAGNOSIS — Z8249 Family history of ischemic heart disease and other diseases of the circulatory system: Secondary | ICD-10-CM | POA: Diagnosis not present

## 2019-12-02 DIAGNOSIS — I447 Left bundle-branch block, unspecified: Secondary | ICD-10-CM | POA: Diagnosis not present

## 2019-12-02 DIAGNOSIS — I5022 Chronic systolic (congestive) heart failure: Secondary | ICD-10-CM | POA: Insufficient documentation

## 2019-12-02 DIAGNOSIS — I110 Hypertensive heart disease with heart failure: Secondary | ICD-10-CM | POA: Insufficient documentation

## 2019-12-02 DIAGNOSIS — Z7984 Long term (current) use of oral hypoglycemic drugs: Secondary | ICD-10-CM | POA: Insufficient documentation

## 2019-12-02 DIAGNOSIS — E785 Hyperlipidemia, unspecified: Secondary | ICD-10-CM | POA: Diagnosis not present

## 2019-12-02 DIAGNOSIS — I420 Dilated cardiomyopathy: Secondary | ICD-10-CM | POA: Diagnosis present

## 2019-12-02 DIAGNOSIS — Z7983 Long term (current) use of bisphosphonates: Secondary | ICD-10-CM | POA: Diagnosis not present

## 2019-12-02 DIAGNOSIS — I5021 Acute systolic (congestive) heart failure: Secondary | ICD-10-CM | POA: Diagnosis not present

## 2019-12-02 LAB — BASIC METABOLIC PANEL
Anion gap: 9 (ref 5–15)
BUN: 13 mg/dL (ref 8–23)
CO2: 25 mmol/L (ref 22–32)
Calcium: 9 mg/dL (ref 8.9–10.3)
Chloride: 108 mmol/L (ref 98–111)
Creatinine, Ser: 0.79 mg/dL (ref 0.44–1.00)
GFR calc Af Amer: >60 mL/min (ref >60)
GFR calc non Af Amer: >60 mL/min (ref >60)
Glucose, Bld: 107 mg/dL — ABNORMAL HIGH (ref 70–99)
Potassium: 3.8 mmol/L (ref 3.5–5.1)
Sodium: 142 mmol/L (ref 135–145)

## 2019-12-02 NOTE — Progress Notes (Signed)
  Echocardiogram 2D Echocardiogram has been performed.  Gerda Diss 12/02/2019, 10:42 AM

## 2019-12-02 NOTE — Progress Notes (Signed)
PCP: Drosinis, Leonia Reader, PA-C HF Cardiology: Dr. Shirlee Latch  Ann Bernard is a 74 y.o. with history of HTN, hyperlipidemia, tobacco abuse, and chronic systolic heart failure. Of note, her brother has heart failure and has an ICD.   Admitted to [facility] in 10/20 with acute systolic heart failure. CTA negative for PE. ECHO was completed and showed severely reduced EF. Diagnostic cath was performed as noted below, mild coronary disease not likely to have caused cardiomyopathy.  Diuresed with IV lasix and transitioned to lasix 20 mg daily. Started on digoxin, carvedilol, and Entresto. Discharge weight 174 pounds.      Echo in 2/21 showed EF 20-25% with septal-lateral dyssynchrony.  Patient had St Jude CRT-D device placed.   Echo was done today and reviewed, EF up to 50% with mild LVH, normal RV.   She returns for followup of CHF.  She is doing well.  No dyspnea walking on flat ground.  No problem walking up a flight of stairs.  No orthopnea/PND.  No chest pain.  Weight is down 2 lbs.   Labs (2/21): digoxin 0.5, K 3.8, creatinine 0.78 Labs (4/21): LDL 64, K 3.6, creatinine 5.91  PMH: 1. Chronic systolic CHF: Nonischemic cardiomyopathy.  - Echo (10/20): EF < 20%, moderately dilated LV, severe global HK.  - RHC/LHC (10/20): D1 and D2 with 50% ostial stenosis.  PCWP 23, CI 2.34.  - CMRI (10/20): Moderately dilated LV with EF 13%, diffuse hypokinesis, septal-lateral dyssynchrony, normal RV size with EF 30%, nonspecific RV insertion site LGE.   - Echo (2/21): EF 20-25%, septal-lateral dyssynchrony, mild AI, mild-moderately decreased RV systolic function.  - St Jude CRT-D device placed.  - Echo (7/21): EF 50%, mild LVH, normal RV 2. Active smoker.  3. Hyperlipidemia 4. HTN 5. ABIs (2/21): Normal.   ROS: All systems negative except as listed in HPI, PMH and Problem List.  SH:  Social History   Socioeconomic History  . Marital status: Widowed    Spouse name: Not on file  . Number of children:  Not on file  . Years of education: Not on file  . Highest education level: Not on file  Occupational History  . Not on file  Tobacco Use  . Smoking status: Current Some Day Smoker  . Smokeless tobacco: Never Used  Vaping Use  . Vaping Use: Never used  Substance and Sexual Activity  . Alcohol use: No  . Drug use: Never  . Sexual activity: Not Currently  Other Topics Concern  . Not on file  Social History Narrative  . Not on file   Social Determinants of Health   Financial Resource Strain: Low Risk   . Difficulty of Paying Living Expenses: Not very hard  Food Insecurity: No Food Insecurity  . Worried About Programme researcher, broadcasting/film/video in the Last Year: Never true  . Ran Out of Food in the Last Year: Never true  Transportation Needs: No Transportation Needs  . Lack of Transportation (Medical): No  . Lack of Transportation (Non-Medical): No  Physical Activity: Unknown  . Days of Exercise per Week: 0 days  . Minutes of Exercise per Session: Not on file  Stress: No Stress Concern Present  . Feeling of Stress : Not at all  Social Connections: Moderately Isolated  . Frequency of Communication with Friends and Family: More than three times a week  . Frequency of Social Gatherings with Friends and Family: Once a week  . Attends Religious Services: More than 4 times per  year  . Active Member of Clubs or Organizations: No  . Attends Banker Meetings: Never  . Marital Status: Widowed  Intimate Partner Violence: Not At Risk  . Fear of Current or Ex-Partner: No  . Emotionally Abused: No  . Physically Abused: No  . Sexually Abused: No    FH:  Family History  Problem Relation Age of Onset  . Hypertension Mother   . Diabetes Mother   . Diabetes Brother   . Alcoholism Father   . Diabetes type I Grandson     Current Outpatient Medications  Medication Sig Dispense Refill  . alendronate (FOSAMAX) 70 MG tablet Take 70 mg by mouth once a week. Every Monday    . atorvastatin  (LIPITOR) 40 MG tablet Take 1 tablet (40 mg total) by mouth daily at 6 PM. 90 tablet 3  . carvedilol (COREG) 12.5 MG tablet Take 1 tablet (12.5 mg total) by mouth 2 (two) times daily. 60 tablet 3  . dapagliflozin propanediol (FARXIGA) 10 MG TABS tablet Take 10 mg by mouth daily before breakfast. 30 tablet 5  . diphenhydrAMINE (SOMINEX) 25 MG tablet Take 1 tablet by mouth daily as needed for itching.     . furosemide (LASIX) 20 MG tablet Take 1 tablet (20 mg total) by mouth as needed for fluid. 90 tablet 3  . isosorbide-hydrALAZINE (BIDIL) 20-37.5 MG tablet Take 1 tablet by mouth 3 (three) times daily. 90 tablet 11  . Multiple Vitamins-Minerals (PRESERVISION AREDS 2 PO) Take 1 capsule by mouth daily.    . potassium chloride (KLOR-CON) 10 MEQ tablet TAKE 1 TABLET BY MOUTH WHEN YOU TAKE LASIX 90 tablet 1  . sacubitril-valsartan (ENTRESTO) 97-103 MG Take 1 tablet by mouth 2 (two) times daily. 60 tablet 11  . spironolactone (ALDACTONE) 25 MG tablet Take 1 tablet (25 mg total) by mouth daily. 90 tablet 3   No current facility-administered medications for this encounter.    Vitals:   12/02/19 1100  BP: 124/76  Pulse: 60  SpO2: 99%  Weight: 74.4 kg (164 lb)   Wt Readings from Last 3 Encounters:  12/02/19 74.4 kg (164 lb)  11/10/19 73.6 kg (162 lb 3.2 oz)  11/03/19 74.2 kg (163 lb 9.6 oz)   PHYSICAL EXAM: General: NAD Neck: No JVD, no thyromegaly or thyroid nodule.  Lungs: Clear to auscultation bilaterally with normal respiratory effort. CV: Nondisplaced PMI.  Heart regular S1/S2, no S3/S4, no murmur.  No peripheral edema.  No carotid bruit.  Normal pedal pulses.  Abdomen: Soft, nontender, no hepatosplenomegaly, no distention.  Skin: Intact without lesions or rashes.  Neurologic: Alert and oriented x 3.  Psych: Normal affect. Extremities: No clubbing or cyanosis.  HEENT: Normal.   ASSESSMENT & PLAN: 1. Chronic systolic CHF: Nonischemic cardiomyopathy. Echo 10/20 with EF < 20%.  LHC/RHC  10/20 showed nonobstructive coronary disease, CI 2.3.  Cardiac MRI (10/20) showed EF 13%, moderate LV dilation, septal-lateral dyssynchrony c/w LBBB and diffuse severe hypokinesis, normal RV size with moderate systolic dysfunction EF 30%, mild-moderate MR, moderate AI, LGE at inferior RV insertion site (nonspecific). Echo in 2/21 showed that EF remained 20-25%. She has a wide LBBB => St Jude CRT-D device placed.?Familial cardiomyopathy, brother with ICD but do not know why.  Echo today showed improvement in EF up to 50%.  NYHA class II symptoms, not volume overloaded.  - She can stop digoxin.   - Continue Coreg 12.5 mg bid.  - Continue Entresto 97/103 mg twice a day. BMET  today.    - Continue spironolactone to 25 mg daily.  - Continue Farxiga 10 mg daily.  - Continue Bidil 1 tab tid.  2. CAD: Nonobstructive CAD.  - Continue atorvastatin, good lipids 4/21.    3. Smoking: She has now quit.       BMET 3 months, followup in 6 months.   Marca Ancona 12/02/2019

## 2019-12-02 NOTE — Patient Instructions (Signed)
STOP Digoxin  Routine lab work today. Will notify you of abnormal results  Repeat labs in 3 months (bmet)   Follow up in 6 months   Do the following things EVERYDAY: 1) Weigh yourself in the morning before breakfast. Write it down and keep it in a log. 2) Take your medicines as prescribed 3) Eat low salt foods--Limit salt (sodium) to 2000 mg per day.  4) Stay as active as you can everyday 5) Limit all fluids for the day to less than 2 liters

## 2019-12-06 MED FILL — ENTRESTO 97 MG-103 MG TAB: 97-103 | 30 days supply | Qty: 60 | Fill #4

## 2019-12-06 MED FILL — CARVEDILOL 12.5 MG TABLET: 12.5 | 30 days supply | Qty: 60 | Fill #3

## 2019-12-06 MED FILL — BIDIL 20-37.5 MG TABS: 20-37.5 | 30 days supply | Qty: 90 | Fill #1

## 2019-12-21 MED FILL — FARXIGA 10 MG TABLET: 10 | 30 days supply | Qty: 30 | Fill #0

## 2020-01-03 ENCOUNTER — Other Ambulatory Visit (HOSPITAL_COMMUNITY): Payer: Self-pay | Admitting: Cardiology

## 2020-01-03 MED FILL — BIDIL 20-37.5 MG TABS: 20-37.5 | 30 days supply | Qty: 90 | Fill #2

## 2020-01-03 MED FILL — ATORVASTATIN CALCIUM 40 MG: 40 | 90 days supply | Qty: 90 | Fill #2

## 2020-01-03 MED FILL — ENTRESTO 97 MG-103 MG TAB: 97-103 | 30 days supply | Qty: 60 | Fill #5

## 2020-01-04 MED FILL — CARVEDILOL 12.5 MG TABLET: 12.5 | 30 days supply | Qty: 60 | Fill #0

## 2020-01-17 ENCOUNTER — Encounter: Payer: Self-pay | Admitting: Cardiology

## 2020-01-17 ENCOUNTER — Other Ambulatory Visit: Payer: Self-pay

## 2020-01-17 ENCOUNTER — Ambulatory Visit (INDEPENDENT_AMBULATORY_CARE_PROVIDER_SITE_OTHER): Payer: Medicare HMO | Admitting: Cardiology

## 2020-01-17 VITALS — BP 134/78 | HR 60 | Ht 67.0 in | Wt 165.0 lb

## 2020-01-17 DIAGNOSIS — I428 Other cardiomyopathies: Secondary | ICD-10-CM

## 2020-01-17 NOTE — Patient Instructions (Signed)
Medication Instructions:  Your physician recommends that you continue on your current medications as directed. Please refer to the Current Medication list given to you today.  *If you need a refill on your cardiac medications before your next appointment, please call your pharmacy*   Lab Work: None ordered If you have labs (blood work) drawn today and your tests are completely normal, you will receive your results only by: Marland Kitchen MyChart Message (if you have MyChart) OR . A paper copy in the mail If you have any lab test that is abnormal or we need to change your treatment, we will call you to review the results.   Testing/Procedures: None ordered   Follow-Up: Remote monitoring is used to monitor your Pacemaker of ICD from home. This monitoring reduces the number of office visits required to check your device to one time per year. It allows [place] to keep an eye on the functioning of your device to ensure it is working properly. You are scheduled for a device check from home on 02/25/2020. You may send your transmission at any time that day. If you have a wireless device, the transmission will be sent automatically. After your physician reviews your transmission, you will receive a postcard with your next transmission date.  At [facility], you and your health needs are our priority.  As part of our continuing mission to provide you with exceptional heart care, we have created designated Provider Care Teams.  These Care Teams include your primary Cardiologist (physician) and Advanced Practice Providers (APPs -  Physician Assistants and Nurse Practitioners) who all work together to provide you with the care you need, when you need it.  We recommend signing up for the patient portal called "MyChart".  Sign up information is provided on this After Visit Summary.  MyChart is used to connect with patients for Virtual Visits (Telemedicine).  Patients are able to view lab/test results, encounter notes,  upcoming appointments, etc.  Non-urgent messages can be sent to your provider as well.   To learn more about what you can do with MyChart, go to [email or web address].    Your next appointment:   9 month(s)  The format for your next appointment:   In Person  Provider:   Loman Brooklyn, MD   Thank you for choosing [facility]!!   Dory Horn, RN [phone]    Other Instructions

## 2020-01-17 NOTE — Progress Notes (Signed)
Electrophysiology Office Note   Date:  01/17/2020   ID:  Ann, Bernard August 06, 1945, MRN [id number]  PCP:  Ann, Leonia Reader, PA-C  Cardiologist:  Shirlee Latch Primary Electrophysiologist:  Siddalee Vanderheiden Jorja Loa, MD    Chief Complaint: CHF   History of Present Illness: Ann [facility] is a 74 y.o. female who is being seen today for the evaluation of CHF at the request of Ann, Leonia Reader, PA-C. Presenting today for electrophysiology evaluation.  She has a history significant for hypertension, hyperlipidemia, tobacco abuse, and chronic systolic heart failure.  She presented to [facility] 2020 with acute systolic heart failure.  Diagnostic cath was performed that showed mild coronary artery disease.  She was started on optimal medical therapy.  Repeat echo shows persistently low ejection fraction.  She is now status post Abbott CRT-D implanted 08/27/2019.  Repeat echo showed an ejection fraction of 50%.  Today, denies symptoms of palpitations, chest pain, shortness of breath, orthopnea, PND, lower extremity edema, claudication, dizziness, presyncope, syncope, bleeding, or neurologic sequela. The patient is tolerating medications without difficulties.  She has been doing well.  She has no chest pain or shortness of breath.  She is able to do all of her daily activities restriction.  She feels much improved since her device pointed shortness of breath   Past Medical History:  Diagnosis Date  . Coronary artery disease   . Hypertension    Past Surgical History:  Procedure Laterality Date  . BIV ICD INSERTION CRT-D N/A 08/27/2019   Procedure: BIV ICD INSERTION CRT-D;  Surgeon: Regan Lemming, MD;  [facility]: [facility];  Service: Cardiovascular;  Laterality: N/A;  . RIGHT/LEFT HEART CATH AND CORONARY ANGIOGRAPHY N/A 03/16/2019   Procedure: RIGHT/LEFT HEART CATH AND CORONARY ANGIOGRAPHY;  Surgeon: Lyn Records, MD;  Location: [facility];  Service:  Cardiovascular;  Laterality: N/A;     Current Outpatient Medications  Medication Sig Dispense Refill  . alendronate (FOSAMAX) 70 MG tablet Take 70 mg by mouth once a week. Every Monday    . atorvastatin (LIPITOR) 40 MG tablet Take 1 tablet (40 mg total) by mouth daily at 6 PM. 90 tablet 3  . carvedilol (COREG) 12.5 MG tablet TAKE 1 TABLET BY MOUTH TWO TIMES DAILY 60 tablet 11  . dapagliflozin propanediol (FARXIGA) 10 MG TABS tablet Take 10 mg by mouth daily before breakfast. 30 tablet 5  . diphenhydrAMINE (SOMINEX) 25 MG tablet Take 1 tablet by mouth daily as needed for itching.     . furosemide (LASIX) 20 MG tablet Take 1 tablet (20 mg total) by mouth as needed for fluid. 90 tablet 3  . isosorbide-hydrALAZINE (BIDIL) 20-37.5 MG tablet Take 1 tablet by mouth 3 (three) times daily. 90 tablet 11  . Multiple Vitamins-Minerals (PRESERVISION AREDS 2 PO) Take 1 capsule by mouth daily.    . potassium chloride (KLOR-CON) 10 MEQ tablet TAKE 1 TABLET BY MOUTH WHEN YOU TAKE LASIX 90 tablet 1  . sacubitril-valsartan (ENTRESTO) 97-103 MG Take 1 tablet by mouth 2 (two) times daily. 60 tablet 11  . spironolactone (ALDACTONE) 25 MG tablet Take 1 tablet (25 mg total) by mouth daily. 90 tablet 3   No current facility-administered medications for this visit.    Allergies:   Aspirin   Social History:  The patient  reports that she has been smoking. She has never used smokeless tobacco. She reports that she does not drink alcohol and does not use drugs.  Family History:  The patient's family history includes Alcoholism in her father; Diabetes in her brother and mother; Diabetes type I in her grandson; Hypertension in her mother.    ROS:  Please see the history of present illness.   Otherwise, review of systems is positive for none.   All other systems are reviewed and negative.   PHYSICAL EXAM: VS:  BP 134/78   Pulse 60   Ht 5\' 7"  (1.702 m)   Wt 165 lb (74.8 kg)   SpO2 97%   BMI 25.84 kg/m  , BMI  Body mass index is 25.84 kg/m. GEN: Well nourished, well developed, in no acute distress  HEENT: normal  Neck: no JVD, carotid bruits, or masses Cardiac: RRR; no murmurs, rubs, or gallops,no edema  Respiratory:  clear to auscultation bilaterally, normal work of breathing GI: soft, nontender, nondistended, + BS MS: no deformity or atrophy  Skin: warm and dry, device site well healed Neuro:  Strength and sensation are intact Psych: euthymic mood, full affect  EKG:  EKG is ordered today. Personal review of the ekg ordered shows AV paced  Personal review of the device interrogation today. Results in Paceart   Recent Labs: 03/14/2019: ALT 38; B Natriuretic Peptide 1,035.7 03/17/2019: TSH 2.833 08/27/2019: Hemoglobin 14.5; Platelets 179 12/02/2019: BUN 13; Creatinine, Ser 0.79; Potassium 3.8; Sodium 142    Lipid Panel     Component Value Date/Time   CHOL 142 09/01/2019 1143   TRIG 58 09/01/2019 1143   HDL 66 09/01/2019 1143   CHOLHDL 2.2 09/01/2019 1143   VLDL 12 09/01/2019 1143   LDLCALC 64 09/01/2019 1143   LDLDIRECT 138.3 01/25/2009 1524     Wt Readings from Last 3 Encounters:  01/17/20 165 lb (74.8 kg)  12/02/19 164 lb (74.4 kg)  11/10/19 162 lb 3.2 oz (73.6 kg)      Other studies Reviewed: Additional studies/ records that were reviewed today include: TTE 12/02/2019 Review of the above records today demonstrates:  1. Left ventricular ejection fraction, by estimation, is 50%. The left  ventricle has mildly decreased function. The left ventricle demonstrates  global hypokinesis. There is mild left ventricular hypertrophy. Left  ventricular diastolic parameters are  consistent with Grade I diastolic dysfunction (impaired relaxation).  2. Right ventricular systolic function is normal. The right ventricular  size is normal. There is normal pulmonary artery systolic pressure. The  estimated right ventricular systolic pressure is 20.1 mmHg.  3. Left atrial size was  mildly dilated.  4. The mitral valve is normal in structure. No evidence of mitral valve  regurgitation. No evidence of mitral stenosis.  5. The aortic valve is tricuspid. Aortic valve regurgitation is trivial.  Mild aortic valve sclerosis is present, with no evidence of aortic valve  stenosis.  6. The inferior vena cava is normal in size with greater than 50%  respiratory variability, suggesting right atrial pressure of 3 mmHg.   RHC/LHC 03/16/19  Right dominant coronary anatomy.  Normal left main.  Widely patent LAD with each of 2 diagonals having 50% ostial narrowing.  Widely patent circumflex.  Minimal luminal irregularities in the proximal mid and distal RCA.  Elevated left ventricular end-diastolic pressure, greater than 20 mmHg.  Mild pulmonary hypertension with mean pressure 33 mmHg.  Pulmonary capillary wedge mean 23 mmHg.  V wave 33 mmHg.   ASSESSMENT AND PLAN:  1.  Chronic systolic heart failure due to nonischemic cardiomyopathy: Currently on optimal medical therapy with carvedilol, Entresto, Aldactone.  Is now status  post Abbott CRT-D implanted 12/02/2019.  Device functioning appropriately.  Device outputs reprogrammed for long-term usage.  2.  Nonobstructive coronary artery disease: No current chest pain.  Continue atorvastatin.  3.  Tobacco abuse: Has quit smoking.  Current medicines are reviewed at length with the patient today.   The patient does not have concerns regarding her medicines.  The following changes were made today: None  Labs/ tests ordered today include:  Orders Placed This Encounter  Procedures  . EKG 12-Lead     Disposition:   FU with Cassius Cullinane 9 months  Signed, Maleko Greulich Jorja Loa, MD  01/17/2020 3:59 PM     [facility] [address] [place] [address] [phone] (office) [phone] (fax)

## 2020-01-25 MED FILL — FARXIGA 10 MG TABLET: 10 | 30 days supply | Qty: 30 | Fill #1

## 2020-01-29 MED FILL — SPIRONOLACTONE 25 MG TABS: 25 | 90 days supply | Qty: 90 | Fill #2

## 2020-01-29 MED FILL — BIDIL 20-37.5 MG TABS: 20-37.5 | 30 days supply | Qty: 90 | Fill #3

## 2020-01-29 MED FILL — CARVEDILOL 12.5 MG TABLET: 12.5 | 30 days supply | Qty: 60 | Fill #1

## 2020-01-29 MED FILL — ENTRESTO 97 MG-103 MG TAB: 97-103 | 30 days supply | Qty: 60 | Fill #6

## 2020-02-21 MED FILL — FARXIGA 10 MG TABLET: 10 | 30 days supply | Qty: 30 | Fill #2

## 2020-02-25 ENCOUNTER — Ambulatory Visit (INDEPENDENT_AMBULATORY_CARE_PROVIDER_SITE_OTHER): Payer: Medicare HMO

## 2020-02-25 DIAGNOSIS — I428 Other cardiomyopathies: Secondary | ICD-10-CM | POA: Diagnosis not present

## 2020-02-25 LAB — CUP PACEART REMOTE DEVICE CHECK
Battery Remaining Longevity: 66 mo
Battery Remaining Percentage: 85 %
Battery Voltage: 2.98 V
Brady Statistic AP VP Percent: 11 %
Brady Statistic AP VS Percent: 1 %
Brady Statistic AS VP Percent: 89 %
Brady Statistic AS VS Percent: 1 %
Brady Statistic RA Percent Paced: 11 %
Date Time Interrogation Session: 20211008020050
HighPow Impedance: 64 Ohm
Implantable Lead Implant Date: 20210409
Implantable Lead Implant Date: 20210409
Implantable Lead Implant Date: 20210409
Implantable Lead Location: 753858
Implantable Lead Location: 753859
Implantable Lead Location: 753860
Implantable Pulse Generator Implant Date: 20210409
Lead Channel Impedance Value: 390 Ohm
Lead Channel Impedance Value: 460 Ohm
Lead Channel Impedance Value: 550 Ohm
Lead Channel Pacing Threshold Amplitude: 0.5 V
Lead Channel Pacing Threshold Amplitude: 0.75 V
Lead Channel Pacing Threshold Amplitude: 2 V
Lead Channel Pacing Threshold Pulse Width: 0.5 ms
Lead Channel Pacing Threshold Pulse Width: 0.5 ms
Lead Channel Pacing Threshold Pulse Width: 1 ms
Lead Channel Sensing Intrinsic Amplitude: 1.6 mV
Lead Channel Sensing Intrinsic Amplitude: 11.9 mV
Lead Channel Setting Pacing Amplitude: 2 V
Lead Channel Setting Pacing Amplitude: 2 V
Lead Channel Setting Pacing Amplitude: 2.5 V
Lead Channel Setting Pacing Pulse Width: 0.5 ms
Lead Channel Setting Pacing Pulse Width: 1 ms
Lead Channel Setting Sensing Sensitivity: 0.5 mV
Pulse Gen Serial Number: 111006673

## 2020-02-29 NOTE — Progress Notes (Signed)
Remote ICD transmission.   

## 2020-03-02 MED FILL — CARVEDILOL 12.5 MG TABLET: 12.5 | 30 days supply | Qty: 60 | Fill #2

## 2020-03-02 MED FILL — ENTRESTO 97 MG-103 MG TAB: 97-103 | 30 days supply | Qty: 60 | Fill #7

## 2020-03-02 MED FILL — BIDIL 20-37.5 MG TABS: 20-37.5 | 30 days supply | Qty: 90 | Fill #4

## 2020-03-03 ENCOUNTER — Ambulatory Visit (HOSPITAL_COMMUNITY)
Admission: RE | Admit: 2020-03-03 | Discharge: 2020-03-03 | Disposition: A | Discharge status: Home / Self Care | Payer: Medicare HMO | Source: Ambulatory Visit | Attending: Internal Medicine | Admitting: Internal Medicine

## 2020-03-03 ENCOUNTER — Other Ambulatory Visit: Payer: Self-pay

## 2020-03-03 DIAGNOSIS — I5021 Acute systolic (congestive) heart failure: Secondary | ICD-10-CM | POA: Insufficient documentation

## 2020-03-03 LAB — BASIC METABOLIC PANEL
Anion gap: 7 (ref 5–15)
BUN: 18 mg/dL (ref 8–23)
CO2: 28 mmol/L (ref 22–32)
Calcium: 9.2 mg/dL (ref 8.9–10.3)
Chloride: 105 mmol/L (ref 98–111)
Creatinine, Ser: 0.88 mg/dL (ref 0.44–1.00)
GFR, Estimated: >60 mL/min (ref >60)
Glucose, Bld: 97 mg/dL (ref 70–99)
Potassium: 4 mmol/L (ref 3.5–5.1)
Sodium: 140 mmol/L (ref 135–145)

## 2020-03-22 MED FILL — FARXIGA 10 MG TABLET: 10 | 30 days supply | Qty: 30 | Fill #3

## 2020-03-31 MED FILL — ATORVASTATIN 40 MG TABLET: 40 | 90 days supply | Qty: 90 | Fill #3

## 2020-03-31 MED FILL — CARVEDILOL 12.5 MG TABLET: 12.5 | 30 days supply | Qty: 60 | Fill #3

## 2020-03-31 MED FILL — BIDIL 20-37.5 MG TABS: 20-37.5 | 30 days supply | Qty: 90 | Fill #5

## 2020-03-31 MED FILL — ENTRESTO 97 MG-103 MG TAB: 97-103 | 30 days supply | Qty: 60 | Fill #8

## 2020-04-10 LAB — COLOGUARD: COLOGUARD: NEGATIVE

## 2020-04-17 MED FILL — FARXIGA 10 MG TABLET: 10 | 30 days supply | Qty: 30 | Fill #4

## 2020-04-29 MED FILL — BIDIL 20-37.5 MG TABS: 20-37.5 | 30 days supply | Qty: 90 | Fill #6

## 2020-04-29 MED FILL — CARVEDILOL 12.5 MG TABLET: 12.5 | 30 days supply | Qty: 60 | Fill #4

## 2020-04-29 MED FILL — ENTRESTO 97 MG-103 MG TAB: 97-103 | 30 days supply | Qty: 60 | Fill #9

## 2020-04-29 MED FILL — SPIRONOLACTONE 25 MG TABS: 25 | 90 days supply | Qty: 90 | Fill #1

## 2020-05-23 MED FILL — FARXIGA 10 MG TABLET: 10 | 30 days supply | Qty: 30 | Fill #5

## 2020-05-26 ENCOUNTER — Ambulatory Visit (INDEPENDENT_AMBULATORY_CARE_PROVIDER_SITE_OTHER): Payer: Medicare HMO

## 2020-05-26 DIAGNOSIS — I428 Other cardiomyopathies: Secondary | ICD-10-CM

## 2020-05-26 LAB — CUP PACEART REMOTE DEVICE CHECK
Battery Remaining Longevity: 65 mo
Battery Remaining Percentage: 82 %
Battery Voltage: 2.98 V
Brady Statistic AP VP Percent: 13 %
Brady Statistic AP VS Percent: 1 %
Brady Statistic AS VP Percent: 87 %
Brady Statistic AS VS Percent: 1 %
Brady Statistic RA Percent Paced: 13 %
Date Time Interrogation Session: 20220107010250
HighPow Impedance: 62 Ohm
Implantable Lead Implant Date: 20210409
Implantable Lead Implant Date: 20210409
Implantable Lead Implant Date: 20210409
Implantable Lead Location: 753858
Implantable Lead Location: 753859
Implantable Lead Location: 753860
Implantable Pulse Generator Implant Date: 20210409
Lead Channel Impedance Value: 440 Ohm
Lead Channel Impedance Value: 460 Ohm
Lead Channel Impedance Value: 660 Ohm
Lead Channel Pacing Threshold Amplitude: 0.5 V
Lead Channel Pacing Threshold Amplitude: 0.75 V
Lead Channel Pacing Threshold Amplitude: 2 V
Lead Channel Pacing Threshold Pulse Width: 0.5 ms
Lead Channel Pacing Threshold Pulse Width: 0.5 ms
Lead Channel Pacing Threshold Pulse Width: 1 ms
Lead Channel Sensing Intrinsic Amplitude: 1.3 mV
Lead Channel Sensing Intrinsic Amplitude: 11.9 mV
Lead Channel Setting Pacing Amplitude: 2 V
Lead Channel Setting Pacing Amplitude: 2 V
Lead Channel Setting Pacing Amplitude: 2.5 V
Lead Channel Setting Pacing Pulse Width: 0.5 ms
Lead Channel Setting Pacing Pulse Width: 1 ms
Lead Channel Setting Sensing Sensitivity: 0.5 mV
Pulse Gen Serial Number: 111006673

## 2020-06-02 MED FILL — BIDIL 20-37.5 MG TABS: 20-37.5 | 30 days supply | Qty: 90 | Fill #7

## 2020-06-02 MED FILL — CARVEDILOL 12.5 MG TABLET: 12.5 | 30 days supply | Qty: 60 | Fill #5

## 2020-06-02 MED FILL — ENTRESTO 97 MG-103 MG TAB: 97-103 | 30 days supply | Qty: 60 | Fill #10

## 2020-06-09 NOTE — Progress Notes (Signed)
Remote ICD transmission.   

## 2020-06-20 ENCOUNTER — Other Ambulatory Visit (HOSPITAL_COMMUNITY): Payer: Self-pay | Admitting: Cardiology

## 2020-06-20 DIAGNOSIS — D696 Thrombocytopenia, unspecified: Secondary | ICD-10-CM | POA: Insufficient documentation

## 2020-06-21 ENCOUNTER — Other Ambulatory Visit (HOSPITAL_COMMUNITY): Payer: Self-pay | Admitting: Cardiology

## 2020-06-21 MED FILL — FARXIGA 10 MG TABLET: 10 | 30 days supply | Qty: 30 | Fill #0

## 2020-06-30 ENCOUNTER — Other Ambulatory Visit (HOSPITAL_COMMUNITY): Payer: Self-pay | Admitting: Cardiology

## 2020-06-30 MED FILL — CARVEDILOL 12.5 MG TABLET: 12.5 | 30 days supply | Qty: 60 | Fill #6

## 2020-06-30 MED FILL — ENTRESTO 97 MG-103 MG TAB: 97-103 | 30 days supply | Qty: 60 | Fill #11

## 2020-06-30 MED FILL — BIDIL 20-37.5 MG TABS: 20-37.5 | 30 days supply | Qty: 90 | Fill #8

## 2020-07-04 ENCOUNTER — Other Ambulatory Visit (HOSPITAL_COMMUNITY): Payer: Self-pay | Admitting: Cardiology

## 2020-07-04 MED FILL — ATORVASTATIN 40 MG TABLET: 40 | 90 days supply | Qty: 90 | Fill #0

## 2020-07-14 ENCOUNTER — Other Ambulatory Visit (HOSPITAL_COMMUNITY): Payer: Self-pay | Admitting: Adult Health

## 2020-07-14 MED FILL — FARXIGA 10 MG TABLET: 10 | 30 days supply | Qty: 30 | Fill #1

## 2020-07-17 ENCOUNTER — Other Ambulatory Visit (HOSPITAL_COMMUNITY): Payer: Self-pay | Admitting: Cardiology

## 2020-07-17 MED FILL — SPIRONOLACTONE 25 MG TABS: 25 | 90 days supply | Qty: 90 | Fill #0

## 2020-07-28 ENCOUNTER — Other Ambulatory Visit (HOSPITAL_COMMUNITY): Payer: Self-pay | Admitting: Cardiology

## 2020-07-28 MED FILL — CARVEDILOL 12.5 MG TABLET: 12.5 | 30 days supply | Qty: 60 | Fill #7

## 2020-07-28 MED FILL — BIDIL 20-37.5 MG TABS: 20-37.5 | 30 days supply | Qty: 90 | Fill #9

## 2020-07-31 ENCOUNTER — Other Ambulatory Visit (HOSPITAL_COMMUNITY): Payer: Self-pay | Admitting: Cardiology

## 2020-08-21 ENCOUNTER — Other Ambulatory Visit (HOSPITAL_COMMUNITY): Payer: Self-pay

## 2020-08-21 ENCOUNTER — Other Ambulatory Visit (HOSPITAL_COMMUNITY): Payer: Self-pay | Admitting: Cardiology

## 2020-08-21 MED ORDER — DAPAGLIFLOZIN PROPANEDIOL 10 MG PO TABS
10.0000 mg | ORAL_TABLET | Freq: Every day | ORAL | 2 refills | Status: DC
  Filled 2020-08-21: qty 30, 30d supply, fill #0
  Filled 2020-09-14: qty 30, 30d supply, fill #1
  Filled 2020-10-17: qty 30, 30d supply, fill #2

## 2020-08-25 ENCOUNTER — Ambulatory Visit (INDEPENDENT_AMBULATORY_CARE_PROVIDER_SITE_OTHER): Payer: Medicare HMO

## 2020-08-25 DIAGNOSIS — I428 Other cardiomyopathies: Secondary | ICD-10-CM | POA: Diagnosis not present

## 2020-08-25 LAB — CUP PACEART REMOTE DEVICE CHECK
Battery Remaining Longevity: 64 mo
Battery Remaining Percentage: 79 %
Battery Voltage: 2.98 V
Brady Statistic AP VP Percent: 14 %
Brady Statistic AP VS Percent: 1 %
Brady Statistic AS VP Percent: 86 %
Brady Statistic AS VS Percent: 1 %
Brady Statistic RA Percent Paced: 14 %
Date Time Interrogation Session: 20220408030456
HighPow Impedance: 69 Ohm
Implantable Lead Implant Date: 20210409
Implantable Lead Implant Date: 20210409
Implantable Lead Implant Date: 20210409
Implantable Lead Location: 753858
Implantable Lead Location: 753859
Implantable Lead Location: 753860
Implantable Pulse Generator Implant Date: 20210409
Lead Channel Impedance Value: 390 Ohm
Lead Channel Impedance Value: 460 Ohm
Lead Channel Impedance Value: 540 Ohm
Lead Channel Pacing Threshold Amplitude: 0.5 V
Lead Channel Pacing Threshold Amplitude: 0.75 V
Lead Channel Pacing Threshold Amplitude: 2 V
Lead Channel Pacing Threshold Pulse Width: 0.5 ms
Lead Channel Pacing Threshold Pulse Width: 0.5 ms
Lead Channel Pacing Threshold Pulse Width: 1 ms
Lead Channel Sensing Intrinsic Amplitude: 11.9 mV
Lead Channel Sensing Intrinsic Amplitude: 2.6 mV
Lead Channel Setting Pacing Amplitude: 2 V
Lead Channel Setting Pacing Amplitude: 2 V
Lead Channel Setting Pacing Amplitude: 2.5 V
Lead Channel Setting Pacing Pulse Width: 0.5 ms
Lead Channel Setting Pacing Pulse Width: 1 ms
Lead Channel Setting Sensing Sensitivity: 0.5 mV
Pulse Gen Serial Number: 111006673

## 2020-08-30 ENCOUNTER — Telehealth: Payer: Self-pay

## 2020-08-30 ENCOUNTER — Other Ambulatory Visit (HOSPITAL_COMMUNITY): Payer: Self-pay

## 2020-08-30 MED FILL — Isosorbide Dinitrate-Hydralazine HCl Tab 20-37.5 MG: ORAL | 30 days supply | Qty: 90 | Fill #0 | Status: AC

## 2020-08-30 NOTE — Telephone Encounter (Signed)
-----   Message from Will Jorja Loa, MD sent at 08/25/2020  4:26 PM EDT ----- Abnormal device interrogation reviewed.  Lead parameters and battery status stable.  Needs appt to adjust sensitivity/blanking for AMS.

## 2020-08-30 NOTE — Telephone Encounter (Signed)
Attempted to reach pt, no answer.  Left VM requesting callback to discuss results.

## 2020-09-01 ENCOUNTER — Other Ambulatory Visit (HOSPITAL_COMMUNITY): Payer: Self-pay

## 2020-09-01 MED FILL — Sacubitril-Valsartan Tab 97-103 MG: ORAL | 30 days supply | Qty: 60 | Fill #0 | Status: AC

## 2020-09-01 MED FILL — Carvedilol Tab 12.5 MG: ORAL | 30 days supply | Qty: 60 | Fill #0 | Status: AC

## 2020-09-07 NOTE — Progress Notes (Signed)
Remote ICD transmission.   

## 2020-09-13 NOTE — Telephone Encounter (Signed)
Attempted to reach patient. Received a message that patient's mailbox was full. Unable to leave a message. Previous voicemail left for patient.

## 2020-09-14 ENCOUNTER — Other Ambulatory Visit (HOSPITAL_COMMUNITY): Payer: Self-pay

## 2020-09-20 DIAGNOSIS — I428 Other cardiomyopathies: Secondary | ICD-10-CM | POA: Insufficient documentation

## 2020-09-25 NOTE — Telephone Encounter (Signed)
Successful telephone encounter to Ancil Linsey to schedule device clinic appointment per Dr. Elberta Fortis to adjust sensitivity/blanking for AMS. Patient will present to device clinic 10/10/20 at 10:00am. Industry notified.

## 2020-09-28 ENCOUNTER — Other Ambulatory Visit (HOSPITAL_COMMUNITY): Payer: Self-pay

## 2020-09-28 ENCOUNTER — Other Ambulatory Visit (HOSPITAL_BASED_OUTPATIENT_CLINIC_OR_DEPARTMENT_OTHER): Payer: Self-pay

## 2020-09-28 MED FILL — Atorvastatin Calcium Tab 40 MG (Base Equivalent): ORAL | 90 days supply | Qty: 90 | Fill #0 | Status: AC

## 2020-09-28 MED FILL — Carvedilol Tab 12.5 MG: ORAL | 30 days supply | Qty: 60 | Fill #1 | Status: AC

## 2020-09-28 MED FILL — Sacubitril-Valsartan Tab 97-103 MG: ORAL | 30 days supply | Qty: 60 | Fill #1 | Status: AC

## 2020-09-28 MED FILL — Isosorbide Dinitrate-Hydralazine HCl Tab 20-37.5 MG: ORAL | 30 days supply | Qty: 90 | Fill #1 | Status: AC

## 2020-10-10 ENCOUNTER — Telehealth: Payer: Self-pay | Admitting: Emergency Medicine

## 2020-10-10 ENCOUNTER — Other Ambulatory Visit: Payer: Self-pay

## 2020-10-10 ENCOUNTER — Ambulatory Visit (INDEPENDENT_AMBULATORY_CARE_PROVIDER_SITE_OTHER): Payer: Medicare HMO | Admitting: Emergency Medicine

## 2020-10-10 DIAGNOSIS — I428 Other cardiomyopathies: Secondary | ICD-10-CM

## 2020-10-10 DIAGNOSIS — I5022 Chronic systolic (congestive) heart failure: Secondary | ICD-10-CM

## 2020-10-10 NOTE — Telephone Encounter (Signed)
Confirmed patient's appointment with Dr. Elberta Fortis at 2:30 pm on 11/13/20.

## 2020-10-12 ENCOUNTER — Telehealth: Payer: Self-pay | Admitting: Cardiology

## 2020-10-12 NOTE — Telephone Encounter (Signed)
Patient c/o Palpitations:  High priority if patient c/o lightheadedness, shortness of breath, or chest pain  1) How long have you had palpitations/irregular HR/ Afib? Are you having the symptoms now? Since Tuesday   2) Are you currently experiencing lightheadedness, SOB or CP? No  3) Do you have a history of afib (atrial fibrillation) or irregular heart rhythm? Unknown  4) Have you checked your BP or HR? (document readings if available): No  5) Are you experiencing any other symptoms? Pt is callig with concerns her heart is beating fast.

## 2020-10-12 NOTE — Telephone Encounter (Signed)
Spoke with pt, she reports she has had increased SOB and fatigue since Tuesday.  She was seen in office on that day and ever since visit she has not felt well.  She denies any recent weight gain or edema.  Pt reports she can feel her heart pounding near her abdomen.  Patient BP cuff not working so she is unable to provide current BP.    Pt confirms compliance with medications as ordered.  Current meds include Spironolactone 25mg  daily, Furosemide 20mg  daily Entresto 97-103mg  BID, Carvedilol 12.5mg  BID  Patient sent a manual transmission.  Device appears to be functioning appropriately.  No arrythmias logged and Corvue currently at threshold.    Offered to have patient come in today for Nurse visit with industry or come tomorrow to see PA.  Pt elected to see PA tomorrow, scheduled with A. Tillery at 12:10pm.     Advised of ED precautions if SOB or condition worsens.

## 2020-10-12 NOTE — Progress Notes (Signed)
CRT-D device check in office. Thresholds and sensing consistent with previous device measurements. Lead impedance trends stable over time. No mode switch episodes recorded. No ventricular arrhythmia episodes recorded. AT/AF burden <1%. Patient bi-ventricularly pacing > 99% of the time. Device programmed with appropriate safety margins. Heart failure diagnostics reviewed and trends are stable for patient. Estimated longevity 5 years, 5 months. Next remote 11/24/20.  Changes made: LV Pulse Configuration initial Proximal 4-RV Coil programmed to Mid 2-RV Coil                        PVAB initial 90 ms  programmed to 

## 2020-10-12 NOTE — Progress Notes (Deleted)
Electrophysiology Office Note Date: 10/12/2020  ID:  [facility], DOB 04/27/1946, MRN [id number]  PCP: Drosinis, [facility], PA-C Primary Cardiologist: None Electrophysiologist: Will Jorja Loa, MD   CC: Routine ICD follow-up  An [facility] is a 75 y.o. female seen today for Will Jorja Loa, MD for acute visit due to device issues -> likely diaphragmatic stim.    Seen in device clinic 10/10/20 with abnormal device remote. LV vector changed from P4 to RV coil to M2 to RV coil  Since adjustment made 5/24, pt has had SOB and hard palpitations she described as "heart pounding near her abdomen". With concern for diaphragmatic stim patient brought back in today.   Since {Blank single:19197::"last being seen in our clinic","discharge from hospital"} the patient reports doing ***.  she denies chest pain, palpitations, dyspnea, PND, orthopnea, nausea, vomiting, dizziness, syncope, edema, weight gain, or early satiety. {He/she (caps):30048} has not had ICD shocks.   Device History: St. Jude BiV ICD implanted 08/27/2019 for chronic systolic CHF / NICM  Past Medical History:  Diagnosis Date  . Coronary artery disease   . Hypertension    Past Surgical History:  Procedure Laterality Date  . BIV ICD INSERTION CRT-D N/A 08/27/2019   Procedure: BIV ICD INSERTION CRT-D;  Surgeon: Regan Lemming, MD;  Location: [facility] INVASIVE CV LAB;  Service: Cardiovascular;  Laterality: N/A;  . RIGHT/LEFT HEART CATH AND CORONARY ANGIOGRAPHY N/A 03/16/2019   Procedure: RIGHT/LEFT HEART CATH AND CORONARY ANGIOGRAPHY;  Surgeon: Lyn Records, MD;  Location: [facility] INVASIVE CV LAB;  Service: Cardiovascular;  Laterality: N/A;    Current Outpatient Medications  Medication Sig Dispense Refill  . alendronate (FOSAMAX) 70 MG tablet Take 70 mg by mouth once a week. Every Monday    . atorvastatin (LIPITOR) 40 MG tablet TAKE 1 TABLET BY MOUTH ONCE A DAY AT 6PM 90 tablet 3  . carvedilol (COREG) 12.5 MG tablet TAKE 1  TABLET BY MOUTH TWO TIMES DAILY 60 tablet 11  . dapagliflozin propanediol (FARXIGA) 10 MG TABS tablet Take 1 tablet (10 mg total) by mouth daily before breakfast. Needs appt for further refills 30 tablet 2  . diphenhydrAMINE (SOMINEX) 25 MG tablet Take 1 tablet by mouth daily as needed for itching.     . furosemide (LASIX) 20 MG tablet Take 1 tablet (20 mg total) by mouth as needed for fluid. 90 tablet 3  . isosorbide-hydrALAZINE (BIDIL) 20-37.5 MG tablet TAKE 1 TABLET BY MOUTH 3 TIMES DAILY 90 tablet 11  . Multiple Vitamins-Minerals (PRESERVISION AREDS 2 PO) Take 1 capsule by mouth daily.    . potassium chloride (KLOR-CON) 10 MEQ tablet TAKE 1 TABLET BY MOUTH WHEN YOU TAKE LASIX 90 tablet 1  . sacubitril-valsartan (ENTRESTO) 97-103 MG TAKE 1 TABLET BY MOUTH 2 TIMES DAILY (NEEDS APPT) 60 tablet 2  . spironolactone (ALDACTONE) 25 MG tablet TAKE 1 TABLET BY MOUTH ONCE A DAY 90 tablet 3   No current facility-administered medications for this visit.    Allergies:   Aspirin   Social History: Social History   Socioeconomic History  . Marital status: Widowed    Spouse name: Not on file  . Number of children: Not on file  . Years of education: Not on file  . Highest education level: Not on file  Occupational History  . Not on file  Tobacco Use  . Smoking status: Current Some Day Smoker  . Smokeless tobacco: Never Used  Vaping Use  . Vaping Use: Never used  Substance and Sexual Activity  . Alcohol use: No  . Drug use: Never  . Sexual activity: Not Currently  Other Topics Concern  . Not on file  Social History Narrative  . Not on file   Social Determinants of Health   Financial Resource Strain: Not on file  Food Insecurity: Not on file  Transportation Needs: Not on file  Physical Activity: Not on file  Stress: Not on file  Social Connections: Not on file  Intimate Partner Violence: Not on file    Family History: Family History  Problem Relation Age of Onset  . Hypertension  Mother   . Diabetes Mother   . Diabetes Brother   . Alcoholism Father   . Diabetes type I Grandson     Review of Systems: All other systems reviewed and are otherwise negative except as noted above.   Physical Exam: There were no vitals filed for this visit.   GEN- The patient is well appearing, alert and oriented x 3 today.   HEENT: normocephalic, atraumatic; sclera clear, conjunctiva pink; hearing intact; oropharynx clear; neck supple, no JVP Lymph- no cervical lymphadenopathy Lungs- Clear to ausculation bilaterally, normal work of breathing.  No wheezes, rales, rhonchi Heart- Regular rate and rhythm, no murmurs, rubs or gallops, PMI not laterally displaced GI- soft, non-tender, non-distended, bowel sounds present, no hepatosplenomegaly Extremities- no clubbing or cyanosis. No edema; DP/PT/radial pulses 2+ bilaterally MS- no significant deformity or atrophy Skin- warm and dry, no rash or lesion; ICD pocket well healed Psych- euthymic mood, full affect Neuro- strength and sensation are intact  ICD interrogation- reviewed in detail today,  See PACEART report  EKG:  EKG {ACTION; IS/IS [id number]} ordered today. The ekg ordered today shows ***  Recent Labs: 03/03/2020: BUN 18; Creatinine, Ser 0.88; Potassium 4.0; Sodium 140   Wt Readings from Last 3 Encounters:  01/17/20 165 lb (74.8 kg)  12/02/19 164 lb (74.4 kg)  11/10/19 162 lb 3.2 oz (73.6 kg)     Other studies Reviewed: Additional studies/ records that were reviewed today include: ***   Assessment and Plan:  1.  Chronic systolic dysfunction s/p St. Jude CRT-D  euvolemic today Stable on an appropriate medical regimen Normal ICD function See Pace Art report No changes today   Current medicines are reviewed at length with the patient today.   The patient {ACTIONS; HAS/DOES NOT HAVE:19233} concerns regarding her medicines.  The following changes were made today:  {NONE DEFAULTED:18576::"none"}  Labs/ tests  ordered today include: *** No orders of the defined types were placed in this encounter.    Disposition:   Follow up with {Blank single:19197::"Dr. Allred","Dr. Amada Jupiter. Klein","Dr. Camnitz","Dr. Lambert","EP APP"}  {gen number [id number]} {TIME; UNITS DAY/WEEK/MONTH:19136}   Signed, Graciella Freer, PA-C  10/12/2020 5:12 PM  [facility] [address] [place] [address] [phone] (office) [phone] (fax)

## 2020-10-13 ENCOUNTER — Other Ambulatory Visit: Payer: Self-pay

## 2020-10-13 ENCOUNTER — Encounter: Payer: Medicare HMO | Admitting: Student

## 2020-10-13 ENCOUNTER — Ambulatory Visit (INDEPENDENT_AMBULATORY_CARE_PROVIDER_SITE_OTHER): Payer: Medicare HMO | Admitting: Emergency Medicine

## 2020-10-13 ENCOUNTER — Encounter: Payer: Self-pay | Admitting: Student

## 2020-10-13 DIAGNOSIS — I5022 Chronic systolic (congestive) heart failure: Secondary | ICD-10-CM | POA: Diagnosis not present

## 2020-10-13 DIAGNOSIS — I428 Other cardiomyopathies: Secondary | ICD-10-CM

## 2020-10-13 NOTE — Progress Notes (Signed)
CRT-D device check in office. Thresholds and sensing consistent with previous device measurements. Lead impedance trends stable over time. No mode switch episodes recorded. No ventricular arrhythmia episodes recorded. Patient bi-ventricularly pacing > 99% of the time. Device programmed with appropriate safety margins. Heart failure diagnostics reviewed and trends are stable for patient.  Estimated longevity 5 years, 9 months. Next remote on 11/24/20.  See attached PDF for changes. Device optimized by industry. Patient seen in clinic for phrenetic nerve stimulation.

## 2020-10-17 ENCOUNTER — Other Ambulatory Visit: Payer: Self-pay | Admitting: Cardiology

## 2020-10-17 ENCOUNTER — Other Ambulatory Visit (HOSPITAL_COMMUNITY): Payer: Self-pay

## 2020-10-17 MED FILL — Spironolactone Tab 25 MG: ORAL | 90 days supply | Qty: 90 | Fill #0 | Status: AC

## 2020-10-30 ENCOUNTER — Other Ambulatory Visit (HOSPITAL_COMMUNITY): Payer: Self-pay | Admitting: Cardiology

## 2020-10-30 ENCOUNTER — Other Ambulatory Visit (HOSPITAL_COMMUNITY): Payer: Self-pay

## 2020-10-30 DIAGNOSIS — I5022 Chronic systolic (congestive) heart failure: Secondary | ICD-10-CM

## 2020-10-30 MED FILL — Carvedilol Tab 12.5 MG: ORAL | 30 days supply | Qty: 60 | Fill #2 | Status: AC

## 2020-10-31 ENCOUNTER — Other Ambulatory Visit (HOSPITAL_COMMUNITY): Payer: Self-pay | Admitting: Cardiology

## 2020-10-31 ENCOUNTER — Other Ambulatory Visit (HOSPITAL_COMMUNITY): Payer: Self-pay

## 2020-10-31 DIAGNOSIS — I5022 Chronic systolic (congestive) heart failure: Secondary | ICD-10-CM

## 2020-11-01 ENCOUNTER — Other Ambulatory Visit (HOSPITAL_COMMUNITY): Payer: Self-pay | Admitting: *Deleted

## 2020-11-01 ENCOUNTER — Other Ambulatory Visit (HOSPITAL_COMMUNITY): Payer: Self-pay

## 2020-11-01 DIAGNOSIS — I5022 Chronic systolic (congestive) heart failure: Secondary | ICD-10-CM

## 2020-11-01 MED ORDER — ATORVASTATIN CALCIUM 40 MG PO TABS
ORAL_TABLET | ORAL | 3 refills | Status: DC
Start: 2020-11-01 — End: 2021-12-31
  Filled 2020-11-01: qty 90, fill #0
  Filled 2021-01-01: qty 90, 90d supply, fill #0
  Filled 2021-03-30: qty 90, 90d supply, fill #1
  Filled 2021-07-02: qty 90, 90d supply, fill #2
  Filled 2021-09-29: qty 90, 90d supply, fill #3

## 2020-11-01 MED ORDER — ISOSORB DINITRATE-HYDRALAZINE 20-37.5 MG PO TABS
1.0000 | ORAL_TABLET | Freq: Three times a day (TID) | ORAL | 11 refills | Status: DC
  Filled 2020-11-01 (×2): qty 90, 30d supply, fill #0
  Filled 2020-12-02: qty 90, 30d supply, fill #1
  Filled 2021-01-01: qty 90, 30d supply, fill #2
  Filled 2021-01-27 (×2): qty 90, 30d supply, fill #3
  Filled 2021-03-02: qty 90, 30d supply, fill #4

## 2020-11-01 MED ORDER — CARVEDILOL 12.5 MG PO TABS
ORAL_TABLET | Freq: Two times a day (BID) | ORAL | 11 refills | Status: DC
Start: 2020-11-01 — End: 2021-11-09
  Filled 2020-11-01: qty 60, fill #0
  Filled 2020-12-02: qty 60, 30d supply, fill #0
  Filled 2021-01-01: qty 60, 30d supply, fill #1
  Filled 2021-01-27: qty 60, 30d supply, fill #2
  Filled 2021-03-02: qty 60, 30d supply, fill #3
  Filled 2021-03-30: qty 60, 30d supply, fill #4
  Filled 2021-04-27: qty 60, 30d supply, fill #5
  Filled 2021-05-28: qty 60, 30d supply, fill #6
  Filled 2021-06-04: qty 60, 30d supply, fill #7
  Filled 2021-08-06: qty 60, 30d supply, fill #8
  Filled 2021-09-01: qty 60, 30d supply, fill #9
  Filled 2021-10-08: qty 60, 30d supply, fill #10
  Filled 2021-11-09: qty 8, 4d supply, fill #11

## 2020-11-01 MED ORDER — SACUBITRIL-VALSARTAN 97-103 MG PO TABS
ORAL_TABLET | ORAL | 2 refills | Status: DC
  Filled 2020-11-01: qty 60, 30d supply, fill #0
  Filled 2020-12-02: qty 60, 30d supply, fill #1
  Filled 2021-01-01: qty 60, 30d supply, fill #2

## 2020-11-01 MED ORDER — FUROSEMIDE 20 MG PO TABS
20.0000 mg | ORAL_TABLET | ORAL | 3 refills | Status: DC | PRN
  Filled 2020-11-01: qty 90, 90d supply, fill #0

## 2020-11-01 MED ORDER — DAPAGLIFLOZIN PROPANEDIOL 10 MG PO TABS
10.0000 mg | ORAL_TABLET | Freq: Every day | ORAL | 2 refills | Status: DC
  Filled 2020-11-01 – 2021-02-13 (×2): qty 30, 30d supply, fill #0
  Filled 2021-03-16: qty 30, 30d supply, fill #1
  Filled 2021-04-13: qty 30, 30d supply, fill #2

## 2020-11-01 MED ORDER — SPIRONOLACTONE 25 MG PO TABS
ORAL_TABLET | Freq: Every day | ORAL | 3 refills | Status: DC
  Filled 2020-11-01: qty 90, fill #0
  Filled 2021-01-13: qty 90, 90d supply, fill #0
  Filled 2021-04-13: qty 90, 90d supply, fill #1

## 2020-11-13 ENCOUNTER — Ambulatory Visit (INDEPENDENT_AMBULATORY_CARE_PROVIDER_SITE_OTHER): Payer: Medicare HMO | Admitting: Cardiology

## 2020-11-13 ENCOUNTER — Other Ambulatory Visit: Payer: Self-pay

## 2020-11-13 ENCOUNTER — Encounter: Payer: Self-pay | Admitting: Cardiology

## 2020-11-13 VITALS — BP 126/78 | HR 63 | Ht 67.0 in | Wt 169.0 lb

## 2020-11-13 DIAGNOSIS — I428 Other cardiomyopathies: Secondary | ICD-10-CM | POA: Diagnosis not present

## 2020-11-13 NOTE — Patient Instructions (Signed)
Medication Instructions:  Your physician recommends that you continue on your current medications as directed. Please refer to the Current Medication list given to you today.  *If you need a refill on your cardiac medications before your next appointment, please call your pharmacy*   Lab Work: None ordered   Testing/Procedures: None ordered   Follow-Up: At [facility], you and your health needs are our priority.  As part of our continuing mission to provide you with exceptional heart care, we have created designated Provider Care Teams.  These Care Teams include your primary Cardiologist (physician) and Advanced Practice Providers (APPs -  Physician Assistants and Nurse Practitioners) who all work together to provide you with the care you need, when you need it.   Remote monitoring is used to monitor your Pacemaker or ICD from home. This monitoring reduces the number of office visits required to check your device to one time per year. It allows [place] to keep an eye on the functioning of your device to ensure it is working properly. You are scheduled for a device check from home on 11/24/20. You may send your transmission at any time that day. If you have a wireless device, the transmission will be sent automatically. After your physician reviews your transmission, you will receive a postcard with your next transmission date.  Your next appointment:   1 year(s)  The format for your next appointment:   In Person  Provider:   Loman Brooklyn, MD   Thank you for choosing [facility]!!   Dory Horn, RN [phone]

## 2020-11-13 NOTE — Progress Notes (Signed)
Electrophysiology Office Note   Date:  11/13/2020   ID:  Ann Bernard, DOB 14-Aug-1945, MRN [id number]  PCP:  Drosinis, Leonia Reader, PA-C  Cardiologist:  Shirlee Latch Primary Electrophysiologist:  Montserrath Madding Jorja Loa, MD    Chief Complaint: CHF   History of Present Illness: Ann Bernard is a 75 y.o. female who is being seen today for the evaluation of CHF at the request of Drosinis, Leonia Reader, PA-C. Presenting today for electrophysiology evaluation.  She has a history significant for hypertension, hyperlipidemia, tobacco abuse, chronic systolic heart failure.  He presented about his current hospital December 2020 with acute systolic diagnostic catheterization was performed showed mild coronary artery disease.  She was on optimal medical therapy.  Repeat echo showed a persistently low ejection fraction.  She is now status post Abbott CRT-D implanted 08/27/2019.  Fortunately repeat echo shows improvement in ejection fraction to 50%.  Today, denies symptoms of palpitations, chest pain, shortness of breath, orthopnea, PND, lower extremity edema, claudication, dizziness, presyncope, syncope, bleeding, or neurologic sequela. The patient is tolerating medications without difficulties.  She has been fairly well.  She has no chest pain or shortness of breath.  She is able to all of her daily activities complaint is some mild tenderness over her device site.  Otherwise she has been doing well.   Past Medical History:  Diagnosis Date   Coronary artery disease    Hypertension    Past Surgical History:  Procedure Laterality Date   BIV ICD INSERTION CRT-D N/A 08/27/2019   Procedure: BIV ICD INSERTION CRT-D;  Surgeon: Regan Lemming, MD;  Location: [facility] INVASIVE CV LAB;  Service: Cardiovascular;  Laterality: N/A;   RIGHT/LEFT HEART CATH AND CORONARY ANGIOGRAPHY N/A 03/16/2019   Procedure: RIGHT/LEFT HEART CATH AND CORONARY ANGIOGRAPHY;  Surgeon: Lyn Records, MD;  Location: [facility] INVASIVE CV LAB;  Service:  Cardiovascular;  Laterality: N/A;     Current Outpatient Medications  Medication Sig Dispense Refill   alendronate (FOSAMAX) 70 MG tablet Take 70 mg by mouth once a week. Every Monday     atorvastatin (LIPITOR) 40 MG tablet TAKE 1 TABLET BY MOUTH ONCE A DAY AT 6PM 90 tablet 3   carvedilol (COREG) 12.5 MG tablet TAKE 1 TABLET BY MOUTH TWO TIMES DAILY 60 tablet 11   dapagliflozin propanediol (FARXIGA) 10 MG TABS tablet Take 1 tablet (10 mg total) by mouth daily before breakfast. Needs appt for further refills 30 tablet 2   diphenhydrAMINE (SOMINEX) 25 MG tablet Take 1 tablet by mouth daily as needed for itching.      furosemide (LASIX) 20 MG tablet Take 1 tablet (20 mg total) by mouth as needed for fluid. 90 tablet 3   isosorbide-hydrALAZINE (BIDIL) 20-37.5 MG tablet TAKE 1 TABLET BY MOUTH 3 TIMES DAILY 90 tablet 11   Multiple Vitamins-Minerals (PRESERVISION AREDS 2 PO) Take 1 capsule by mouth daily.     potassium chloride (KLOR-CON) 10 MEQ tablet TAKE 1 TABLET BY MOUTH WHEN YOU TAKE LASIX 90 tablet 1   sacubitril-valsartan (ENTRESTO) 97-103 MG TAKE 1 TABLET BY MOUTH 2 TIMES DAILY (NEEDS APPT) 60 tablet 2   spironolactone (ALDACTONE) 25 MG tablet TAKE 1 TABLET BY MOUTH ONCE A DAY 90 tablet 3   No current facility-administered medications for this visit.    Allergies:   Aspirin   Social History:  The patient  reports that she has been smoking. She has never used smokeless tobacco. She reports that she does not drink alcohol and does not  use drugs.   Family History:  The patient's family history includes Alcoholism in her father; Diabetes in her brother and mother; Diabetes type I in her grandson; Hypertension in her mother.   ROS:  Please see the history of present illness.   Otherwise, review of systems is positive for none.   All other systems are reviewed and negative.   PHYSICAL EXAM: VS:  BP 126/78   Pulse 63   Ht 5\' 7"  (1.702 m)   Wt 169 lb (76.7 kg)   SpO2 99%   BMI 26.47 kg/m   , BMI Body mass index is 26.47 kg/m. GEN: Well nourished, well developed, in no acute distress  HEENT: normal  Neck: no JVD, carotid bruits, or masses Cardiac: RRR; no murmurs, rubs, or gallops,no edema  Respiratory:  clear to auscultation bilaterally, normal work of breathing GI: soft, nontender, nondistended, + BS MS: no deformity or atrophy  Skin: warm and dry, device site well healed Neuro:  Strength and sensation are intact Psych: euthymic mood, full affect  EKG:  EKG is ordered today. Personal review of the ekg ordered shows AV paced  Personal review of the device interrogation today. Results in Paceart   Recent Labs: 03/03/2020: BUN 18; Creatinine, Ser 0.88; Potassium 4.0; Sodium 140    Lipid Panel     Component Value Date/Time   CHOL 142 09/01/2019 1143   TRIG 58 09/01/2019 1143   HDL 66 09/01/2019 1143   CHOLHDL 2.2 09/01/2019 1143   VLDL 12 09/01/2019 1143   LDLCALC 64 09/01/2019 1143   LDLDIRECT 138.3 01/25/2009 1524     Wt Readings from Last 3 Encounters:  11/13/20 169 lb (76.7 kg)  01/17/20 165 lb (74.8 kg)  12/02/19 164 lb (74.4 kg)      Other studies Reviewed: Additional studies/ records that were reviewed today include: TTE 12/02/2019 Review of the above records today demonstrates:   1. Left ventricular ejection fraction, by estimation, is 50%. The left  ventricle has mildly decreased function. The left ventricle demonstrates  global hypokinesis. There is mild left ventricular hypertrophy. Left  ventricular diastolic parameters are  consistent with Grade I diastolic dysfunction (impaired relaxation).   2. Right ventricular systolic function is normal. The right ventricular  size is normal. There is normal pulmonary artery systolic pressure. The  estimated right ventricular systolic pressure is 20.1 mmHg.   3. Left atrial size was mildly dilated.   4. The mitral valve is normal in structure. No evidence of mitral valve  regurgitation. No evidence  of mitral stenosis.   5. The aortic valve is tricuspid. Aortic valve regurgitation is trivial.  Mild aortic valve sclerosis is present, with no evidence of aortic valve  stenosis.   6. The inferior vena cava is normal in size with greater than 50%  respiratory variability, suggesting right atrial pressure of 3 mmHg.   RHC/LHC 03/16/19 Right dominant coronary anatomy. Normal left main. Widely patent LAD with each of 2 diagonals having 50% ostial narrowing. Widely patent circumflex. Minimal luminal irregularities in the proximal mid and distal RCA. Elevated left ventricular end-diastolic pressure, greater than 20 mmHg. Mild pulmonary hypertension with mean pressure 33 mmHg.  Pulmonary capillary wedge mean 23 mmHg.  V wave 33 mmHg.   ASSESSMENT AND PLAN:  1.  Chronic systolic heart failure due to nonischemic cardiomyopathy: Currently on optimal medical therapy with carvedilol, Entresto, Aldactone.  Status post Abbott CRT-D implanted 12/02/2019.  Device functioning appropriately.  No changes.  No signs of infection  at device site.  2.  Nonobstructive coronary artery disease: No current chest pain.  Continue atorvastatin.  3.  Tobacco abuse: Has quit smoking  Current medicines are reviewed at length with the patient today.   The patient does not have concerns regarding her medicines.  The following changes were made today:   Labs/ tests ordered today include:  No orders of the defined types were placed in this encounter.    Disposition:   FU with Ben Habermann 12 months  Signed, Mistie Adney Jorja Loa, MD  11/13/2020 3:09 PM     [facility] [address] [place] [address] [phone] (office) [phone] (fax)

## 2020-11-14 ENCOUNTER — Other Ambulatory Visit (HOSPITAL_COMMUNITY): Payer: Self-pay | Admitting: Cardiology

## 2020-11-14 ENCOUNTER — Other Ambulatory Visit (HOSPITAL_COMMUNITY): Payer: Self-pay

## 2020-11-15 ENCOUNTER — Other Ambulatory Visit (HOSPITAL_COMMUNITY): Payer: Self-pay

## 2020-11-16 ENCOUNTER — Other Ambulatory Visit (HOSPITAL_COMMUNITY): Payer: Self-pay | Admitting: Cardiology

## 2020-11-16 ENCOUNTER — Other Ambulatory Visit (HOSPITAL_COMMUNITY): Payer: Self-pay

## 2020-11-16 ENCOUNTER — Other Ambulatory Visit (HOSPITAL_COMMUNITY): Payer: Self-pay | Admitting: *Deleted

## 2020-11-16 MED ORDER — DAPAGLIFLOZIN PROPANEDIOL 10 MG PO TABS
10.0000 mg | ORAL_TABLET | Freq: Every day | ORAL | 2 refills | Status: DC
  Filled 2020-11-16: qty 30, 30d supply, fill #0
  Filled 2020-12-11: qty 30, 30d supply, fill #1
  Filled 2021-01-13: qty 30, 30d supply, fill #2

## 2020-11-24 ENCOUNTER — Ambulatory Visit (INDEPENDENT_AMBULATORY_CARE_PROVIDER_SITE_OTHER): Payer: Medicare HMO

## 2020-11-24 DIAGNOSIS — I428 Other cardiomyopathies: Secondary | ICD-10-CM

## 2020-11-26 LAB — CUP PACEART REMOTE DEVICE CHECK
Battery Remaining Longevity: 71 mo
Battery Remaining Percentage: 75 %
Battery Voltage: 2.98 V
Brady Statistic AP VP Percent: 16 %
Brady Statistic AP VS Percent: 1 %
Brady Statistic AS VP Percent: 82 %
Brady Statistic AS VS Percent: 1 %
Brady Statistic RA Percent Paced: 14 %
Date Time Interrogation Session: 20220708022410
HighPow Impedance: 59 Ohm
Implantable Lead Implant Date: 20210409
Implantable Lead Implant Date: 20210409
Implantable Lead Implant Date: 20210409
Implantable Lead Location: 753858
Implantable Lead Location: 753859
Implantable Lead Location: 753860
Implantable Pulse Generator Implant Date: 20210409
Lead Channel Impedance Value: 450 Ohm
Lead Channel Impedance Value: 580 Ohm
Lead Channel Impedance Value: 610 Ohm
Lead Channel Pacing Threshold Amplitude: 0.5 V
Lead Channel Pacing Threshold Amplitude: 0.625 V
Lead Channel Pacing Threshold Amplitude: 1.375 V
Lead Channel Pacing Threshold Pulse Width: 0.5 ms
Lead Channel Pacing Threshold Pulse Width: 0.5 ms
Lead Channel Pacing Threshold Pulse Width: 1 ms
Lead Channel Sensing Intrinsic Amplitude: 1.9 mV
Lead Channel Sensing Intrinsic Amplitude: 12 mV
Lead Channel Setting Pacing Amplitude: 1.625
Lead Channel Setting Pacing Amplitude: 1.875
Lead Channel Setting Pacing Amplitude: 2 V
Lead Channel Setting Pacing Pulse Width: 0.5 ms
Lead Channel Setting Pacing Pulse Width: 1 ms
Lead Channel Setting Sensing Sensitivity: 0.5 mV
Pulse Gen Serial Number: 111006673

## 2020-12-02 ENCOUNTER — Other Ambulatory Visit (HOSPITAL_COMMUNITY): Payer: Self-pay

## 2020-12-09 ENCOUNTER — Emergency Department (HOSPITAL_BASED_OUTPATIENT_CLINIC_OR_DEPARTMENT_OTHER)
Admission: EM | Admit: 2020-12-09 | Discharge: 2020-12-09 | Disposition: A | Discharge status: Home / Self Care | Payer: Medicare HMO | Attending: Emergency Medicine | Admitting: Emergency Medicine

## 2020-12-09 ENCOUNTER — Encounter (HOSPITAL_BASED_OUTPATIENT_CLINIC_OR_DEPARTMENT_OTHER): Payer: Self-pay

## 2020-12-09 ENCOUNTER — Emergency Department (HOSPITAL_BASED_OUTPATIENT_CLINIC_OR_DEPARTMENT_OTHER): Payer: Medicare HMO

## 2020-12-09 ENCOUNTER — Other Ambulatory Visit: Payer: Self-pay

## 2020-12-09 DIAGNOSIS — I5021 Acute systolic (congestive) heart failure: Secondary | ICD-10-CM | POA: Insufficient documentation

## 2020-12-09 DIAGNOSIS — Z79899 Other long term (current) drug therapy: Secondary | ICD-10-CM | POA: Diagnosis not present

## 2020-12-09 DIAGNOSIS — Z950 Presence of cardiac pacemaker: Secondary | ICD-10-CM | POA: Diagnosis not present

## 2020-12-09 DIAGNOSIS — Z955 Presence of coronary angioplasty implant and graft: Secondary | ICD-10-CM | POA: Insufficient documentation

## 2020-12-09 DIAGNOSIS — I2510 Atherosclerotic heart disease of native coronary artery without angina pectoris: Secondary | ICD-10-CM | POA: Diagnosis not present

## 2020-12-09 DIAGNOSIS — I110 Hypertensive heart disease with heart failure: Secondary | ICD-10-CM | POA: Insufficient documentation

## 2020-12-09 DIAGNOSIS — Z87891 Personal history of nicotine dependence: Secondary | ICD-10-CM | POA: Diagnosis not present

## 2020-12-09 DIAGNOSIS — I10 Essential (primary) hypertension: Secondary | ICD-10-CM

## 2020-12-09 DIAGNOSIS — R079 Chest pain, unspecified: Secondary | ICD-10-CM

## 2020-12-09 DIAGNOSIS — R072 Precordial pain: Secondary | ICD-10-CM | POA: Diagnosis present

## 2020-12-09 LAB — CBC
HCT: 45.8 % (ref 36.0–46.0)
Hemoglobin: 15.2 g/dL — ABNORMAL HIGH (ref 12.0–15.0)
MCH: 32.8 pg (ref 26.0–34.0)
MCHC: 33.2 g/dL (ref 30.0–36.0)
MCV: 98.9 fL (ref 80.0–100.0)
Platelets: 141 10*3/uL — ABNORMAL LOW (ref 150–400)
RBC: 4.63 MIL/uL (ref 3.87–5.11)
RDW: 13 % (ref 11.5–15.5)
WBC: 6.1 10*3/uL (ref 4.0–10.5)
nRBC: 0 % (ref 0.0–0.2)

## 2020-12-09 LAB — BASIC METABOLIC PANEL
Anion gap: 9 (ref 5–15)
BUN: 14 mg/dL (ref 8–23)
CO2: 25 mmol/L (ref 22–32)
Calcium: 9 mg/dL (ref 8.9–10.3)
Chloride: 107 mmol/L (ref 98–111)
Creatinine, Ser: 0.98 mg/dL (ref 0.44–1.00)
GFR, Estimated: >60 mL/min (ref >60)
Glucose, Bld: 122 mg/dL — ABNORMAL HIGH (ref 70–99)
Potassium: 3.6 mmol/L (ref 3.5–5.1)
Sodium: 141 mmol/L (ref 135–145)

## 2020-12-09 LAB — TROPONIN I (HIGH SENSITIVITY)
Troponin I (High Sensitivity): 4 ng/L (ref <18)
Troponin I (High Sensitivity): 4 ng/L (ref <18)

## 2020-12-09 MED ORDER — ASPIRIN 81 MG PO CHEW
324.0000 mg | CHEWABLE_TABLET | Freq: Once | ORAL | Status: AC
  Administered 2020-12-09: 324 mg via ORAL
  Filled 2020-12-09: qty 4

## 2020-12-09 NOTE — ED Provider Notes (Signed)
[facility] EMERGENCY DEPARTMENT Provider Note   CSN: [id number] Arrival date & time: 12/09/20  1214     History Chief Complaint  Patient presents with   Chest Pain    Ann Bernard is a 75 y.o. female.  Treated for congestive heart failure, status post pacemaker placement.  History of coronary artery catheterization which revealed mild, diffuse coronary artery disease.  No stents.   Chest Pain Pain location:  Substernal area Pain quality comment:  "like indigestion" Pain radiates to:  Does not radiate Pain severity:  Mild Onset quality:  Sudden Duration:  5 hours Timing:  Constant Progression:  Improving Chronicity:  New Context: at rest   Relieved by:  Nothing Worsened by:  Nothing Ineffective treatments:  None tried Associated symptoms: nausea and shortness of breath   Associated symptoms: no abdominal pain, no back pain, no cough, no diaphoresis, no fever, no lower extremity edema, no palpitations, no syncope and no vomiting       Past Medical History:  Diagnosis Date   Coronary artery disease    Hypertension     Patient Active Problem List   Diagnosis Date Noted   Acute systolic HF (heart failure) (HCC)    Precordial chest pain    Acute coronary syndrome (HCC) 03/15/2019   Acute CHF (congestive heart failure) (HCC) 03/14/2019   DYSLIPIDEMIA 01/25/2009   TOBACCO ABUSE 01/25/2009   Essential hypertension 01/25/2009   ECTOPIC PREGNANCY 01/25/2009   UTERINE PROLAPSE 01/24/2009   URINARY INCONTINENCE 01/24/2009    Past Surgical History:  Procedure Laterality Date   BIV ICD INSERTION CRT-D N/A 08/27/2019   Procedure: BIV ICD INSERTION CRT-D;  Surgeon: Regan Lemming, MD;  Location: [facility] INVASIVE CV LAB;  Service: Cardiovascular;  Laterality: N/A;   RIGHT/LEFT HEART CATH AND CORONARY ANGIOGRAPHY N/A 03/16/2019   Procedure: RIGHT/LEFT HEART CATH AND CORONARY ANGIOGRAPHY;  Surgeon: Lyn Records, MD;  Location: [facility] INVASIVE CV LAB;  Service:  Cardiovascular;  Laterality: N/A;     OB History   No obstetric history on file.     Family History  Problem Relation Age of Onset   Hypertension Mother    Diabetes Mother    Diabetes Brother    Alcoholism Father    Diabetes type I Grandson     Social History   Tobacco Use   Smoking status: Former    Types: Cigarettes   Smokeless tobacco: Never  Vaping Use   Vaping Use: Never used  Substance Use Topics   Alcohol use: No   Drug use: Never    Home Medications Prior to Admission medications   Medication Sig Start Date End Date Taking? Authorizing Provider  alendronate (FOSAMAX) 70 MG tablet Take 70 mg by mouth once a week. Every Monday 04/26/19   [provider]  atorvastatin (LIPITOR) 40 MG tablet TAKE 1 TABLET BY MOUTH ONCE A DAY AT [facility] 11/01/20 11/01/21  Laurey Morale, MD  carvedilol (COREG) 12.5 MG tablet TAKE 1 TABLET BY MOUTH TWO TIMES DAILY 11/01/20 11/01/21  Laurey Morale, MD  dapagliflozin propanediol (FARXIGA) 10 MG TABS tablet Take 1 tablet (10 mg total) by mouth daily before breakfast. Needs appt for further refills 11/01/20   Laurey Morale, MD  dapagliflozin propanediol (FARXIGA) 10 MG TABS tablet Take 1 tablet (10 mg total) by mouth daily before breakfast. Needs appt for further refills 11/16/20   Laurey Morale, MD  diphenhydrAMINE (SOMINEX) 25 MG tablet Take 1 tablet by mouth daily as needed  for itching.     [provider]  furosemide (LASIX) 20 MG tablet Take 1 tablet (20 mg total) by mouth as needed for fluid. 11/01/20 01/30/21  Laurey Morale, MD  isosorbide-hydrALAZINE (BIDIL) 20-37.5 MG tablet TAKE 1 TABLET BY MOUTH 3 TIMES DAILY 11/01/20 11/01/21  Laurey Morale, MD  Multiple Vitamins-Minerals (PRESERVISION AREDS 2 PO) Take 1 capsule by mouth daily.    [provider]  potassium chloride (KLOR-CON) 10 MEQ tablet TAKE 1 TABLET BY MOUTH WHEN YOU TAKE LASIX 09/09/19   Bensimhon, Bevelyn Buckles, MD  sacubitril-valsartan (ENTRESTO)  97-103 MG TAKE 1 TABLET BY MOUTH 2 TIMES DAILY (NEEDS APPT) 11/01/20 11/01/21  Laurey Morale, MD  spironolactone (ALDACTONE) 25 MG tablet TAKE 1 TABLET BY MOUTH ONCE A DAY 11/01/20 11/01/21  Laurey Morale, MD    Allergies    Aspirin  Review of Systems   Review of Systems  Constitutional:  Negative for chills, diaphoresis and fever.  HENT:  Negative for ear pain and sore throat.   Eyes:  Negative for pain and visual disturbance.  Respiratory:  Positive for shortness of breath. Negative for cough.   Cardiovascular:  Positive for chest pain. Negative for palpitations and syncope.  Gastrointestinal:  Positive for nausea. Negative for abdominal pain and vomiting.  Genitourinary:  Negative for dysuria and hematuria.  Musculoskeletal:  Negative for arthralgias and back pain.  Skin:  Negative for color change and rash.  Neurological:  Negative for seizures and syncope.  All other systems reviewed and are negative.  Physical Exam Updated Vital Signs BP 130/82 (BP Location: Right Arm)   Pulse 71   Temp 98.3 F (36.8 C) (Oral)   Resp 18   Ht 5\' 7"  (1.702 m)   Wt 72.6 kg   SpO2 98%   BMI 25.06 kg/m   Physical Exam Vitals and nursing note reviewed.  Constitutional:      Appearance: She is well-developed.  HENT:     Head: Normocephalic and atraumatic.  Cardiovascular:     Rate and Rhythm: Normal rate and regular rhythm.     Heart sounds: Normal heart sounds.  Pulmonary:     Effort: Pulmonary effort is normal. No tachypnea.     Breath sounds: Normal breath sounds.  Musculoskeletal:     Right lower leg: No edema.     Left lower leg: No edema.  Skin:    General: Skin is warm and dry.  Neurological:     General: No focal deficit present.     Mental Status: She is alert and oriented to person, place, and time.  Psychiatric:        Mood and Affect: Mood normal.        Behavior: Behavior normal.    ED Results / Procedures / Treatments   Labs (all labs ordered are listed, but  only abnormal results are displayed) Labs Reviewed  BASIC METABOLIC PANEL - Abnormal; Notable for the following components:      Result Value   Glucose, Bld 122 (*)    All other components within normal limits  CBC - Abnormal; Notable for the following components:   Hemoglobin 15.2 (*)    Platelets 141 (*)    All other components within normal limits  TROPONIN I (HIGH SENSITIVITY)    EKG EKG Interpretation  Date/Time:  Saturday December 09 2020 12:24:13 EDT Ventricular Rate:  86 PR Interval:  138 QRS Duration: 130 QT Interval:  424 QTC Calculation: 507 R Axis:  182 Text Interpretation: Atrial-sensed ventricular-paced rhythm with occasional AV dual-paced complexes and with frequent and consecutive Premature ventricular complexes Biventricular pacemaker detected Abnormal ECG Similar to prior Confirmed by Pieter Partridge ([id number]) on 12/09/2020 1:04:19 PM  Radiology DG Chest 2 View  Result Date: 12/09/2020 CLINICAL DATA:  chest pain; midsternal EXAM: CHEST - 2 VIEW COMPARISON:  September 07, 2019 FINDINGS: The cardiomediastinal silhouette is unchanged in contour.LEFT chest AICD. No pleural effusion. No pneumothorax. No acute pleuroparenchymal abnormality. Visualized abdomen is unremarkable. Multilevel degenerative changes of the thoracic spine. IMPRESSION: No acute cardiopulmonary abnormality. Electronically Signed   By: Meda Klinefelter MD   On: 12/09/2020 12:42    Procedures Procedures   Medications Ordered in ED Medications  aspirin chewable tablet 324 mg (324 mg Oral Given 12/09/20 1349)    ED Course  I have reviewed the triage vital signs and the nursing notes.  Pertinent labs & imaging results that were available during my care of the patient were reviewed by me and considered in my medical decision making (see chart for details).    MDM Rules/Calculators/A&P                           Ms. Vanegas presents with indigestion.  She has a history of mild, nonobstructive CAD as well as  CHF and a pacemaker.  She was evaluated for ACS.  Work-up was within normal limits.  She is extremely well-appearing and will be discharged home with recommendations to follow with cardiology. Final Clinical Impression(s) / ED Diagnoses Final diagnoses:  Chest pain, unspecified type  Essential hypertension  Coronary artery disease involving native heart, unspecified vessel or lesion type, unspecified whether angina present  Pacemaker    Rx / DC Orders ED Discharge Orders     None        Koleen Distance, MD 12/09/20 1539

## 2020-12-09 NOTE — ED Triage Notes (Signed)
Pt c/o mid sternal chest pain. States feels like she has to burp. States like she has to take deep breaths.

## 2020-12-09 NOTE — ED Notes (Signed)
Pt ambulatory to waiting room. Pt verbalized understanding of discharge instructions.   

## 2020-12-11 ENCOUNTER — Other Ambulatory Visit (HOSPITAL_COMMUNITY): Payer: Self-pay

## 2020-12-15 NOTE — Progress Notes (Signed)
Remote ICD transmission.   

## 2020-12-22 ENCOUNTER — Encounter (HOSPITAL_COMMUNITY): Payer: Medicare HMO | Admitting: Cardiology

## 2021-01-01 ENCOUNTER — Other Ambulatory Visit (HOSPITAL_COMMUNITY): Payer: Self-pay

## 2021-01-13 ENCOUNTER — Other Ambulatory Visit (HOSPITAL_COMMUNITY): Payer: Self-pay

## 2021-01-23 DIAGNOSIS — Z95810 Presence of automatic (implantable) cardiac defibrillator: Secondary | ICD-10-CM | POA: Insufficient documentation

## 2021-01-23 DIAGNOSIS — I2510 Atherosclerotic heart disease of native coronary artery without angina pectoris: Secondary | ICD-10-CM | POA: Insufficient documentation

## 2021-01-27 ENCOUNTER — Other Ambulatory Visit (HOSPITAL_COMMUNITY): Payer: Self-pay

## 2021-01-29 ENCOUNTER — Other Ambulatory Visit (HOSPITAL_COMMUNITY): Payer: Self-pay

## 2021-01-30 ENCOUNTER — Other Ambulatory Visit (HOSPITAL_COMMUNITY): Payer: Self-pay

## 2021-02-03 ENCOUNTER — Other Ambulatory Visit (HOSPITAL_COMMUNITY): Payer: Self-pay

## 2021-02-03 ENCOUNTER — Other Ambulatory Visit (HOSPITAL_COMMUNITY): Payer: Self-pay | Admitting: Cardiology

## 2021-02-05 ENCOUNTER — Other Ambulatory Visit (HOSPITAL_COMMUNITY): Payer: Self-pay

## 2021-02-05 MED ORDER — ENTRESTO 97-103 MG PO TABS
ORAL_TABLET | ORAL | 2 refills | Status: DC
  Filled 2021-02-05: qty 60, 30d supply, fill #0
  Filled 2021-03-02: qty 60, 30d supply, fill #1
  Filled 2021-03-30: qty 60, 30d supply, fill #2

## 2021-02-06 ENCOUNTER — Other Ambulatory Visit (HOSPITAL_COMMUNITY): Payer: Self-pay

## 2021-02-07 ENCOUNTER — Other Ambulatory Visit (HOSPITAL_COMMUNITY): Payer: Self-pay

## 2021-02-09 ENCOUNTER — Other Ambulatory Visit (HOSPITAL_COMMUNITY): Payer: Self-pay

## 2021-02-09 ENCOUNTER — Other Ambulatory Visit (HOSPITAL_COMMUNITY): Payer: Self-pay | Admitting: Cardiology

## 2021-02-10 ENCOUNTER — Other Ambulatory Visit (HOSPITAL_COMMUNITY): Payer: Self-pay

## 2021-02-13 ENCOUNTER — Other Ambulatory Visit (HOSPITAL_COMMUNITY): Payer: Self-pay

## 2021-02-14 ENCOUNTER — Other Ambulatory Visit (HOSPITAL_COMMUNITY): Payer: Self-pay

## 2021-02-23 ENCOUNTER — Ambulatory Visit (INDEPENDENT_AMBULATORY_CARE_PROVIDER_SITE_OTHER): Payer: Medicare HMO

## 2021-02-23 DIAGNOSIS — I428 Other cardiomyopathies: Secondary | ICD-10-CM | POA: Diagnosis not present

## 2021-02-25 LAB — CUP PACEART REMOTE DEVICE CHECK
Battery Remaining Longevity: 65 mo
Battery Remaining Percentage: 72 %
Battery Voltage: 2.98 V
Brady Statistic AP VP Percent: 16 %
Brady Statistic AP VS Percent: 1 %
Brady Statistic AS VP Percent: 81 %
Brady Statistic AS VS Percent: 1 %
Brady Statistic RA Percent Paced: 14 %
Date Time Interrogation Session: 20221007070559
HighPow Impedance: 64 Ohm
Implantable Lead Implant Date: 20210409
Implantable Lead Implant Date: 20210409
Implantable Lead Implant Date: 20210409
Implantable Lead Location: 753858
Implantable Lead Location: 753859
Implantable Lead Location: 753860
Implantable Pulse Generator Implant Date: 20210409
Lead Channel Impedance Value: 440 Ohm
Lead Channel Impedance Value: 480 Ohm
Lead Channel Impedance Value: 550 Ohm
Lead Channel Pacing Threshold Amplitude: 0.5 V
Lead Channel Pacing Threshold Amplitude: 0.75 V
Lead Channel Pacing Threshold Amplitude: 1.5 V
Lead Channel Pacing Threshold Pulse Width: 0.5 ms
Lead Channel Pacing Threshold Pulse Width: 0.5 ms
Lead Channel Pacing Threshold Pulse Width: 1 ms
Lead Channel Sensing Intrinsic Amplitude: 1.6 mV
Lead Channel Sensing Intrinsic Amplitude: 11.7 mV
Lead Channel Setting Pacing Amplitude: 1.75 V
Lead Channel Setting Pacing Amplitude: 2 V
Lead Channel Setting Pacing Amplitude: 2 V
Lead Channel Setting Pacing Pulse Width: 0.5 ms
Lead Channel Setting Pacing Pulse Width: 1 ms
Lead Channel Setting Sensing Sensitivity: 0.5 mV
Pulse Gen Serial Number: 111006673

## 2021-03-02 ENCOUNTER — Other Ambulatory Visit (HOSPITAL_COMMUNITY): Payer: Self-pay | Admitting: Cardiology

## 2021-03-02 ENCOUNTER — Other Ambulatory Visit (HOSPITAL_COMMUNITY): Payer: Self-pay

## 2021-03-02 DIAGNOSIS — I5022 Chronic systolic (congestive) heart failure: Secondary | ICD-10-CM

## 2021-03-03 ENCOUNTER — Other Ambulatory Visit (HOSPITAL_COMMUNITY): Payer: Self-pay

## 2021-03-05 ENCOUNTER — Other Ambulatory Visit (HOSPITAL_COMMUNITY): Payer: Self-pay

## 2021-03-05 ENCOUNTER — Other Ambulatory Visit (HOSPITAL_COMMUNITY): Payer: Self-pay | Admitting: Cardiology

## 2021-03-05 ENCOUNTER — Telehealth: Payer: Self-pay | Admitting: Cardiology

## 2021-03-05 DIAGNOSIS — I5022 Chronic systolic (congestive) heart failure: Secondary | ICD-10-CM

## 2021-03-05 MED ORDER — ISOSORB DINITRATE-HYDRALAZINE 20-37.5 MG PO TABS
1.0000 | ORAL_TABLET | Freq: Three times a day (TID) | ORAL | 11 refills | Status: DC
  Filled 2021-03-05: qty 90, 30d supply, fill #0

## 2021-03-05 NOTE — Telephone Encounter (Signed)
*  STAT* If patient is at the pharmacy, call can be transferred to refill team.   1. Which medications need to be refilled? (please list name of each medication and dose if known)  Isosorbide Hydralazine  2. Which pharmacy/location (including street and city if local pharmacy) is medication to be sent to? [facility]  3. Do they need a 30 day or 90 day supply?  30 day supply  Monique with [facility] states they received Rx for isosorbide-hydrALAZINE (BIDIL) 20-37.5 MG tablet, but patient's insurance will not cover the medications together. They will only cover them separately. Gabriel Rung is requesting to have new prescriptions sent it for each medication. Please assist.

## 2021-03-05 NOTE — Progress Notes (Signed)
Remote ICD transmission.   

## 2021-03-05 NOTE — Telephone Encounter (Signed)
Bidil no longer covered by insurance Ok to switch to separate medication components

## 2021-03-06 ENCOUNTER — Other Ambulatory Visit (HOSPITAL_COMMUNITY): Payer: Self-pay

## 2021-03-06 MED ORDER — ISOSORBIDE MONONITRATE ER 30 MG PO TB24
30.0000 mg | ORAL_TABLET | Freq: Every day | ORAL | 3 refills | Status: DC
  Filled 2021-03-06: qty 90, 90d supply, fill #0

## 2021-03-06 MED ORDER — HYDRALAZINE HCL 50 MG PO TABS
50.0000 mg | ORAL_TABLET | Freq: Two times a day (BID) | ORAL | 3 refills | Status: DC
  Filled 2021-03-06: qty 180, 90d supply, fill #0
  Filled 2021-06-04: qty 180, 90d supply, fill #1
  Filled 2021-09-01: qty 180, 90d supply, fill #2
  Filled 2021-12-01: qty 180, 90d supply, fill #3

## 2021-03-06 NOTE — Telephone Encounter (Signed)
Per Dr. Elberta Fortis, ok for Imdur 30 mg daily and Hydralazine 50 mg bid. Prescriptions for both have been sent to [facility].

## 2021-03-07 ENCOUNTER — Other Ambulatory Visit (HOSPITAL_COMMUNITY): Payer: Self-pay

## 2021-03-08 ENCOUNTER — Other Ambulatory Visit (HOSPITAL_COMMUNITY): Payer: Self-pay

## 2021-03-16 ENCOUNTER — Other Ambulatory Visit (HOSPITAL_COMMUNITY): Payer: Self-pay

## 2021-03-29 ENCOUNTER — Other Ambulatory Visit (HOSPITAL_COMMUNITY): Payer: Self-pay

## 2021-03-30 ENCOUNTER — Other Ambulatory Visit (HOSPITAL_COMMUNITY): Payer: Self-pay

## 2021-04-13 ENCOUNTER — Other Ambulatory Visit (HOSPITAL_COMMUNITY): Payer: Self-pay

## 2021-04-27 ENCOUNTER — Other Ambulatory Visit (HOSPITAL_COMMUNITY): Payer: Self-pay

## 2021-04-27 ENCOUNTER — Other Ambulatory Visit (HOSPITAL_COMMUNITY): Payer: Self-pay | Admitting: Cardiology

## 2021-04-30 ENCOUNTER — Other Ambulatory Visit (HOSPITAL_COMMUNITY): Payer: Self-pay

## 2021-04-30 MED ORDER — ENTRESTO 97-103 MG PO TABS
1.0000 | ORAL_TABLET | Freq: Two times a day (BID) | ORAL | 2 refills | Status: DC
  Filled 2021-04-30: qty 60, 30d supply, fill #0
  Filled 2021-05-28: qty 60, 30d supply, fill #1
  Filled 2021-07-07: qty 60, 30d supply, fill #2

## 2021-05-16 IMAGING — CT CT ANGIO CHEST
2 of 8 series · 18 of 36 positions shown · IV contrast (Omnipaque)
Comparison: None.

CLINICAL DATA: Shortness of breath, chest pain

EXAM:
CT ANGIOGRAPHY CHEST WITH CONTRAST
TECHNIQUE: Multidetector CT imaging of the chest was performed using the
standard protocol during bolus administration of intravenous
contrast. Multiplanar CT image reconstructions and MIPs were
obtained to evaluate the vascular anatomy.
CONTRAST:  81mL OMNIPAQUE IOHEXOL 350 MG/ML SOLN

[Series 5: pe thins · axial · 0.71mm/px · z∈[-245,+26]mm · 17 of 303 slices shown]
[im 16/303  lung]
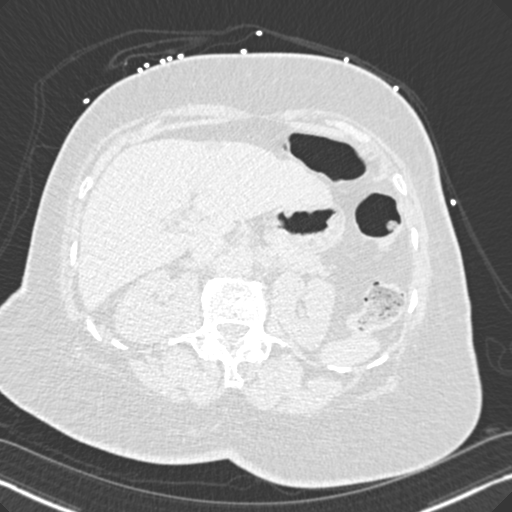
[im 32/303  mediastinal]
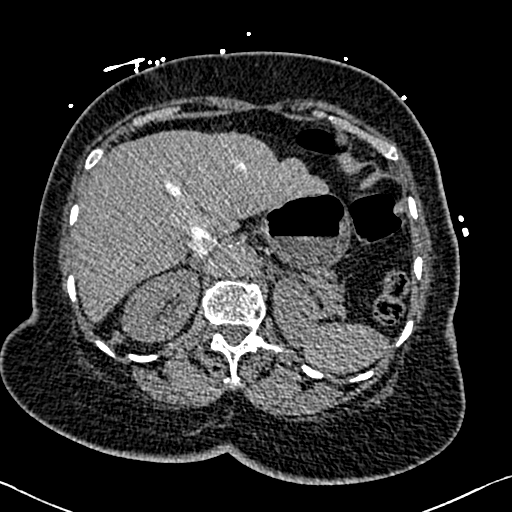
[im 48/303  lung]
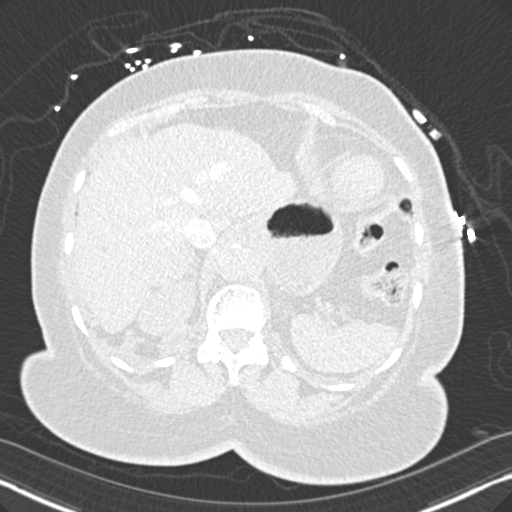
[im 64/303  mediastinal]
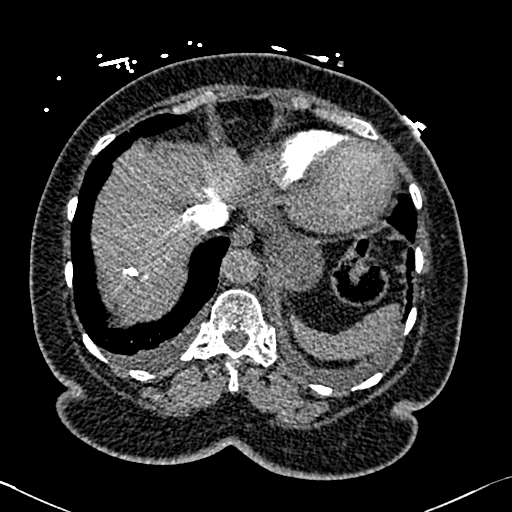
[im 80/303  lung]
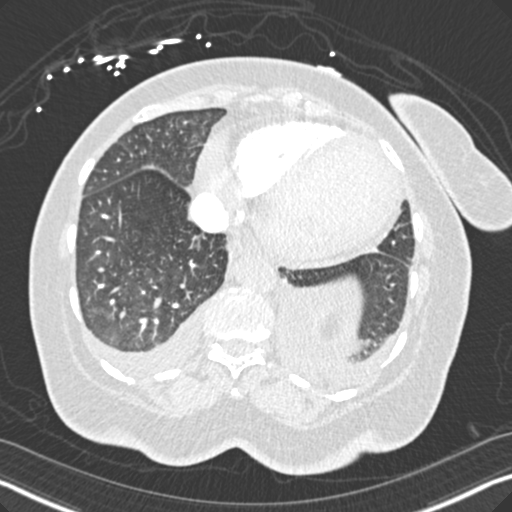
[im 96/303  mediastinal]
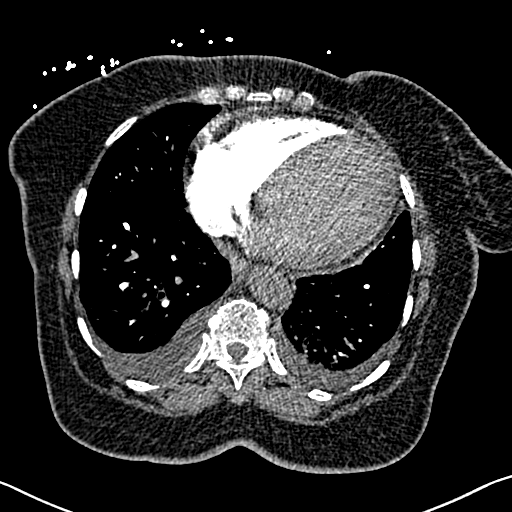
[im 112/303  lung]
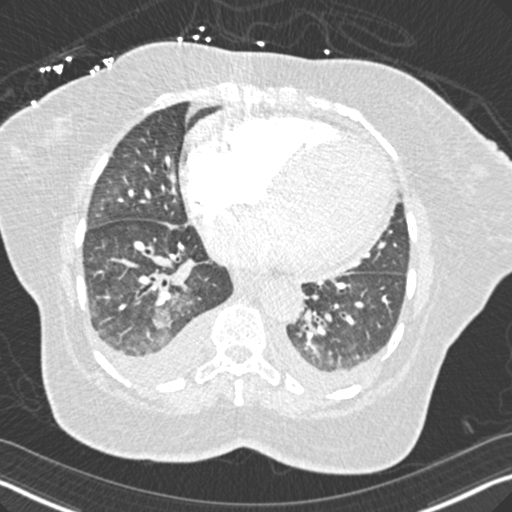
[im 128/303  mediastinal]
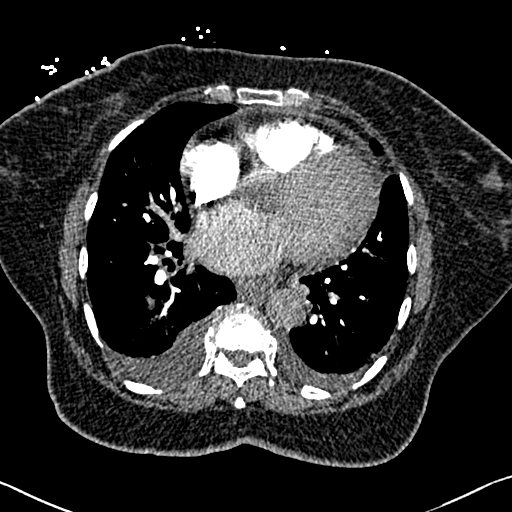
[im 159/303  lung]
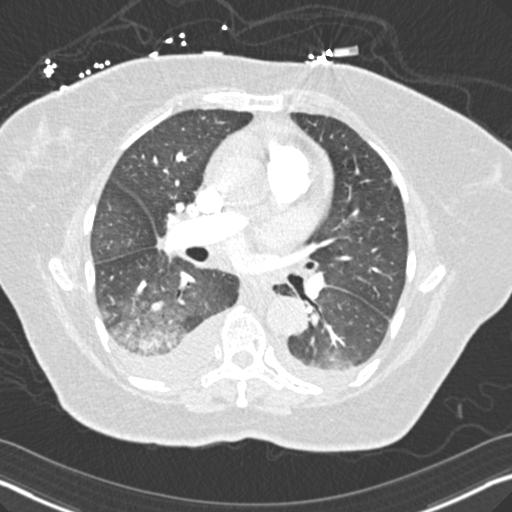
[im 175/303  mediastinal]
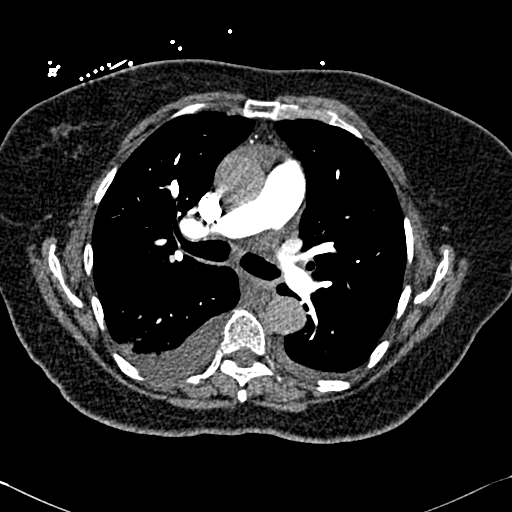
[im 191/303  lung]
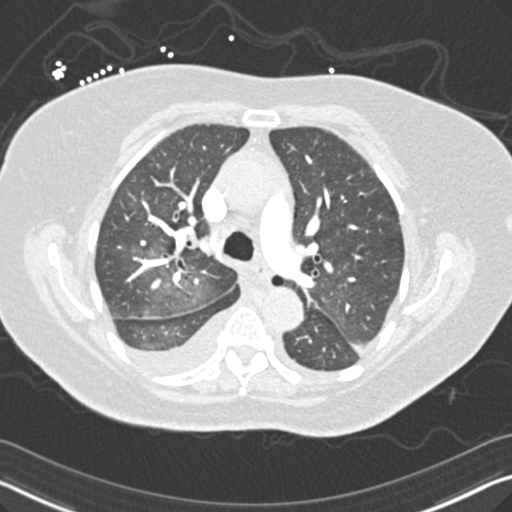
[im 207/303  mediastinal]
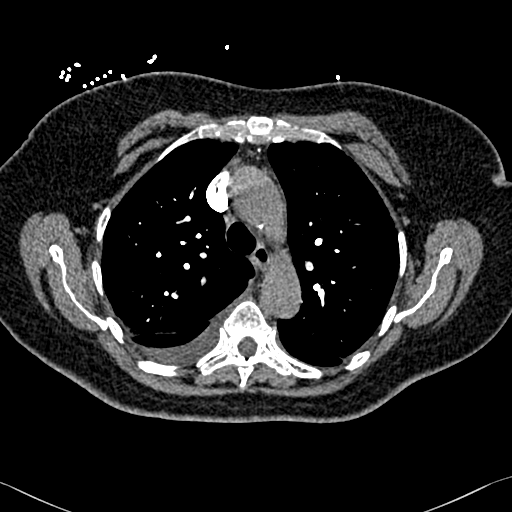
[im 223/303  lung]
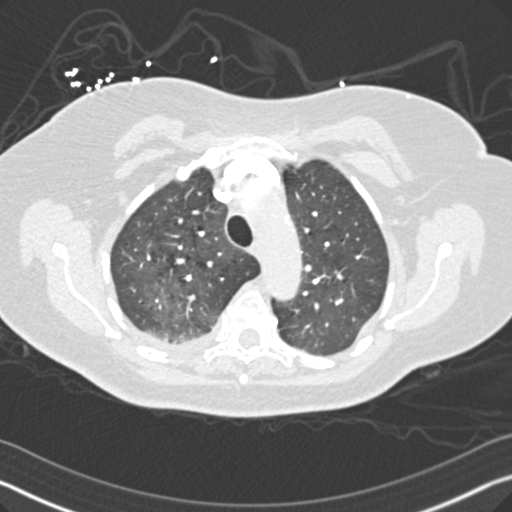
[im 239/303  mediastinal]
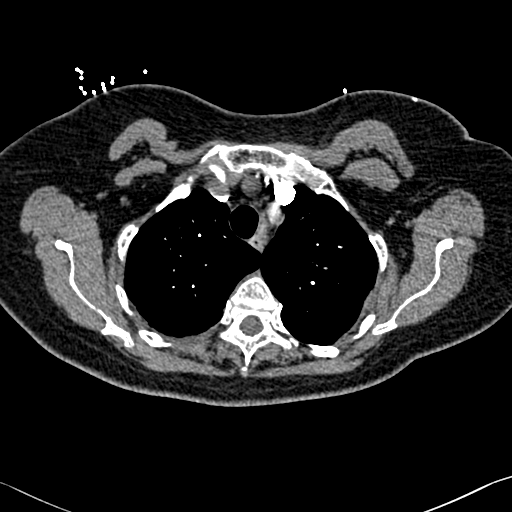
[im 255/303  lung]
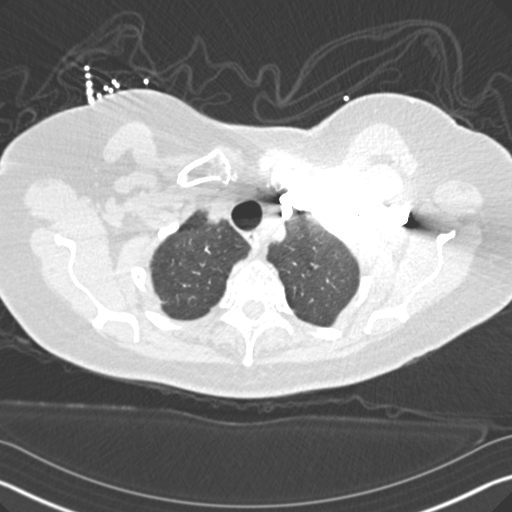
[im 271/303  mediastinal]
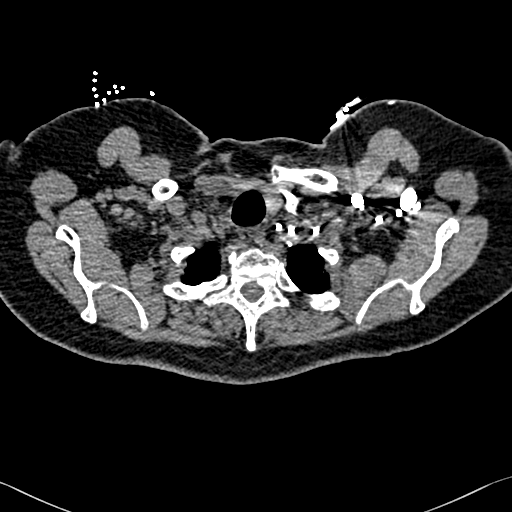
[im 287/303  lung]
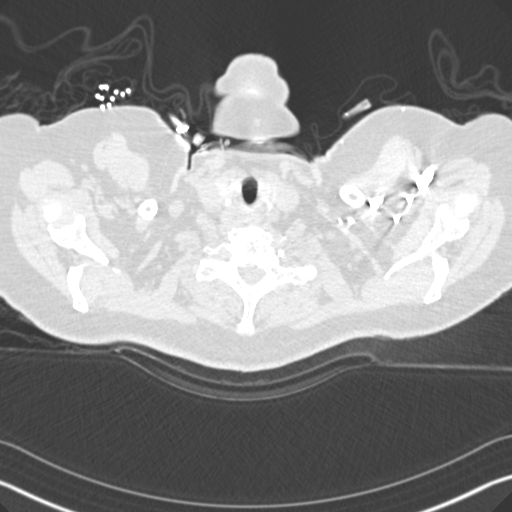

[Series 7: pe coronal mpr · coronal · 0.59mm/px · 1 of 147 slices shown]
[im 74/147  mediastinal]
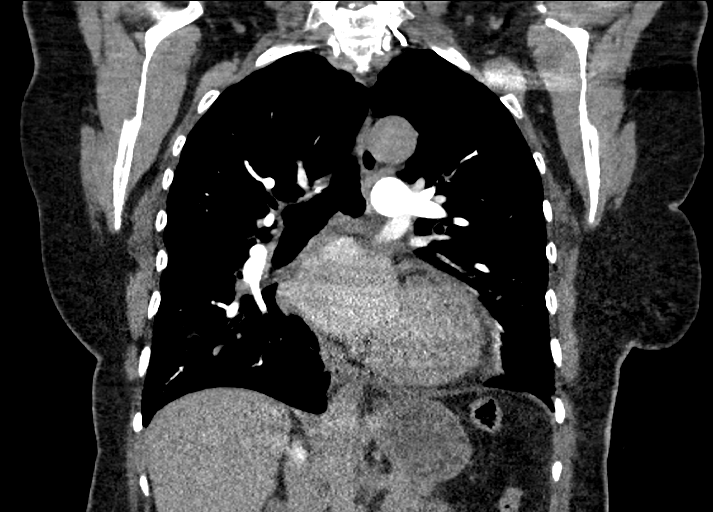

[18 of 36 positions shown; findings below may reference images not displayed]

FINDINGS: Cardiovascular: Satisfactory opacification of the pulmonary arteries
to the segmental level. No evidence of pulmonary embolism.
Cardiomegaly. Scattered left coronary artery calcifications. Trace
pericardial effusion. Scattered aortic atherosclerosis.

Mediastinum/Nodes: No enlarged mediastinal, hilar, or axillary lymph
nodes. Thyroid gland, trachea, and esophagus demonstrate no
significant findings.

Lungs/Pleura: Mild centrilobular emphysema. Heterogeneous and
ground-glass opacities, most conspicuous in the dependent right
lower lobe. Diffuse bilateral bronchial wall thickening. Small,
right greater than left pleural effusions.

Upper Abdomen: No acute abnormality.

Musculoskeletal: No chest wall abnormality. No acute or significant
osseous findings.

Review of the MIP images confirms the above findings.
IMPRESSION: 1.  Negative examination for pulmonary embolism.

2. Heterogeneous and ground-glass opacities, most conspicuous in the
dependent right lower lobe. Findings are most consistent with
infection, however edema could have this asymmetric appearance.

3.  Small bilateral pleural effusions.

4.  Diffuse bilateral bronchial wall thickening, nonspecific.

5.  Cardiomegaly and coronary artery disease.

6.  Trace, nonspecific pericardial effusion.

7.  Aortic Atherosclerosis ([id number]).

## 2021-05-18 ENCOUNTER — Other Ambulatory Visit (HOSPITAL_COMMUNITY): Payer: Self-pay

## 2021-05-18 ENCOUNTER — Other Ambulatory Visit (HOSPITAL_COMMUNITY): Payer: Self-pay | Admitting: Cardiology

## 2021-05-22 ENCOUNTER — Other Ambulatory Visit (HOSPITAL_COMMUNITY): Payer: Self-pay

## 2021-05-22 MED ORDER — DAPAGLIFLOZIN PROPANEDIOL 10 MG PO TABS
10.0000 mg | ORAL_TABLET | Freq: Every day | ORAL | 0 refills | Status: DC
  Filled 2021-05-22: qty 30, 30d supply, fill #0

## 2021-05-25 ENCOUNTER — Ambulatory Visit (INDEPENDENT_AMBULATORY_CARE_PROVIDER_SITE_OTHER): Payer: Medicare HMO

## 2021-05-25 DIAGNOSIS — I428 Other cardiomyopathies: Secondary | ICD-10-CM

## 2021-05-25 LAB — CUP PACEART REMOTE DEVICE CHECK
Battery Remaining Longevity: 62 mo
Battery Remaining Percentage: 69 %
Battery Voltage: 2.98 V
Brady Statistic AP VP Percent: 19 %
Brady Statistic AP VS Percent: 1 %
Brady Statistic AS VP Percent: 79 %
Brady Statistic AS VS Percent: 1 %
Brady Statistic RA Percent Paced: 16 %
Date Time Interrogation Session: 20230106015512
HighPow Impedance: 64 Ohm
Implantable Lead Implant Date: 20210409
Implantable Lead Implant Date: 20210409
Implantable Lead Implant Date: 20210409
Implantable Lead Location: 753858
Implantable Lead Location: 753859
Implantable Lead Location: 753860
Implantable Pulse Generator Implant Date: 20210409
Lead Channel Impedance Value: 460 Ohm
Lead Channel Impedance Value: 490 Ohm
Lead Channel Impedance Value: 600 Ohm
Lead Channel Pacing Threshold Amplitude: 0.5 V
Lead Channel Pacing Threshold Amplitude: 0.625 V
Lead Channel Pacing Threshold Amplitude: 1.5 V
Lead Channel Pacing Threshold Pulse Width: 0.5 ms
Lead Channel Pacing Threshold Pulse Width: 0.5 ms
Lead Channel Pacing Threshold Pulse Width: 1 ms
Lead Channel Sensing Intrinsic Amplitude: 1.3 mV
Lead Channel Sensing Intrinsic Amplitude: 11.9 mV
Lead Channel Setting Pacing Amplitude: 1.625
Lead Channel Setting Pacing Amplitude: 2 V
Lead Channel Setting Pacing Amplitude: 2 V
Lead Channel Setting Pacing Pulse Width: 0.5 ms
Lead Channel Setting Pacing Pulse Width: 1 ms
Lead Channel Setting Sensing Sensitivity: 0.5 mV
Pulse Gen Serial Number: 111006673

## 2021-05-28 ENCOUNTER — Other Ambulatory Visit (HOSPITAL_COMMUNITY): Payer: Self-pay

## 2021-06-04 ENCOUNTER — Other Ambulatory Visit (HOSPITAL_COMMUNITY): Payer: Self-pay

## 2021-06-04 NOTE — Progress Notes (Signed)
Remote ICD transmission.   

## 2021-06-06 ENCOUNTER — Other Ambulatory Visit (HOSPITAL_COMMUNITY): Payer: Self-pay

## 2021-06-18 ENCOUNTER — Other Ambulatory Visit (HOSPITAL_COMMUNITY): Payer: Self-pay | Admitting: Cardiology

## 2021-06-18 ENCOUNTER — Other Ambulatory Visit (HOSPITAL_COMMUNITY): Payer: Self-pay

## 2021-06-19 ENCOUNTER — Other Ambulatory Visit: Payer: Self-pay

## 2021-06-19 ENCOUNTER — Other Ambulatory Visit (HOSPITAL_COMMUNITY): Payer: Self-pay | Admitting: Cardiology

## 2021-06-19 ENCOUNTER — Other Ambulatory Visit (HOSPITAL_COMMUNITY): Payer: Self-pay | Admitting: *Deleted

## 2021-06-19 ENCOUNTER — Other Ambulatory Visit (HOSPITAL_COMMUNITY): Payer: Self-pay

## 2021-06-19 MED ORDER — DAPAGLIFLOZIN PROPANEDIOL 10 MG PO TABS
10.0000 mg | ORAL_TABLET | Freq: Every day | ORAL | 0 refills | Status: DC
  Filled 2021-06-19: qty 30, 30d supply, fill #0

## 2021-06-19 MED ORDER — DAPAGLIFLOZIN PROPANEDIOL 10 MG PO TABS
10.0000 mg | ORAL_TABLET | Freq: Every day | ORAL | 0 refills | Status: DC
  Filled 2021-06-19 – 2021-06-20 (×2): qty 30, 30d supply, fill #0

## 2021-06-19 NOTE — Telephone Encounter (Signed)
This is a CHF pt, has an upcoming appt with Dr. Shirlee Latch on 06/26/21. Please address

## 2021-06-20 ENCOUNTER — Other Ambulatory Visit (HOSPITAL_COMMUNITY): Payer: Self-pay

## 2021-06-26 ENCOUNTER — Encounter (HOSPITAL_COMMUNITY): Payer: Medicare HMO | Admitting: Cardiology

## 2021-07-02 ENCOUNTER — Other Ambulatory Visit (HOSPITAL_COMMUNITY): Payer: Self-pay

## 2021-07-02 ENCOUNTER — Telehealth (HOSPITAL_COMMUNITY): Payer: Self-pay | Admitting: Cardiology

## 2021-07-02 ENCOUNTER — Other Ambulatory Visit (HOSPITAL_COMMUNITY): Payer: Self-pay | Admitting: *Deleted

## 2021-07-02 MED ORDER — SPIRONOLACTONE 25 MG PO TABS
ORAL_TABLET | Freq: Every day | ORAL | 2 refills | Status: DC
  Filled 2021-07-02: qty 90, 90d supply, fill #0
  Filled 2021-09-29: qty 90, 90d supply, fill #1
  Filled 2021-12-01: qty 90, 90d supply, fill #2

## 2021-07-02 MED ORDER — SPIRONOLACTONE 25 MG PO TABS
ORAL_TABLET | Freq: Every day | ORAL | 0 refills | Status: DC
  Filled 2021-07-02: qty 90, 90d supply, fill #0

## 2021-07-02 NOTE — Telephone Encounter (Signed)
Refill sent.

## 2021-07-02 NOTE — Telephone Encounter (Signed)
Pt request spironolactone refill, pt appt scheduled 03/29, she only has 2 pills left, please send script to [facility]. Thanks

## 2021-07-05 ENCOUNTER — Other Ambulatory Visit (HOSPITAL_COMMUNITY): Payer: Self-pay

## 2021-07-07 ENCOUNTER — Other Ambulatory Visit (HOSPITAL_COMMUNITY): Payer: Self-pay

## 2021-07-16 ENCOUNTER — Other Ambulatory Visit (HOSPITAL_COMMUNITY): Payer: Self-pay

## 2021-07-16 ENCOUNTER — Other Ambulatory Visit (HOSPITAL_COMMUNITY): Payer: Self-pay | Admitting: Cardiology

## 2021-07-17 ENCOUNTER — Other Ambulatory Visit (HOSPITAL_COMMUNITY): Payer: Self-pay | Admitting: *Deleted

## 2021-07-17 ENCOUNTER — Telehealth (HOSPITAL_COMMUNITY): Payer: Self-pay | Admitting: Cardiology

## 2021-07-17 ENCOUNTER — Other Ambulatory Visit (HOSPITAL_COMMUNITY): Payer: Self-pay

## 2021-07-17 MED ORDER — DAPAGLIFLOZIN PROPANEDIOL 10 MG PO TABS
10.0000 mg | ORAL_TABLET | Freq: Every day | ORAL | 0 refills | Status: DC
  Filled 2021-07-17: qty 30, 30d supply, fill #0

## 2021-07-17 NOTE — Telephone Encounter (Signed)
Pt request Farxiga refill, pt appt scheduled 03/29, please send script to [facility]

## 2021-07-18 ENCOUNTER — Other Ambulatory Visit (HOSPITAL_COMMUNITY): Payer: Self-pay

## 2021-08-06 ENCOUNTER — Other Ambulatory Visit (HOSPITAL_COMMUNITY): Payer: Self-pay | Admitting: Cardiology

## 2021-08-06 ENCOUNTER — Other Ambulatory Visit (HOSPITAL_COMMUNITY): Payer: Self-pay

## 2021-08-06 MED ORDER — ENTRESTO 97-103 MG PO TABS
1.0000 | ORAL_TABLET | Freq: Two times a day (BID) | ORAL | 2 refills | Status: DC
  Filled 2021-08-06: qty 60, 30d supply, fill #0
  Filled 2021-09-01: qty 60, 30d supply, fill #1
  Filled 2021-10-12: qty 60, 30d supply, fill #2

## 2021-08-13 ENCOUNTER — Other Ambulatory Visit (HOSPITAL_COMMUNITY): Payer: Self-pay

## 2021-08-13 ENCOUNTER — Other Ambulatory Visit (HOSPITAL_COMMUNITY): Payer: Self-pay | Admitting: Cardiology

## 2021-08-14 ENCOUNTER — Other Ambulatory Visit (HOSPITAL_COMMUNITY): Payer: Self-pay

## 2021-08-14 MED ORDER — DAPAGLIFLOZIN PROPANEDIOL 10 MG PO TABS
10.0000 mg | ORAL_TABLET | Freq: Every day | ORAL | 0 refills | Status: DC
  Filled 2021-08-14: qty 30, 30d supply, fill #0

## 2021-08-15 ENCOUNTER — Other Ambulatory Visit: Payer: Self-pay

## 2021-08-15 ENCOUNTER — Other Ambulatory Visit (HOSPITAL_COMMUNITY): Payer: Self-pay

## 2021-08-15 ENCOUNTER — Encounter (HOSPITAL_COMMUNITY): Payer: Self-pay | Admitting: Cardiology

## 2021-08-15 ENCOUNTER — Ambulatory Visit (HOSPITAL_COMMUNITY)
Admission: RE | Admit: 2021-08-15 | Discharge: 2021-08-15 | Disposition: A | Discharge status: Home / Self Care | Payer: Medicare HMO | Source: Ambulatory Visit | Attending: Cardiology | Admitting: Cardiology

## 2021-08-15 VITALS — BP 130/80 | HR 61 | Wt 167.8 lb

## 2021-08-15 DIAGNOSIS — I110 Hypertensive heart disease with heart failure: Secondary | ICD-10-CM | POA: Diagnosis not present

## 2021-08-15 DIAGNOSIS — I5022 Chronic systolic (congestive) heart failure: Secondary | ICD-10-CM

## 2021-08-15 DIAGNOSIS — Z7984 Long term (current) use of oral hypoglycemic drugs: Secondary | ICD-10-CM | POA: Insufficient documentation

## 2021-08-15 DIAGNOSIS — Z79899 Other long term (current) drug therapy: Secondary | ICD-10-CM | POA: Insufficient documentation

## 2021-08-15 DIAGNOSIS — I5021 Acute systolic (congestive) heart failure: Secondary | ICD-10-CM | POA: Diagnosis not present

## 2021-08-15 DIAGNOSIS — I428 Other cardiomyopathies: Secondary | ICD-10-CM | POA: Insufficient documentation

## 2021-08-15 DIAGNOSIS — Z8249 Family history of ischemic heart disease and other diseases of the circulatory system: Secondary | ICD-10-CM | POA: Diagnosis not present

## 2021-08-15 DIAGNOSIS — E785 Hyperlipidemia, unspecified: Secondary | ICD-10-CM | POA: Insufficient documentation

## 2021-08-15 DIAGNOSIS — Z87891 Personal history of nicotine dependence: Secondary | ICD-10-CM | POA: Diagnosis not present

## 2021-08-15 DIAGNOSIS — I447 Left bundle-branch block, unspecified: Secondary | ICD-10-CM | POA: Insufficient documentation

## 2021-08-15 DIAGNOSIS — I2510 Atherosclerotic heart disease of native coronary artery without angina pectoris: Secondary | ICD-10-CM | POA: Insufficient documentation

## 2021-08-15 LAB — BASIC METABOLIC PANEL
Anion gap: 9 (ref 5–15)
BUN: 13 mg/dL (ref 8–23)
CO2: 22 mmol/L (ref 22–32)
Calcium: 9.2 mg/dL (ref 8.9–10.3)
Chloride: 110 mmol/L (ref 98–111)
Creatinine, Ser: 0.88 mg/dL (ref 0.44–1.00)
GFR, Estimated: >60 mL/min (ref >60)
Glucose, Bld: 115 mg/dL — ABNORMAL HIGH (ref 70–99)
Potassium: 3.2 mmol/L — ABNORMAL LOW (ref 3.5–5.1)
Sodium: 141 mmol/L (ref 135–145)

## 2021-08-15 LAB — LIPID PANEL
Cholesterol: 135 mg/dL (ref 0–200)
HDL: 65 mg/dL (ref >40)
LDL Cholesterol: 61 mg/dL (ref 0–99)
Total CHOL/HDL Ratio: 2.1 RATIO
Triglycerides: 44 mg/dL (ref <150)
VLDL: 9 mg/dL (ref 0–40)

## 2021-08-15 NOTE — Patient Instructions (Addendum)
There has been no changes to your medications. ? ?Labs done today, your results will be available in MyChart, we will contact you for abnormal readings. ? ? ?Repeat blood work in 3 months  ? ?Your physician has requested that you have an echocardiogram. Echocardiography is a painless test that uses sound waves to create images of your heart. It provides your doctor with information about the size and shape of your heart and how well your heart?s chambers and valves are working. This procedure takes approximately one hour. There are no restrictions for this procedure. ? ?Your physician recommends that you schedule a follow-up appointment in: 6 months (September 2023) ** please call the office in July to arrange your follow up appointment ** ? ?If you have any questions or concerns before your next appointment please send [place] a message through [place] or call our office at [phone].   ? ?TO LEAVE A MESSAGE FOR THE NURSE SELECT OPTION 2, PLEASE LEAVE A MESSAGE INCLUDING: ?YOUR NAME ?DATE OF BIRTH ?CALL BACK NUMBER ?REASON FOR CALL**this is important as we prioritize the call backs ? ?YOU WILL RECEIVE A CALL BACK THE SAME DAY AS LONG AS YOU CALL BEFORE 4:00 PM ? ?At the [facility], you and your health needs are our priority. As part of our continuing mission to provide you with exceptional heart care, we have created designated Provider Care Teams. These Care Teams include your primary Cardiologist (physician) and Advanced Practice Providers (APPs- Physician Assistants and Nurse Practitioners) who all work together to provide you with the care you need, when you need it.  ? ?You may see any of the following providers on your designated Care Team at your next follow up: ?Dr Glori Bickers ?Dr Loralie Champagne ?Darrick Grinder, NP ?Lyda Jester, PA ?Jessica Milford,NP ?Marlyce Huge, PA ?Audry Riles, PharmD ? ? ?Please be sure to bring in all your medications bottles to every appointment.  ? ? ?

## 2021-08-16 NOTE — Progress Notes (Signed)
?PCP: Drosinis, Leonia Reader, PA-C ?HF Cardiology: Dr. Shirlee Latch ? ?[name] is a 76 y.o. with history of HTN, hyperlipidemia, tobacco abuse, and chronic systolic heart failure. Of note, her brother has heart failure and has an ICD.  ?  ?Admitted to [facility] in 10/20 with acute systolic heart failure. CTA negative for PE. ECHO was completed and showed severely reduced EF. Diagnostic cath was performed as noted below, mild coronary disease not likely to have caused cardiomyopathy.  Diuresed with IV lasix and transitioned to lasix 20 mg daily. Started on digoxin, carvedilol, and Entresto. Discharge weight 174 pounds.     ? ?Echo in 2/21 showed EF 20-25% with septal-lateral dyssynchrony.  Patient had St Jude CRT-D device placed.  ? ?Echo in 7/21 showed EF up to 50% with mild LVH, normal RV.  ? ?She returns for followup of CHF.  She is not smoking.  She denies exertional dyspnea or chest pain.  No lightheadedness.  No orthopnea/PND.  Occasional fatigue.  ? ?Labs (2/21): digoxin 0.5, K 3.8, creatinine 0.78 ?Labs (4/21): LDL 64, K 3.6, creatinine 3.90 ?Labs (7/22): K 3.6, creatinine 0.98 ? ?ECG (personally reviewed): a-BiV pacing ? ?PMH: ?1. Chronic systolic CHF: Nonischemic cardiomyopathy.  ?- Echo (10/20): EF < 20%, moderately dilated LV, severe global HK.  ?- RHC/LHC (10/20): D1 and D2 with 50% ostial stenosis.  PCWP 23, CI 2.34.  ?- CMRI (10/20): Moderately dilated LV with EF 13%, diffuse hypokinesis, septal-lateral dyssynchrony, normal RV size with EF 30%, nonspecific RV insertion site LGE.   ?- Echo (2/21): EF 20-25%, septal-lateral dyssynchrony, mild AI, mild-moderately decreased RV systolic function.  ?- St Jude CRT-D device placed.  ?- Echo (7/21): EF 50%, mild LVH, normal RV ?2. Active smoker.  ?3. Hyperlipidemia ?4. HTN ?5. ABIs (2/21): Normal.  ? ?ROS: All systems negative except as listed in HPI, PMH and Problem List. ? ?SH:  ?Social History  ? ?Socioeconomic History  ? Marital status: Widowed  ?  Spouse name:  Not on file  ? Number of children: Not on file  ? Years of education: Not on file  ? Highest education level: Not on file  ?Occupational History  ? Not on file  ?Tobacco Use  ? Smoking status: Former  ?  Types: Cigarettes  ? Smokeless tobacco: Never  ?Vaping Use  ? Vaping Use: Never used  ?Substance and Sexual Activity  ? Alcohol use: No  ? Drug use: Never  ? Sexual activity: Not Currently  ?Other Topics Concern  ? Not on file  ?Social History Narrative  ? Not on file  ? ?Social Determinants of Health  ? ?Financial Resource Strain: Not on file  ?Food Insecurity: Not on file  ?Transportation Needs: Not on file  ?Physical Activity: Not on file  ?Stress: Not on file  ?Social Connections: Not on file  ?Intimate Partner Violence: Not on file  ? ? ?FH:  ?Family History  ?Problem Relation Age of Onset  ? Hypertension Mother   ? Diabetes Mother   ? Diabetes Brother   ? Alcoholism Father   ? Diabetes type I Grandson   ? ? ?Current Outpatient Medications  ?Medication Sig Dispense Refill  ? alendronate (FOSAMAX) 70 MG tablet Take 70 mg by mouth once a week. Every Monday    ? atorvastatin (LIPITOR) 40 MG tablet TAKE 1 TABLET BY MOUTH ONCE A DAY AT 6PM 90 tablet 3  ? carvedilol (COREG) 12.5 MG tablet TAKE 1 TABLET BY MOUTH TWO TIMES DAILY 60 tablet  11  ? dapagliflozin propanediol (FARXIGA) 10 MG TABS tablet Take 1 tablet (10 mg total) by mouth daily before breakfast. Must keep appt for further refills 30 tablet 0  ? diphenhydrAMINE (SOMINEX) 25 MG tablet Take 1 tablet by mouth daily as needed for itching.     ? furosemide (LASIX) 20 MG tablet Take 1 tablet (20 mg total) by mouth as needed for fluid. 90 tablet 3  ? hydrALAZINE (APRESOLINE) 50 MG tablet Take 1 tablet by mouth in the morning and at bedtime. 180 tablet 3  ? isosorbide mononitrate (IMDUR) 30 MG 24 hr tablet Take 1 tablet by mouth daily. 90 tablet 3  ? Multiple Vitamins-Minerals (PRESERVISION AREDS 2 PO) Take 1 capsule by mouth daily.    ? potassium chloride  (KLOR-CON) 10 MEQ tablet TAKE 1 TABLET BY MOUTH WHEN YOU TAKE LASIX 90 tablet 1  ? sacubitril-valsartan (ENTRESTO) 97-103 MG Take 1 tablet by mouth 2 (two) times daily. 60 tablet 2  ? spironolactone (ALDACTONE) 25 MG tablet TAKE 1 TABLET BY MOUTH ONCE A DAY 90 tablet 2  ? ?No current facility-administered medications for this encounter.  ? ? ?Vitals:  ? 08/15/21 1028  ?BP: 130/80  ?Pulse: 61  ?SpO2: 100%  ?Weight: 76.1 kg (167 lb 12.8 oz)  ? ?Wt Readings from Last 3 Encounters:  ?08/15/21 76.1 kg (167 lb 12.8 oz)  ?12/09/20 72.6 kg (160 lb)  ?11/13/20 76.7 kg (169 lb)  ? ?PHYSICAL EXAM: ?General: NAD ?Neck: No JVD, no thyromegaly or thyroid nodule.  ?Lungs: Clear to auscultation bilaterally with normal respiratory effort. ?CV: Nondisplaced PMI.  Heart regular S1/S2, no S3/S4, no murmur.  No peripheral edema.  No carotid bruit.  Normal pedal pulses.  ?Abdomen: Soft, nontender, no hepatosplenomegaly, no distention.  ?Skin: Intact without lesions or rashes.  ?Neurologic: Alert and oriented x 3.  ?Psych: Normal affect. ?Extremities: No clubbing or cyanosis.  ?HEENT: Normal.  ? ? ?ASSESSMENT & PLAN: ?1. Chronic systolic CHF: Nonischemic cardiomyopathy. Echo 10/20 with EF < 20%.  LHC/RHC 10/20 showed nonobstructive coronary disease, CI 2.3.  Cardiac MRI (10/20) showed EF 13%, moderate LV dilation, septal-lateral dyssynchrony c/w LBBB and diffuse severe hypokinesis, normal RV size with moderate systolic dysfunction EF 30%, mild-moderate MR, moderate AI, LGE at inferior RV insertion site (nonspecific). Echo in 2/21 showed that EF remained 20-25%. She has a wide LBBB => St Jude CRT-D device placed. 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  Echo in 7/21 showed improvement in EF up to 50%.  NYHA class I-II symptoms, not volume overloaded.  ?- Continue Coreg 12.5 mg bid.  ?- Continue Entresto 97/103 mg twice a day. BMET today.    ?- Continue spironolactone to 25 mg daily.  ?- Continue Farxiga 10 mg daily.   ?- Continue Bidil 1 tab tid.  ?- I will arrange for repeat echo to make sure that the EF remains normal.  ?2. CAD: Nonobstructive CAD.  ?- Continue atorvastatin, check lipids.     ? ?BMET 3 months, followup in 6 months with APP.  ? ?Marca Ancona ?08/16/2021 ? ? ? ? ?

## 2021-08-24 ENCOUNTER — Ambulatory Visit (INDEPENDENT_AMBULATORY_CARE_PROVIDER_SITE_OTHER): Payer: Medicare HMO

## 2021-08-24 ENCOUNTER — Encounter (HOSPITAL_COMMUNITY): Payer: Self-pay

## 2021-08-24 DIAGNOSIS — I5022 Chronic systolic (congestive) heart failure: Secondary | ICD-10-CM

## 2021-08-24 DIAGNOSIS — I428 Other cardiomyopathies: Secondary | ICD-10-CM | POA: Diagnosis not present

## 2021-08-25 LAB — CUP PACEART REMOTE DEVICE CHECK
Battery Remaining Longevity: 59 mo
Battery Remaining Percentage: 66 %
Battery Voltage: 2.98 V
Brady Statistic AP VP Percent: 20 %
Brady Statistic AP VS Percent: 1 %
Brady Statistic AS VP Percent: 78 %
Brady Statistic AS VS Percent: 1 %
Brady Statistic RA Percent Paced: 18 %
Date Time Interrogation Session: 20230407030505
HighPow Impedance: 62 Ohm
Implantable Lead Implant Date: 20210409
Implantable Lead Implant Date: 20210409
Implantable Lead Implant Date: 20210409
Implantable Lead Location: 753858
Implantable Lead Location: 753859
Implantable Lead Location: 753860
Implantable Pulse Generator Implant Date: 20210409
Lead Channel Impedance Value: 440 Ohm
Lead Channel Impedance Value: 460 Ohm
Lead Channel Impedance Value: 560 Ohm
Lead Channel Pacing Threshold Amplitude: 0.5 V
Lead Channel Pacing Threshold Amplitude: 0.75 V
Lead Channel Pacing Threshold Amplitude: 1.75 V
Lead Channel Pacing Threshold Pulse Width: 0.5 ms
Lead Channel Pacing Threshold Pulse Width: 0.5 ms
Lead Channel Pacing Threshold Pulse Width: 1 ms
Lead Channel Sensing Intrinsic Amplitude: 1.9 mV
Lead Channel Sensing Intrinsic Amplitude: 11.9 mV
Lead Channel Setting Pacing Amplitude: 1.75 V
Lead Channel Setting Pacing Amplitude: 2 V
Lead Channel Setting Pacing Amplitude: 2.25 V
Lead Channel Setting Pacing Pulse Width: 0.5 ms
Lead Channel Setting Pacing Pulse Width: 1 ms
Lead Channel Setting Sensing Sensitivity: 0.5 mV
Pulse Gen Serial Number: 111006673

## 2021-09-01 ENCOUNTER — Other Ambulatory Visit (HOSPITAL_COMMUNITY): Payer: Self-pay

## 2021-09-11 NOTE — Progress Notes (Signed)
Remote ICD transmission.   

## 2021-09-13 ENCOUNTER — Other Ambulatory Visit (HOSPITAL_COMMUNITY): Payer: Self-pay

## 2021-09-13 ENCOUNTER — Other Ambulatory Visit (HOSPITAL_COMMUNITY): Payer: Self-pay | Admitting: Cardiology

## 2021-09-13 MED ORDER — DAPAGLIFLOZIN PROPANEDIOL 10 MG PO TABS
10.0000 mg | ORAL_TABLET | Freq: Every day | ORAL | 3 refills | Status: DC
  Filled 2021-09-13: qty 90, 90d supply, fill #0
  Filled 2021-12-14: qty 90, 90d supply, fill #1
  Filled 2022-03-11: qty 90, 90d supply, fill #2
  Filled 2022-05-14 – 2022-05-27 (×3): qty 90, 90d supply, fill #3

## 2021-10-01 ENCOUNTER — Other Ambulatory Visit (HOSPITAL_COMMUNITY): Payer: Self-pay

## 2021-10-08 ENCOUNTER — Other Ambulatory Visit (HOSPITAL_COMMUNITY): Payer: Self-pay

## 2021-10-12 ENCOUNTER — Other Ambulatory Visit (HOSPITAL_COMMUNITY): Payer: Self-pay

## 2021-11-09 ENCOUNTER — Other Ambulatory Visit (HOSPITAL_COMMUNITY): Payer: Self-pay | Admitting: Cardiology

## 2021-11-09 ENCOUNTER — Other Ambulatory Visit (HOSPITAL_COMMUNITY): Payer: Self-pay

## 2021-11-12 ENCOUNTER — Other Ambulatory Visit (HOSPITAL_COMMUNITY): Payer: Self-pay

## 2021-11-12 MED ORDER — ENTRESTO 97-103 MG PO TABS
1.0000 | ORAL_TABLET | Freq: Two times a day (BID) | ORAL | 2 refills | Status: DC
  Filled 2021-11-12: qty 60, 30d supply, fill #0
  Filled 2021-12-14: qty 60, 30d supply, fill #1
  Filled 2022-01-19: qty 60, 30d supply, fill #2

## 2021-11-12 MED ORDER — CARVEDILOL 12.5 MG PO TABS
ORAL_TABLET | Freq: Two times a day (BID) | ORAL | 11 refills | Status: DC
  Filled 2021-11-12 – 2021-11-13 (×2): qty 60, 30d supply, fill #0
  Filled 2021-11-16 – 2021-12-01 (×2): qty 60, 30d supply, fill #1

## 2021-11-13 ENCOUNTER — Other Ambulatory Visit (HOSPITAL_COMMUNITY): Payer: Self-pay

## 2021-11-15 ENCOUNTER — Ambulatory Visit (HOSPITAL_COMMUNITY): Payer: Medicare HMO

## 2021-11-15 ENCOUNTER — Other Ambulatory Visit (HOSPITAL_COMMUNITY): Payer: Medicare HMO

## 2021-11-16 ENCOUNTER — Other Ambulatory Visit (HOSPITAL_COMMUNITY): Payer: Self-pay

## 2021-11-21 ENCOUNTER — Ambulatory Visit (HOSPITAL_BASED_OUTPATIENT_CLINIC_OR_DEPARTMENT_OTHER)
Admission: RE | Admit: 2021-11-21 | Discharge: 2021-11-21 | Disposition: A | Discharge status: Home / Self Care | Payer: Medicare HMO | Source: Ambulatory Visit | Attending: Cardiology | Admitting: Cardiology

## 2021-11-21 ENCOUNTER — Ambulatory Visit (HOSPITAL_COMMUNITY)
Admission: RE | Admit: 2021-11-21 | Discharge: 2021-11-21 | Disposition: A | Discharge status: Home / Self Care | Payer: Medicare HMO | Source: Ambulatory Visit | Attending: Cardiology | Admitting: Cardiology

## 2021-11-21 DIAGNOSIS — I5021 Acute systolic (congestive) heart failure: Secondary | ICD-10-CM | POA: Diagnosis present

## 2021-11-21 LAB — ECHOCARDIOGRAM COMPLETE
AR max vel: 1.72 cm2
AV Area VTI: 1.79 cm2
AV Area mean vel: 2.03 cm2
AV Mean grad: 5 mmHg
AV Peak grad: 9.4 mmHg
Ao pk vel: 1.54 m/s
Area-P 1/2: 3.48 cm2
Calc EF: 53.7 %
MV M vel: 5.65 m/s
MV Peak grad: 127.7 mmHg
P 1/2 time: 593 msec
Radius: 0.2 cm
S' Lateral: 3.1 cm
Single Plane A2C EF: 55 %
Single Plane A4C EF: 52.5 %

## 2021-11-21 LAB — BASIC METABOLIC PANEL
Anion gap: 8 (ref 5–15)
BUN: 11 mg/dL (ref 8–23)
CO2: 27 mmol/L (ref 22–32)
Calcium: 9 mg/dL (ref 8.9–10.3)
Chloride: 108 mmol/L (ref 98–111)
Creatinine, Ser: 0.94 mg/dL (ref 0.44–1.00)
GFR, Estimated: >60 mL/min (ref >60)
Glucose, Bld: 101 mg/dL — ABNORMAL HIGH (ref 70–99)
Potassium: 3.5 mmol/L (ref 3.5–5.1)
Sodium: 143 mmol/L (ref 135–145)

## 2021-11-21 NOTE — Progress Notes (Signed)
Echocardiogram 2D Echocardiogram has been performed.  Rodrigo Ran 11/21/2021, 1:34 PM

## 2021-11-23 ENCOUNTER — Ambulatory Visit (INDEPENDENT_AMBULATORY_CARE_PROVIDER_SITE_OTHER): Payer: Medicare HMO

## 2021-11-23 DIAGNOSIS — I428 Other cardiomyopathies: Secondary | ICD-10-CM

## 2021-11-24 LAB — CUP PACEART REMOTE DEVICE CHECK
Battery Remaining Longevity: 58 mo
Battery Remaining Percentage: 63 %
Battery Voltage: 2.96 V
Brady Statistic AP VP Percent: 20 %
Brady Statistic AP VS Percent: 1 %
Brady Statistic AS VP Percent: 78 %
Brady Statistic AS VS Percent: 1 %
Brady Statistic RA Percent Paced: 18 %
Date Time Interrogation Session: 20230707020055
HighPow Impedance: 68 Ohm
Implantable Lead Implant Date: 20210409
Implantable Lead Implant Date: 20210409
Implantable Lead Implant Date: 20210409
Implantable Lead Location: 753858
Implantable Lead Location: 753859
Implantable Lead Location: 753860
Implantable Pulse Generator Implant Date: 20210409
Lead Channel Impedance Value: 450 Ohm
Lead Channel Impedance Value: 460 Ohm
Lead Channel Impedance Value: 650 Ohm
Lead Channel Pacing Threshold Amplitude: 0.5 V
Lead Channel Pacing Threshold Amplitude: 0.625 V
Lead Channel Pacing Threshold Amplitude: 1.625 V
Lead Channel Pacing Threshold Pulse Width: 0.5 ms
Lead Channel Pacing Threshold Pulse Width: 0.5 ms
Lead Channel Pacing Threshold Pulse Width: 1 ms
Lead Channel Sensing Intrinsic Amplitude: 11.9 mV
Lead Channel Sensing Intrinsic Amplitude: 3.1 mV
Lead Channel Setting Pacing Amplitude: 1.625
Lead Channel Setting Pacing Amplitude: 2 V
Lead Channel Setting Pacing Amplitude: 2.125
Lead Channel Setting Pacing Pulse Width: 0.5 ms
Lead Channel Setting Pacing Pulse Width: 1 ms
Lead Channel Setting Sensing Sensitivity: 0.5 mV
Pulse Gen Serial Number: 111006673

## 2021-12-01 ENCOUNTER — Other Ambulatory Visit (HOSPITAL_COMMUNITY): Payer: Self-pay

## 2021-12-03 ENCOUNTER — Other Ambulatory Visit (HOSPITAL_COMMUNITY): Payer: Self-pay

## 2021-12-06 ENCOUNTER — Other Ambulatory Visit (HOSPITAL_COMMUNITY): Payer: Self-pay

## 2021-12-07 ENCOUNTER — Other Ambulatory Visit (HOSPITAL_COMMUNITY): Payer: Self-pay

## 2021-12-07 ENCOUNTER — Other Ambulatory Visit: Payer: Self-pay | Admitting: Cardiology

## 2021-12-07 MED ORDER — HYDRALAZINE HCL 50 MG PO TABS
50.0000 mg | ORAL_TABLET | Freq: Two times a day (BID) | ORAL | 0 refills | Status: DC
  Filled 2021-12-07: qty 18060, fill #0
  Filled 2022-03-18: qty 180, 90d supply, fill #0

## 2021-12-10 ENCOUNTER — Encounter: Payer: Medicare HMO | Admitting: Cardiology

## 2021-12-10 NOTE — Progress Notes (Signed)
Remote ICD transmission.   

## 2021-12-10 NOTE — Progress Notes (Deleted)
Electrophysiology Office Note   Date:  12/10/2021   ID:  Ann Bernard, DOB 13-Jan-1946, MRN [id number]  PCP:  Drosinis, Leonia Reader, PA-C  Cardiologist:  Shirlee Latch Primary Electrophysiologist:  Namita Yearwood Jorja Loa, MD    Chief Complaint: CHF   History of Present Illness: Ann Bernard is a 76 y.o. female who is being seen today for the evaluation of CHF at the request of Drosinis, Leonia Reader, PA-C. Presenting today for electrophysiology evaluation.  Has a history significant for hypertension, hyperlipidemia, tobacco abuse, chronic systolic heart failure.  She presented to the hospital December 2020 with heart failure.  Catheterization showed mild coronary artery disease.  She was started on optimal medical therapy.  She is now status post Abbott CRT-D implanted 08/27/2019.  Repeat echo showed an improvement in her ejection fraction to 50%.  Today, denies symptoms of palpitations, chest pain, shortness of breath, orthopnea, PND, lower extremity edema, claudication, dizziness, presyncope, syncope, bleeding, or neurologic sequela. The patient is tolerating medications without difficulties. ***   Past Medical History:  Diagnosis Date   Coronary artery disease    Hypertension    Past Surgical History:  Procedure Laterality Date   BIV ICD INSERTION CRT-D N/A 08/27/2019   Procedure: BIV ICD INSERTION CRT-D;  Surgeon: Regan Lemming, MD;  Location: [facility] INVASIVE CV LAB;  Service: Cardiovascular;  Laterality: N/A;   RIGHT/LEFT HEART CATH AND CORONARY ANGIOGRAPHY N/A 03/16/2019   Procedure: RIGHT/LEFT HEART CATH AND CORONARY ANGIOGRAPHY;  Surgeon: Lyn Records, MD;  Location: [facility] INVASIVE CV LAB;  Service: Cardiovascular;  Laterality: N/A;     Current Outpatient Medications  Medication Sig Dispense Refill   alendronate (FOSAMAX) 70 MG tablet Take 70 mg by mouth once a week. Every Monday     atorvastatin (LIPITOR) 40 MG tablet TAKE 1 TABLET BY MOUTH ONCE A DAY AT 6PM 90 tablet 3   carvedilol  (COREG) 12.5 MG tablet TAKE 1 TABLET BY MOUTH TWO TIMES DAILY 60 tablet 11   dapagliflozin propanediol (FARXIGA) 10 MG TABS tablet Take 1 tablet (10 mg total) by mouth daily before breakfast. 90 tablet 3   diphenhydrAMINE (SOMINEX) 25 MG tablet Take 1 tablet by mouth daily as needed for itching.      furosemide (LASIX) 20 MG tablet Take 1 tablet (20 mg total) by mouth as needed for fluid. 90 tablet 3   hydrALAZINE (APRESOLINE) 50 MG tablet Take 1 tablet by mouth in the morning and at bedtime. 18060 tablet 0   isosorbide mononitrate (IMDUR) 30 MG 24 hr tablet Take 1 tablet by mouth daily. 90 tablet 3   Multiple Vitamins-Minerals (PRESERVISION AREDS 2 PO) Take 1 capsule by mouth daily.     potassium chloride (KLOR-CON) 10 MEQ tablet TAKE 1 TABLET BY MOUTH WHEN YOU TAKE LASIX 90 tablet 1   sacubitril-valsartan (ENTRESTO) 97-103 MG Take 1 tablet by mouth 2 times daily. 60 tablet 2   spironolactone (ALDACTONE) 25 MG tablet TAKE 1 TABLET BY MOUTH ONCE A DAY 90 tablet 2   No current facility-administered medications for this visit.    Allergies:   Aspirin   Social History:  The patient  reports that she has quit smoking. Her smoking use included cigarettes. She has never used smokeless tobacco. She reports that she does not drink alcohol and does not use drugs.   Family History:  The patient's family history includes Alcoholism in her father; Diabetes in her brother and mother; Diabetes type I in her grandson; Hypertension in her mother.  ROS:  Please see the history of present illness.   Otherwise, review of systems is positive for none.   All other systems are reviewed and negative.   PHYSICAL EXAM: VS:  There were no vitals taken for this visit. , BMI There is no height or weight on file to calculate BMI. GEN: Well nourished, well developed, in no acute distress  HEENT: normal  Neck: no JVD, carotid bruits, or masses Cardiac: ***RRR; no murmurs, rubs, or gallops,no edema  Respiratory:  clear  to auscultation bilaterally, normal work of breathing GI: soft, nontender, nondistended, + BS MS: no deformity or atrophy  Skin: warm and dry, device site well healed Neuro:  Strength and sensation are intact Psych: euthymic mood, full affect  EKG:  EKG {ACTION; IS/IS [id number]} ordered today. Personal review of the ekg ordered *** shows ***  Personal review of the device interrogation today. Results in [facility]   Recent Labs: 11/21/2021: BUN 11; Creatinine, Ser 0.94; Potassium 3.5; Sodium 143    Lipid Panel     Component Value Date/Time   CHOL 135 08/15/2021 1122   TRIG 44 08/15/2021 1122   HDL 65 08/15/2021 1122   CHOLHDL 2.1 08/15/2021 1122   VLDL 9 08/15/2021 1122   LDLCALC 61 08/15/2021 1122   LDLDIRECT 138.3 01/25/2009 1524     Wt Readings from Last 3 Encounters:  08/15/21 167 lb 12.8 oz (76.1 kg)  12/09/20 160 lb (72.6 kg)  11/13/20 169 lb (76.7 kg)      Other studies Reviewed: Additional studies/ records that were reviewed today include: TTE 12/02/2019 Review of the above records today demonstrates:   1. Left ventricular ejection fraction, by estimation, is 50%. The left  ventricle has mildly decreased function. The left ventricle demonstrates  global hypokinesis. There is mild left ventricular hypertrophy. Left  ventricular diastolic parameters are  consistent with Grade I diastolic dysfunction (impaired relaxation).   2. Right ventricular systolic function is normal. The right ventricular  size is normal. There is normal pulmonary artery systolic pressure. The  estimated right ventricular systolic pressure is 20.1 mmHg.   3. Left atrial size was mildly dilated.   4. The mitral valve is normal in structure. No evidence of mitral valve  regurgitation. No evidence of mitral stenosis.   5. The aortic valve is tricuspid. Aortic valve regurgitation is trivial.  Mild aortic valve sclerosis is present, with no evidence of aortic valve  stenosis.   6. The inferior  vena cava is normal in size with greater than 50%  respiratory variability, suggesting right atrial pressure of 3 mmHg.   RHC/LHC 03/16/19 Right dominant coronary anatomy. Normal left main. Widely patent LAD with each of 2 diagonals having 50% ostial narrowing. Widely patent circumflex. Minimal luminal irregularities in the proximal mid and distal RCA. Elevated left ventricular end-diastolic pressure, greater than 20 mmHg. Mild pulmonary hypertension with mean pressure 33 mmHg.  Pulmonary capillary wedge mean 23 mmHg.  V wave 33 mmHg.   ASSESSMENT AND PLAN:  1.  Chronic systolic heart failure due to nonischemic cardiomyopathy: Currently on optimal medical therapy with carvedilol, Entresto, Aldactone.  Status post Abbott CRT-D implanted 12/02/2019.  Device function appropriately.  No changes at this time.  2.  Nonobstructive coronary artery disease: No current chest pain.  Continue atorvastatin.  3.  Tobacco abuse: Has quit smoking.   Current medicines are reviewed at length with the patient today.   The patient does not have concerns regarding her medicines.  The following changes  were made today: ***  Labs/ tests ordered today include:  No orders of the defined types were placed in this encounter.     Disposition:   FU with Moyses Pavey *** months  Signed, Ifeoma Vallin Meredith Leeds, MD  12/10/2021 10:08 AM     [address] [place] [address] [phone] (office) [phone] (fax)

## 2021-12-14 ENCOUNTER — Other Ambulatory Visit (HOSPITAL_COMMUNITY): Payer: Self-pay

## 2021-12-15 ENCOUNTER — Other Ambulatory Visit (HOSPITAL_COMMUNITY): Payer: Self-pay

## 2021-12-31 ENCOUNTER — Other Ambulatory Visit (HOSPITAL_COMMUNITY): Payer: Self-pay

## 2021-12-31 ENCOUNTER — Other Ambulatory Visit (HOSPITAL_COMMUNITY): Payer: Self-pay | Admitting: Cardiology

## 2021-12-31 MED ORDER — ATORVASTATIN CALCIUM 40 MG PO TABS
ORAL_TABLET | ORAL | 3 refills | Status: DC
  Filled 2021-12-31: qty 90, 90d supply, fill #0
  Filled 2022-04-29: qty 90, 90d supply, fill #1
  Filled 2022-09-02: qty 90, 90d supply, fill #2
  Filled 2022-09-27 – 2022-12-30 (×2): qty 90, 90d supply, fill #3

## 2022-01-19 ENCOUNTER — Other Ambulatory Visit (HOSPITAL_COMMUNITY): Payer: Self-pay

## 2022-01-22 ENCOUNTER — Other Ambulatory Visit (HOSPITAL_COMMUNITY): Payer: Self-pay

## 2022-01-23 ENCOUNTER — Other Ambulatory Visit (HOSPITAL_COMMUNITY): Payer: Self-pay

## 2022-02-22 ENCOUNTER — Ambulatory Visit (INDEPENDENT_AMBULATORY_CARE_PROVIDER_SITE_OTHER): Payer: Medicare HMO

## 2022-02-22 DIAGNOSIS — I428 Other cardiomyopathies: Secondary | ICD-10-CM | POA: Diagnosis not present

## 2022-02-22 LAB — CUP PACEART REMOTE DEVICE CHECK
Battery Remaining Longevity: 54 mo
Battery Remaining Percentage: 60 %
Battery Voltage: 2.96 V
Brady Statistic AP VP Percent: 19 %
Brady Statistic AP VS Percent: 1 %
Brady Statistic AS VP Percent: 79 %
Brady Statistic AS VS Percent: 1 %
Brady Statistic RA Percent Paced: 17 %
Date Time Interrogation Session: 20231006020321
HighPow Impedance: 73 Ohm
Implantable Lead Implant Date: 20210409
Implantable Lead Implant Date: 20210409
Implantable Lead Implant Date: 20210409
Implantable Lead Location: 753858
Implantable Lead Location: 753859
Implantable Lead Location: 753860
Implantable Pulse Generator Implant Date: 20210409
Lead Channel Impedance Value: 440 Ohm
Lead Channel Impedance Value: 480 Ohm
Lead Channel Impedance Value: 710 Ohm
Lead Channel Pacing Threshold Amplitude: 0.5 V
Lead Channel Pacing Threshold Amplitude: 0.75 V
Lead Channel Pacing Threshold Amplitude: 1.5 V
Lead Channel Pacing Threshold Pulse Width: 0.5 ms
Lead Channel Pacing Threshold Pulse Width: 0.5 ms
Lead Channel Pacing Threshold Pulse Width: 1 ms
Lead Channel Sensing Intrinsic Amplitude: 1.4 mV
Lead Channel Sensing Intrinsic Amplitude: 9.7 mV
Lead Channel Setting Pacing Amplitude: 1.75 V
Lead Channel Setting Pacing Amplitude: 2 V
Lead Channel Setting Pacing Amplitude: 2 V
Lead Channel Setting Pacing Pulse Width: 0.5 ms
Lead Channel Setting Pacing Pulse Width: 1 ms
Lead Channel Setting Sensing Sensitivity: 0.5 mV
Pulse Gen Serial Number: 111006673

## 2022-02-25 ENCOUNTER — Other Ambulatory Visit (HOSPITAL_COMMUNITY): Payer: Self-pay

## 2022-02-25 ENCOUNTER — Other Ambulatory Visit (HOSPITAL_COMMUNITY): Payer: Self-pay | Admitting: Cardiology

## 2022-02-25 MED ORDER — ENTRESTO 97-103 MG PO TABS
1.0000 | ORAL_TABLET | Freq: Two times a day (BID) | ORAL | 0 refills | Status: DC
  Filled 2022-02-25: qty 60, 30d supply, fill #0

## 2022-02-26 ENCOUNTER — Encounter (HOSPITAL_BASED_OUTPATIENT_CLINIC_OR_DEPARTMENT_OTHER): Payer: Self-pay | Admitting: Emergency Medicine

## 2022-02-26 ENCOUNTER — Other Ambulatory Visit: Payer: Self-pay

## 2022-02-26 ENCOUNTER — Emergency Department (HOSPITAL_BASED_OUTPATIENT_CLINIC_OR_DEPARTMENT_OTHER): Payer: Medicare HMO

## 2022-02-26 ENCOUNTER — Emergency Department (HOSPITAL_BASED_OUTPATIENT_CLINIC_OR_DEPARTMENT_OTHER)
Admission: EM | Admit: 2022-02-26 | Discharge: 2022-02-26 | Disposition: A | Discharge status: Home / Self Care | Payer: Medicare HMO | Attending: Emergency Medicine | Admitting: Emergency Medicine

## 2022-02-26 DIAGNOSIS — Z79899 Other long term (current) drug therapy: Secondary | ICD-10-CM | POA: Insufficient documentation

## 2022-02-26 DIAGNOSIS — R0789 Other chest pain: Secondary | ICD-10-CM | POA: Insufficient documentation

## 2022-02-26 DIAGNOSIS — I509 Heart failure, unspecified: Secondary | ICD-10-CM | POA: Insufficient documentation

## 2022-02-26 DIAGNOSIS — I110 Hypertensive heart disease with heart failure: Secondary | ICD-10-CM | POA: Insufficient documentation

## 2022-02-26 LAB — HEPATIC FUNCTION PANEL
ALT: 15 U/L (ref 0–44)
AST: 24 U/L (ref 15–41)
Albumin: 4.2 g/dL (ref 3.5–5.0)
Alkaline Phosphatase: 62 U/L (ref 38–126)
Bilirubin, Direct: 0.1 mg/dL (ref 0.0–0.2)
Indirect Bilirubin: 0.7 mg/dL (ref 0.3–0.9)
Total Bilirubin: 0.8 mg/dL (ref 0.3–1.2)
Total Protein: 7.8 g/dL (ref 6.5–8.1)

## 2022-02-26 LAB — TROPONIN I (HIGH SENSITIVITY)
Troponin I (High Sensitivity): 5 ng/L (ref <18)
Troponin I (High Sensitivity): 6 ng/L (ref <18)

## 2022-02-26 LAB — CBC
HCT: 46.9 % — ABNORMAL HIGH (ref 36.0–46.0)
Hemoglobin: 15.7 g/dL — ABNORMAL HIGH (ref 12.0–15.0)
MCH: 32.9 pg (ref 26.0–34.0)
MCHC: 33.5 g/dL (ref 30.0–36.0)
MCV: 98.3 fL (ref 80.0–100.0)
Platelets: 167 10*3/uL (ref 150–400)
RBC: 4.77 MIL/uL (ref 3.87–5.11)
RDW: 13.1 % (ref 11.5–15.5)
WBC: 5.2 10*3/uL (ref 4.0–10.5)
nRBC: 0 % (ref 0.0–0.2)

## 2022-02-26 LAB — LIPASE, BLOOD: Lipase: 47 U/L (ref 11–51)

## 2022-02-26 LAB — BASIC METABOLIC PANEL
Anion gap: 9 (ref 5–15)
BUN: 13 mg/dL (ref 8–23)
CO2: 22 mmol/L (ref 22–32)
Calcium: 9.4 mg/dL (ref 8.9–10.3)
Chloride: 109 mmol/L (ref 98–111)
Creatinine, Ser: 0.78 mg/dL (ref 0.44–1.00)
GFR, Estimated: >60 mL/min (ref >60)
Glucose, Bld: 107 mg/dL — ABNORMAL HIGH (ref 70–99)
Potassium: 3.2 mmol/L — ABNORMAL LOW (ref 3.5–5.1)
Sodium: 140 mmol/L (ref 135–145)

## 2022-02-26 NOTE — ED Triage Notes (Signed)
Constant central chest pain since this morning. Denies radiation. States the pain feels like indigestion and discomfort. Pain started after eating and at rest. Pt has pacemaker, unsure of brand.

## 2022-02-26 NOTE — Discharge Instructions (Signed)

## 2022-02-26 NOTE — ED Notes (Signed)
Pacemaker interrogated. 

## 2022-02-26 NOTE — Progress Notes (Signed)
Remote ICD transmission.   

## 2022-02-26 NOTE — ED Provider Notes (Signed)
[facility] EMERGENCY DEPARTMENT Provider Note   CSN: [id number] Arrival date & time: 02/26/22  1320     History  Chief Complaint  Patient presents with   Chest Pain    Ann Bernard is a 76 y.o. female who presents emergency department chief complaint of chest pain.  She has a past medical history of essential hypertension, tobacco abuse, ACS, CHF.  Patient states that this morning she drank some coffee shortly thereafter developed pain in her retrosternal area which she describes as aching.  She felt mildly short of breath.  It does not radiate.  She had no diaphoresis.  She denies vomiting.  She felt like she had to burp but could not.  She still has a little bit of discomfort there but has no significant pain at this point.  Pain is nonexertional.  It is not relieved with rest.  She denies a history of gastroesophageal reflux disease.  She denies fevers, chills, shortness of breath.  Right heart cath performed 03/16/2019 showed no significant coronary artery disease.  The history is provided by the patient. No language interpreter was used.  Chest Pain      Home Medications Prior to Admission medications   Medication Sig Start Date End Date Taking? Authorizing Provider  alendronate (FOSAMAX) 70 MG tablet Take 70 mg by mouth once a week. Every Monday 04/26/19   [provider]  atorvastatin (LIPITOR) 40 MG tablet TAKE 1 TABLET BY MOUTH ONCE A DAY AT [facility] 12/31/21 12/31/22  Laurey Morale, MD  carvedilol (COREG) 12.5 MG tablet TAKE 1 TABLET BY MOUTH TWO TIMES DAILY 11/12/21 11/12/22  Laurey Morale, MD  dapagliflozin propanediol (FARXIGA) 10 MG TABS tablet Take 1 tablet (10 mg total) by mouth daily before breakfast. 09/13/21   Laurey Morale, MD  diphenhydrAMINE (SOMINEX) 25 MG tablet Take 1 tablet by mouth daily as needed for itching.     [provider]  furosemide (LASIX) 20 MG tablet Take 1 tablet (20 mg total) by mouth as needed for fluid. 11/01/20  08/16/22  Laurey Morale, MD  hydrALAZINE (APRESOLINE) 50 MG tablet Take 1 tablet by mouth in the morning and at bedtime. 12/07/21   Camnitz, Andree Coss, MD  isosorbide mononitrate (IMDUR) 30 MG 24 hr tablet Take 1 tablet by mouth daily. 03/06/21 08/16/22  Camnitz, Andree Coss, MD  Multiple Vitamins-Minerals (PRESERVISION AREDS 2 PO) Take 1 capsule by mouth daily.    [provider]  potassium chloride (KLOR-CON) 10 MEQ tablet TAKE 1 TABLET BY MOUTH WHEN YOU TAKE LASIX 09/09/19   Bensimhon, Bevelyn Buckles, MD  sacubitril-valsartan (ENTRESTO) 97-103 MG Take 1 tablet by mouth 2 (two) times daily. 02/25/22   Laurey Morale, MD  spironolactone (ALDACTONE) 25 MG tablet TAKE 1 TABLET BY MOUTH ONCE A DAY 07/02/21 07/02/22  Laurey Morale, MD      Allergies    Aspirin    Review of Systems   Review of Systems  Cardiovascular:  Positive for chest pain.    Physical Exam Updated Vital Signs BP 119/75   Pulse 74   Temp 98.7 F (37.1 C) (Oral)   Resp 16   Ht 5\' 7"  (1.702 m)   Wt 75.8 kg   SpO2 99%   BMI 26.16 kg/m  Physical Exam Vitals and nursing note reviewed.  Constitutional:      General: She is not in acute distress.    Appearance: She is well-developed. She is not diaphoretic.  HENT:  Head: Normocephalic and atraumatic.     Right Ear: External ear normal.     Left Ear: External ear normal.     Nose: Nose normal.     Mouth/Throat:     Mouth: Mucous membranes are moist.  Eyes:     General: No scleral icterus.    Conjunctiva/sclera: Conjunctivae normal.  Cardiovascular:     Rate and Rhythm: Normal rate and regular rhythm.     Heart sounds: Normal heart sounds. No murmur heard.    No friction rub. No gallop.  Pulmonary:     Effort: Pulmonary effort is normal. No respiratory distress.     Breath sounds: Normal breath sounds.  Abdominal:     General: Bowel sounds are normal. There is no distension.     Palpations: Abdomen is soft. There is no mass.     Tenderness: There  is no abdominal tenderness. There is no guarding.  Musculoskeletal:     Cervical back: Normal range of motion.  Skin:    General: Skin is warm and dry.  Neurological:     Mental Status: She is alert and oriented to person, place, and time.  Psychiatric:        Behavior: Behavior normal.     ED Results / Procedures / Treatments   Labs (all labs ordered are listed, but only abnormal results are displayed) Labs Reviewed  BASIC METABOLIC PANEL - Abnormal; Notable for the following components:      Result Value   Potassium 3.2 (*)    Glucose, Bld 107 (*)    All other components within normal limits  CBC - Abnormal; Notable for the following components:   Hemoglobin 15.7 (*)    HCT 46.9 (*)    All other components within normal limits  HEPATIC FUNCTION PANEL  LIPASE, BLOOD  TROPONIN I (HIGH SENSITIVITY)  TROPONIN I (HIGH SENSITIVITY)    EKG EKG Interpretation  Date/Time:  Tuesday February 26 2022 13:30:13 EDT Ventricular Rate:  95 PR Interval:  120 QRS Duration: 146 QT Interval:  405 QTC Calculation: 510 R Axis:   236 Text Interpretation: Sinus rhythm Multiple ventricular premature complexes Nonspecific intraventricular conduction delay Consider anterior infarct Confirmed by Sherwood Gambler [phone]) on 02/27/2022 7:43:45 AM  Radiology No results found.  Procedures Procedures    Medications Ordered in ED Medications - No data to display  ED Course/ Medical Decision Making/ A&P                           Medical Decision Making Given the large differential diagnosis for Chabeli Barsamian, the decision making in this case is of high complexity.  After evaluating all of the data points in this case, the presentation of Ann Bernard is NOT consistent with Acute Coronary Syndrome (ACS) and/or myocardial ischemia, pulmonary embolism, aortic dissection; Borhaave's, significant arrythmia, pneumothorax, cardiac tamponade, or other emergent cardiopulmonary condition.  Further,  the presentation of Ann Bernard is NOT consistent with pericarditis, myocarditis, cholecystitis, pancreatitis, mediastinitis, endocarditis, new valvular disease.  Additionally, the presentation of Ann Bernard NOT consistent with flail chest, cardiac contusion, ARDS, or significant intra-thoracic or intra-abdominal bleeding.  Moreover, this presentation is NOT consistent with pneumonia, sepsis, or pyelonephritis.  The patient has  atypical cp- likely GI.   Strict return and follow-up precautions have been given by me personally or by detailed written instruction given verbally by nursing staff using the teach back method to the patient/family/caregiver(s).  Data Reviewed/Counseling: I have  reviewed the patient's vital signs, nursing notes, and other relevant tests/information. I had a detailed discussion regarding the historical points, exam findings, and any diagnostic results supporting the discharge diagnosis. I also discussed the need for outpatient follow-up and the need to return to the ED if symptoms worsen or if there are any questions or concerns that arise at home.    Amount and/or Complexity of Data Reviewed Independent Historian:     Details: Family member at bedside Labs: ordered.    Details: Trop negative x2  Radiology: ordered and independent interpretation performed.    Details: I interpreted cxr- no acute findings ECG/medicine tests: independent interpretation performed.    Details: Sinus rhythm rate of 95           Final Clinical Impression(s) / ED Diagnoses Final diagnoses:  Atypical chest pain    Rx / DC Orders ED Discharge Orders     None         Arthor Captain, PA-C 02/28/22 1639    Melene Plan, DO 03/04/22 1603

## 2022-03-11 ENCOUNTER — Other Ambulatory Visit (HOSPITAL_COMMUNITY): Payer: Self-pay

## 2022-03-11 ENCOUNTER — Other Ambulatory Visit (HOSPITAL_COMMUNITY): Payer: Self-pay | Admitting: Cardiology

## 2022-03-11 MED ORDER — SPIRONOLACTONE 25 MG PO TABS
25.0000 mg | ORAL_TABLET | Freq: Every day | ORAL | 0 refills | Status: DC
  Filled 2022-03-11: qty 90, 90d supply, fill #0

## 2022-03-12 ENCOUNTER — Other Ambulatory Visit (HOSPITAL_COMMUNITY): Payer: Self-pay

## 2022-03-13 ENCOUNTER — Other Ambulatory Visit (HOSPITAL_COMMUNITY): Payer: Self-pay

## 2022-03-18 ENCOUNTER — Other Ambulatory Visit (HOSPITAL_COMMUNITY): Payer: Self-pay

## 2022-04-20 ENCOUNTER — Other Ambulatory Visit (HOSPITAL_COMMUNITY): Payer: Self-pay

## 2022-04-20 ENCOUNTER — Other Ambulatory Visit (HOSPITAL_COMMUNITY): Payer: Self-pay | Admitting: Cardiology

## 2022-04-22 ENCOUNTER — Other Ambulatory Visit (HOSPITAL_COMMUNITY): Payer: Self-pay

## 2022-04-22 MED ORDER — ENTRESTO 97-103 MG PO TABS
1.0000 | ORAL_TABLET | Freq: Two times a day (BID) | ORAL | 0 refills | Status: DC
  Filled 2022-04-22: qty 60, 30d supply, fill #0

## 2022-04-26 ENCOUNTER — Emergency Department (HOSPITAL_BASED_OUTPATIENT_CLINIC_OR_DEPARTMENT_OTHER)
Admission: EM | Admit: 2022-04-26 | Discharge: 2022-04-26 | Disposition: A | Discharge status: Home / Self Care | Payer: Medicare HMO | Attending: Emergency Medicine | Admitting: Emergency Medicine

## 2022-04-26 ENCOUNTER — Emergency Department (HOSPITAL_BASED_OUTPATIENT_CLINIC_OR_DEPARTMENT_OTHER): Payer: Medicare HMO

## 2022-04-26 ENCOUNTER — Encounter (HOSPITAL_BASED_OUTPATIENT_CLINIC_OR_DEPARTMENT_OTHER): Payer: Self-pay | Admitting: Urology

## 2022-04-26 DIAGNOSIS — Z950 Presence of cardiac pacemaker: Secondary | ICD-10-CM | POA: Diagnosis not present

## 2022-04-26 DIAGNOSIS — I2510 Atherosclerotic heart disease of native coronary artery without angina pectoris: Secondary | ICD-10-CM | POA: Diagnosis not present

## 2022-04-26 DIAGNOSIS — Z79899 Other long term (current) drug therapy: Secondary | ICD-10-CM | POA: Diagnosis not present

## 2022-04-26 DIAGNOSIS — Z7984 Long term (current) use of oral hypoglycemic drugs: Secondary | ICD-10-CM | POA: Diagnosis not present

## 2022-04-26 DIAGNOSIS — I509 Heart failure, unspecified: Secondary | ICD-10-CM | POA: Diagnosis not present

## 2022-04-26 DIAGNOSIS — R079 Chest pain, unspecified: Secondary | ICD-10-CM | POA: Diagnosis present

## 2022-04-26 DIAGNOSIS — R072 Precordial pain: Secondary | ICD-10-CM | POA: Insufficient documentation

## 2022-04-26 DIAGNOSIS — I110 Hypertensive heart disease with heart failure: Secondary | ICD-10-CM | POA: Diagnosis not present

## 2022-04-26 LAB — BASIC METABOLIC PANEL
Anion gap: 7 (ref 5–15)
BUN: 11 mg/dL (ref 8–23)
CO2: 26 mmol/L (ref 22–32)
Calcium: 9.1 mg/dL (ref 8.9–10.3)
Chloride: 108 mmol/L (ref 98–111)
Creatinine, Ser: 0.8 mg/dL (ref 0.44–1.00)
GFR, Estimated: >60 mL/min (ref >60)
Glucose, Bld: 90 mg/dL (ref 70–99)
Potassium: 3.3 mmol/L — ABNORMAL LOW (ref 3.5–5.1)
Sodium: 141 mmol/L (ref 135–145)

## 2022-04-26 LAB — TROPONIN I (HIGH SENSITIVITY)
Troponin I (High Sensitivity): 4 ng/L (ref <18)
Troponin I (High Sensitivity): 5 ng/L (ref <18)

## 2022-04-26 LAB — CBC
HCT: 44.2 % (ref 36.0–46.0)
Hemoglobin: 14.7 g/dL (ref 12.0–15.0)
MCH: 32.7 pg (ref 26.0–34.0)
MCHC: 33.3 g/dL (ref 30.0–36.0)
MCV: 98.2 fL (ref 80.0–100.0)
Platelets: 153 10*3/uL (ref 150–400)
RBC: 4.5 MIL/uL (ref 3.87–5.11)
RDW: 12.8 % (ref 11.5–15.5)
WBC: 5.3 10*3/uL (ref 4.0–10.5)
nRBC: 0 % (ref 0.0–0.2)

## 2022-04-26 MED ORDER — LIDOCAINE VISCOUS HCL 2 % MT SOLN
15.0000 mL | Freq: Once | OROMUCOSAL | Status: AC
  Administered 2022-04-26: 15 mL via ORAL
  Filled 2022-04-26: qty 15

## 2022-04-26 MED ORDER — ALUM & MAG HYDROXIDE-SIMETH 200-200-20 MG/5ML PO SUSP
30.0000 mL | Freq: Once | ORAL | Status: AC
  Administered 2022-04-26: 30 mL via ORAL
  Filled 2022-04-26 (×2): qty 30

## 2022-04-26 NOTE — Discharge Instructions (Signed)
Your work up today is reassuring. Symptoms may be related to reflux. Recommend Pepcid twice daily. Recheck with your doctor. Return to the ER for any worsening or concerning symptoms.

## 2022-04-26 NOTE — ED Notes (Signed)
Patient transported to X-ray 

## 2022-04-26 NOTE — ED Provider Notes (Signed)
[facility] EMERGENCY DEPARTMENT Provider Note   CSN: [id number] Arrival date & time: 04/26/22  1434     History  Chief Complaint  Patient presents with   Chest Pain    Ann Bernard is a 76 y.o. female.  76 year old female with past medical history of CAD, HTN, ACS, with a pacemaker presents with complaint of a throbbing sensation, constant in nature since 9:30 am today when she was eating her breakfast. Denies associated nausea, vomiting, changes in bowel or bladder habits, diaphoresis, abdominal pain. Nothing makes pain better or worse, no pain worsening or pain with exertion.   Symptoms similar to her 02/2022 ER visit, no cause found at that time.        Home Medications Prior to Admission medications   Medication Sig Start Date End Date Taking? Authorizing Provider  alendronate (FOSAMAX) 70 MG tablet Take 70 mg by mouth once a week. Every Monday 04/26/19   [provider]  atorvastatin (LIPITOR) 40 MG tablet TAKE 1 TABLET BY MOUTH ONCE A DAY AT [facility] 12/31/21 12/31/22  Laurey Morale, MD  carvedilol (COREG) 12.5 MG tablet TAKE 1 TABLET BY MOUTH TWO TIMES DAILY 11/12/21 11/12/22  Laurey Morale, MD  dapagliflozin propanediol (FARXIGA) 10 MG TABS tablet Take 1 tablet (10 mg total) by mouth daily before breakfast. 09/13/21   Laurey Morale, MD  diphenhydrAMINE (SOMINEX) 25 MG tablet Take 1 tablet by mouth daily as needed for itching.     [provider]  furosemide (LASIX) 20 MG tablet Take 1 tablet (20 mg total) by mouth as needed for fluid. 11/01/20 08/16/22  Laurey Morale, MD  hydrALAZINE (APRESOLINE) 50 MG tablet Take 1 tablet (50 mg total) by mouth in the morning and at bedtime. 12/07/21   Camnitz, Andree Coss, MD  isosorbide mononitrate (IMDUR) 30 MG 24 hr tablet Take 1 tablet by mouth daily. 03/06/21 08/16/22  Camnitz, Andree Coss, MD  Multiple Vitamins-Minerals (PRESERVISION AREDS 2 PO) Take 1 capsule by mouth daily.    [provider]   potassium chloride (KLOR-CON) 10 MEQ tablet TAKE 1 TABLET BY MOUTH WHEN YOU TAKE LASIX 09/09/19   Bensimhon, Bevelyn Buckles, MD  sacubitril-valsartan (ENTRESTO) 97-103 MG Take 1 tablet by mouth 2 (two) times daily. 04/22/22   Laurey Morale, MD  spironolactone (ALDACTONE) 25 MG tablet Take 1 tablet (25 mg total) by mouth daily. 03/11/22   Laurey Morale, MD      Allergies    Aspirin    Review of Systems   Review of Systems Negative except as per HPI Physical Exam Updated Vital Signs BP (!) 144/82   Pulse (!) 57   Temp 98.1 F (36.7 C)   Resp 16   Ht 5\' 7"  (1.702 m)   Wt 75.8 kg   SpO2 100%   BMI 26.17 kg/m  Physical Exam Vitals and nursing note reviewed.  Constitutional:      General: She is not in acute distress.    Appearance: She is well-developed. She is not diaphoretic.  HENT:     Head: Normocephalic and atraumatic.  Cardiovascular:     Rate and Rhythm: Normal rate and regular rhythm.     Heart sounds: Normal heart sounds.  Pulmonary:     Effort: Pulmonary effort is normal.     Breath sounds: Normal breath sounds.  Chest:     Chest wall: Tenderness present.    Abdominal:     Palpations: Abdomen is soft.  Tenderness: There is no abdominal tenderness.  Musculoskeletal:     Cervical back: Neck supple.     Right lower leg: No tenderness. No edema.     Left lower leg: No tenderness. No edema.  Skin:    General: Skin is warm and dry.     Findings: No erythema or rash.  Neurological:     Mental Status: She is alert and oriented to person, place, and time.  Psychiatric:        Behavior: Behavior normal.     ED Results / Procedures / Treatments   Labs (all labs ordered are listed, but only abnormal results are displayed) Labs Reviewed  BASIC METABOLIC PANEL - Abnormal; Notable for the following components:      Result Value   Potassium 3.3 (*)    All other components within normal limits  CBC  TROPONIN I (HIGH SENSITIVITY)  TROPONIN I (HIGH SENSITIVITY)     EKG EKG Interpretation  Date/Time:  Friday April 26 2022 14:42:06 EST Ventricular Rate:  69 PR Interval:  136 QRS Duration: 134 QT Interval:  444 QTC Calculation: 475 R Axis:   154 Text Interpretation: Atrial-sensed ventricular-paced rhythm with frequent AV dual-paced complexes and with occasional Premature ventricular complexes Biventricular pacemaker detected Abnormal ECG No significant change since last tracing Confirmed by Elayne Snare ([id number]) on 04/26/2022 3:46:38 PM  Radiology DG Chest 2 View  Result Date: 04/26/2022 CLINICAL DATA:  Chest pain EXAM: CHEST - 2 VIEW COMPARISON:  Chest x-ray dated February 26, 2022 FINDINGS: Cardiac and mediastinal contours are within normal limits. Left chest wall biventricular pacer with unchanged lead position. Lungs are clear. No pleural effusion or pneumothorax. IMPRESSION: No active cardiopulmonary disease. Electronically Signed   By: Allegra Lai M.D.   On: 04/26/2022 14:55    Procedures Procedures    Medications Ordered in ED Medications  alum & mag hydroxide-simeth (MAALOX/MYLANTA) 200-200-20 MG/5ML suspension 30 mL (30 mLs Oral Given 04/26/22 1612)    And  lidocaine (XYLOCAINE) 2 % viscous mouth solution 15 mL (15 mLs Oral Given 04/26/22 1612)    ED Course/ Medical Decision Making/ A&P                           Medical Decision Making Amount and/or Complexity of Data Reviewed Labs: ordered. Radiology: ordered.  Risk OTC drugs. Prescription drug management.   This patient presents to the ED for concern of chest pain, this involves an extensive number of treatment options, and is a complaint that carries with it a high risk of complications and morbidity.  The differential diagnosis includes but not limited to ACS, NSTEMI, GERD   Co morbidities that complicate the patient evaluation  HTN, CAD, CHF, ACS   Additional history obtained:  Additional history obtained from family at bedside who contributes to  history as above External records from outside source obtained and reviewed including remote pace maker check dated 02/22/22 Echo dated 11/21/21 showing mildly decreased systolic function, EF 50% Device interrogation completed tonight, no reportable events, device working appropriately    Lab Tests:  I Ordered, and personally interpreted labs.  The pertinent results include:  CBC WNL, troponin 4, 5 (no sig change), BMP with mild hypokalemia    Imaging Studies ordered:  I ordered imaging studies including CXR  I independently visualized and interpreted imaging which showed no acute abnormality  I agree with the radiologist interpretation   Cardiac Monitoring: / EKG:  The patient was  maintained on a cardiac monitor.  I personally viewed and interpreted the cardiac monitored which showed an underlying rhythm of: paced, rate 69   Consultations Obtained:  I requested consultation with the ER attending, Dr. Theresia Lo,  and discussed lab and imaging findings as well as pertinent plan - they recommend: agree with plan   Problem List / ED Course / Critical interventions / Medication management  76 year old female presents with concern for throbbing midsternal chest discomfort onset earlier this morning constant since that time as above.  On exam, does have pain with palpation in her center chest.  Her abdomen is soft and nontender.  Her workup is reassuring including device interrogation, EKG, labs and chest x-ray.  Pain has resolved after Maalox and viscous lidocaine.  Discussed with patient importance of follow-up with her primary care provider, can also follow-up with cardiology.  Can trial Pepcid twice daily, as pain may be related to reflux. I ordered medication including Maalox with lidocaine  for chest pain  Reevaluation of the patient after these medicines showed that the patient resolved I have reviewed the patients home medicines and have made adjustments as needed   Social  Determinants of Health:  Lives with family, has PCP as well as specialty follow up   Test / Admission - Considered:  After comprehensive evaluation in the emergency room, patient is felt stable for discharge with plan for follow-up and return to ER precautions provided.         Final Clinical Impression(s) / ED Diagnoses Final diagnoses:  Nonspecific chest pain    Rx / DC Orders ED Discharge Orders     None         Jeannie Fend, PA-C 04/26/22 2145    Elayne Snare K, DO 04/26/22 2250

## 2022-04-26 NOTE — ED Triage Notes (Signed)
Pt states mild mid chest pain  Denies any associated symptoms   Pt has pacemaker since 2020

## 2022-04-29 ENCOUNTER — Other Ambulatory Visit (HOSPITAL_COMMUNITY): Payer: Self-pay

## 2022-05-14 ENCOUNTER — Other Ambulatory Visit (HOSPITAL_COMMUNITY): Payer: Self-pay

## 2022-05-15 ENCOUNTER — Other Ambulatory Visit (HOSPITAL_COMMUNITY): Payer: Self-pay

## 2022-05-24 ENCOUNTER — Ambulatory Visit (INDEPENDENT_AMBULATORY_CARE_PROVIDER_SITE_OTHER): Payer: Medicare HMO

## 2022-05-24 DIAGNOSIS — I428 Other cardiomyopathies: Secondary | ICD-10-CM | POA: Diagnosis not present

## 2022-05-24 LAB — CUP PACEART REMOTE DEVICE CHECK
Battery Remaining Longevity: 52 mo
Battery Remaining Percentage: 57 %
Battery Voltage: 2.96 V
Brady Statistic AP VP Percent: 16 %
Brady Statistic AP VS Percent: 1 %
Brady Statistic AS VP Percent: 82 %
Brady Statistic AS VS Percent: 1 %
Brady Statistic RA Percent Paced: 15 %
Date Time Interrogation Session: 20240105010255
HighPow Impedance: 68 Ohm
Implantable Lead Connection Status: 753985
Implantable Lead Connection Status: 753985
Implantable Lead Connection Status: 753985
Implantable Lead Implant Date: 20210409
Implantable Lead Implant Date: 20210409
Implantable Lead Implant Date: 20210409
Implantable Lead Location: 753858
Implantable Lead Location: 753859
Implantable Lead Location: 753860
Implantable Pulse Generator Implant Date: 20210409
Lead Channel Impedance Value: 430 Ohm
Lead Channel Impedance Value: 450 Ohm
Lead Channel Impedance Value: 660 Ohm
Lead Channel Pacing Threshold Amplitude: 0.5 V
Lead Channel Pacing Threshold Amplitude: 0.625 V
Lead Channel Pacing Threshold Amplitude: 1.625 V
Lead Channel Pacing Threshold Pulse Width: 0.5 ms
Lead Channel Pacing Threshold Pulse Width: 0.5 ms
Lead Channel Pacing Threshold Pulse Width: 1 ms
Lead Channel Sensing Intrinsic Amplitude: 1.3 mV
Lead Channel Sensing Intrinsic Amplitude: 11.9 mV
Lead Channel Setting Pacing Amplitude: 1.625
Lead Channel Setting Pacing Amplitude: 2 V
Lead Channel Setting Pacing Amplitude: 2.125
Lead Channel Setting Pacing Pulse Width: 0.5 ms
Lead Channel Setting Pacing Pulse Width: 1 ms
Lead Channel Setting Sensing Sensitivity: 0.5 mV
Pulse Gen Serial Number: 111006673
Zone Setting Status: 755011

## 2022-05-27 ENCOUNTER — Other Ambulatory Visit (HOSPITAL_COMMUNITY): Payer: Self-pay

## 2022-05-31 ENCOUNTER — Other Ambulatory Visit: Payer: Self-pay

## 2022-05-31 ENCOUNTER — Encounter (HOSPITAL_BASED_OUTPATIENT_CLINIC_OR_DEPARTMENT_OTHER): Payer: Self-pay | Admitting: Emergency Medicine

## 2022-05-31 ENCOUNTER — Emergency Department (HOSPITAL_BASED_OUTPATIENT_CLINIC_OR_DEPARTMENT_OTHER)
Admission: EM | Admit: 2022-05-31 | Discharge: 2022-05-31 | Disposition: A | Discharge status: Home / Self Care | Payer: Medicare PPO | Attending: Emergency Medicine | Admitting: Emergency Medicine

## 2022-05-31 ENCOUNTER — Emergency Department (HOSPITAL_BASED_OUTPATIENT_CLINIC_OR_DEPARTMENT_OTHER): Payer: Medicare PPO

## 2022-05-31 DIAGNOSIS — Z87891 Personal history of nicotine dependence: Secondary | ICD-10-CM | POA: Diagnosis not present

## 2022-05-31 DIAGNOSIS — I5022 Chronic systolic (congestive) heart failure: Secondary | ICD-10-CM | POA: Diagnosis not present

## 2022-05-31 DIAGNOSIS — R079 Chest pain, unspecified: Secondary | ICD-10-CM | POA: Insufficient documentation

## 2022-05-31 DIAGNOSIS — Z95810 Presence of automatic (implantable) cardiac defibrillator: Secondary | ICD-10-CM | POA: Diagnosis not present

## 2022-05-31 DIAGNOSIS — I110 Hypertensive heart disease with heart failure: Secondary | ICD-10-CM | POA: Diagnosis not present

## 2022-05-31 DIAGNOSIS — Z79899 Other long term (current) drug therapy: Secondary | ICD-10-CM | POA: Insufficient documentation

## 2022-05-31 LAB — CBC
HCT: 46.1 % — ABNORMAL HIGH (ref 36.0–46.0)
Hemoglobin: 15.2 g/dL — ABNORMAL HIGH (ref 12.0–15.0)
MCH: 32.4 pg (ref 26.0–34.0)
MCHC: 33 g/dL (ref 30.0–36.0)
MCV: 98.3 fL (ref 80.0–100.0)
Platelets: 159 10*3/uL (ref 150–400)
RBC: 4.69 MIL/uL (ref 3.87–5.11)
RDW: 13.1 % (ref 11.5–15.5)
WBC: 6.7 10*3/uL (ref 4.0–10.5)
nRBC: 0 % (ref 0.0–0.2)

## 2022-05-31 LAB — TROPONIN I (HIGH SENSITIVITY)
Troponin I (High Sensitivity): 5 ng/L (ref <18)
Troponin I (High Sensitivity): 7 ng/L (ref <18)

## 2022-05-31 LAB — BASIC METABOLIC PANEL
Anion gap: 12 (ref 5–15)
BUN: 15 mg/dL (ref 8–23)
CO2: 23 mmol/L (ref 22–32)
Calcium: 9.3 mg/dL (ref 8.9–10.3)
Chloride: 104 mmol/L (ref 98–111)
Creatinine, Ser: 0.86 mg/dL (ref 0.44–1.00)
GFR, Estimated: >60 mL/min (ref >60)
Glucose, Bld: 109 mg/dL — ABNORMAL HIGH (ref 70–99)
Potassium: 3.2 mmol/L — ABNORMAL LOW (ref 3.5–5.1)
Sodium: 139 mmol/L (ref 135–145)

## 2022-05-31 MED ORDER — CYCLOBENZAPRINE HCL 10 MG PO TABS
10.0000 mg | ORAL_TABLET | Freq: Once | ORAL | Status: AC
  Administered 2022-05-31: 10 mg via ORAL
  Filled 2022-05-31: qty 1

## 2022-05-31 MED ORDER — POTASSIUM CHLORIDE CRYS ER 20 MEQ PO TBCR
40.0000 meq | EXTENDED_RELEASE_TABLET | Freq: Once | ORAL | Status: AC
  Administered 2022-05-31: 40 meq via ORAL
  Filled 2022-05-31: qty 2

## 2022-05-31 MED ORDER — CYCLOBENZAPRINE HCL 10 MG PO TABS: 10.0000 mg | ORAL_TABLET | Freq: Two times a day (BID) | ORAL | 0 refills | Status: AC | PRN

## 2022-05-31 NOTE — ED Notes (Signed)
Pt stable at time of discharge. RR even and unlabored.No distress noted. Pt ambulatory to lobby.

## 2022-05-31 NOTE — ED Notes (Signed)
Pt assisted to restroom.  

## 2022-05-31 NOTE — ED Triage Notes (Signed)
Central chest pressure this AM . Feels like unable to burp she said . Denies shortness of breath or coughing .

## 2022-05-31 NOTE — ED Notes (Signed)
Reviewed discharge instructions and follow up with pt and daughter. Pt and daughter states understanding , Pt ambulatory at discharge.

## 2022-05-31 NOTE — ED Provider Notes (Signed)
[facility] EMERGENCY DEPARTMENT Provider Note   CSN: [id number] Arrival date & time: 05/31/22  1249     History {Add pertinent medical, surgical, social history, OB history to HPI:1} Chief Complaint  Patient presents with   Chest Pain    Ann Bernard is a 77 y.o. female.  HPI     77yo female with history of hyperlipidemia, hypertension, former smoker, nonischemic cardiomyopathy/chronic systolic heart failure, [employer] Jude CRT-D device placed,   Dull nagging central chest pain constant since around 7AM.  Constant, not exertional, not positional, not pleuritic, brief pain in back when arrived but not feeling it now, no other radiation of pain. No shortness of breath, no nausea, diaphoresis, dizziness No leg pain or swelling, recnet long trips, surgeries, no hx of DVT/PE No abdominal pain, numbness, weakness Has had pain like this before , was seen about one month ago.  Under a lot of stress, lost brother on Monday.   Past Medical History:  Diagnosis Date   Coronary artery disease    Hypertension      Home Medications Prior to Admission medications   Medication Sig Start Date End Date Taking? Authorizing Provider  alendronate (FOSAMAX) 70 MG tablet Take 70 mg by mouth once a week. Every Monday 04/26/19   [provider]  atorvastatin (LIPITOR) 40 MG tablet TAKE 1 TABLET BY MOUTH ONCE A DAY AT [facility] 12/31/21 12/31/22  Larey Dresser, MD  carvedilol (COREG) 12.5 MG tablet TAKE 1 TABLET BY MOUTH TWO TIMES DAILY 11/12/21 11/12/22  Larey Dresser, MD  dapagliflozin propanediol (FARXIGA) 10 MG TABS tablet Take 1 tablet (10 mg total) by mouth daily before breakfast. 09/13/21   Larey Dresser, MD  diphenhydrAMINE (SOMINEX) 25 MG tablet Take 1 tablet by mouth daily as needed for itching.     [provider]  furosemide (LASIX) 20 MG tablet Take 1 tablet (20 mg total) by mouth as needed for fluid. 11/01/20 08/16/22  Larey Dresser, MD  hydrALAZINE (APRESOLINE)  50 MG tablet Take 1 tablet (50 mg total) by mouth in the morning and at bedtime. 12/07/21   Camnitz, Ocie Doyne, MD  isosorbide mononitrate (IMDUR) 30 MG 24 hr tablet Take 1 tablet by mouth daily. 03/06/21 08/16/22  Camnitz, Ocie Doyne, MD  Multiple Vitamins-Minerals (PRESERVISION AREDS 2 PO) Take 1 capsule by mouth daily.    [provider]  potassium chloride (KLOR-CON) 10 MEQ tablet TAKE 1 TABLET BY MOUTH WHEN YOU TAKE LASIX 09/09/19   Bensimhon, Shaune Pascal, MD  sacubitril-valsartan (ENTRESTO) 97-103 MG Take 1 tablet by mouth 2 (two) times daily. 04/22/22   Larey Dresser, MD  spironolactone (ALDACTONE) 25 MG tablet Take 1 tablet (25 mg total) by mouth daily. 03/11/22   Larey Dresser, MD      Allergies    Aspirin    Review of Systems   Review of Systems  Physical Exam Updated Vital Signs BP 123/85   Pulse (!) 102   Temp 98.2 F (36.8 C) (Oral)   Resp 18   Ht 5\' 7"  (1.702 m)   SpO2 97%   BMI 26.17 kg/m  Physical Exam  ED Results / Procedures / Treatments   Labs (all labs ordered are listed, but only abnormal results are displayed) Labs Reviewed  BASIC METABOLIC PANEL - Abnormal; Notable for the following components:      Result Value   Potassium 3.2 (*)    Glucose, Bld 109 (*)    All other components  within normal limits  CBC - Abnormal; Notable for the following components:   Hemoglobin 15.2 (*)    HCT 46.1 (*)    All other components within normal limits  TROPONIN I (HIGH SENSITIVITY)  TROPONIN I (HIGH SENSITIVITY)    EKG None  Radiology DG Chest 2 View  Result Date: 05/31/2022 CLINICAL DATA:  Chest pain, EXAM: CHEST - 2 VIEW COMPARISON:  April 26, 2022 FINDINGS: The heart size and mediastinal contours are stable. Cardiac pacemaker is stable. Both lungs are clear. The visualized skeletal structures are stable. IMPRESSION: No active cardiopulmonary disease. Electronically Signed   By: Abelardo Diesel M.D.   On: 05/31/2022 13:32     Procedures Procedures  {Document cardiac monitor, telemetry assessment procedure when appropriate:1}  Medications Ordered in ED Medications - No data to display  ED Course/ Medical Decision Making/ A&P   {   Click here for ABCD2, HEART and other calculatorsREFRESH Note before signing :1}                          Medical Decision Making Amount and/or Complexity of Data Reviewed Labs: ordered. Radiology: ordered.  Risk Prescription drug management.   ***  {Document critical care time when appropriate:1} {Document review of labs and clinical decision tools ie heart score, Chads2Vasc2 etc:1}  {Document your independent review of radiology images, and any outside records:1} {Document your discussion with family members, caretakers, and with consultants:1} {Document social determinants of health affecting pt's care:1} {Document your decision making why or why not admission, treatments were needed:1} Final Clinical Impression(s) / ED Diagnoses Final diagnoses:  None    Rx / DC Orders ED Discharge Orders     None

## 2022-06-03 ENCOUNTER — Telehealth (HOSPITAL_COMMUNITY): Payer: Self-pay | Admitting: Vascular Surgery

## 2022-06-03 ENCOUNTER — Other Ambulatory Visit (HOSPITAL_COMMUNITY): Payer: Self-pay | Admitting: Cardiology

## 2022-06-03 ENCOUNTER — Other Ambulatory Visit: Payer: Self-pay

## 2022-06-03 ENCOUNTER — Other Ambulatory Visit (HOSPITAL_COMMUNITY): Payer: Self-pay

## 2022-06-03 MED ORDER — ENTRESTO 97-103 MG PO TABS
1.0000 | ORAL_TABLET | Freq: Two times a day (BID) | ORAL | 0 refills | Status: DC
  Filled 2022-06-03: qty 60, 30d supply, fill #0

## 2022-06-03 NOTE — Telephone Encounter (Signed)
Returned pt call to make f/u appt / VM is full will try back

## 2022-06-07 ENCOUNTER — Other Ambulatory Visit (HOSPITAL_COMMUNITY): Payer: Self-pay

## 2022-06-07 ENCOUNTER — Encounter (HOSPITAL_COMMUNITY): Payer: Self-pay | Admitting: Cardiology

## 2022-06-07 ENCOUNTER — Other Ambulatory Visit: Payer: Self-pay

## 2022-06-07 ENCOUNTER — Ambulatory Visit (HOSPITAL_COMMUNITY)
Admission: RE | Admit: 2022-06-07 | Discharge: 2022-06-07 | Disposition: A | Discharge status: Home / Self Care | Payer: Medicare PPO | Source: Ambulatory Visit | Attending: Cardiology | Admitting: Cardiology

## 2022-06-07 VITALS — BP 132/82 | HR 85 | Wt 161.8 lb

## 2022-06-07 DIAGNOSIS — E876 Hypokalemia: Secondary | ICD-10-CM | POA: Insufficient documentation

## 2022-06-07 DIAGNOSIS — E785 Hyperlipidemia, unspecified: Secondary | ICD-10-CM | POA: Diagnosis not present

## 2022-06-07 DIAGNOSIS — Z79899 Other long term (current) drug therapy: Secondary | ICD-10-CM | POA: Insufficient documentation

## 2022-06-07 DIAGNOSIS — I428 Other cardiomyopathies: Secondary | ICD-10-CM | POA: Insufficient documentation

## 2022-06-07 DIAGNOSIS — I110 Hypertensive heart disease with heart failure: Secondary | ICD-10-CM | POA: Insufficient documentation

## 2022-06-07 DIAGNOSIS — Z720 Tobacco use: Secondary | ICD-10-CM | POA: Insufficient documentation

## 2022-06-07 DIAGNOSIS — I5022 Chronic systolic (congestive) heart failure: Secondary | ICD-10-CM | POA: Diagnosis not present

## 2022-06-07 DIAGNOSIS — I447 Left bundle-branch block, unspecified: Secondary | ICD-10-CM | POA: Diagnosis not present

## 2022-06-07 DIAGNOSIS — I2510 Atherosclerotic heart disease of native coronary artery without angina pectoris: Secondary | ICD-10-CM | POA: Insufficient documentation

## 2022-06-07 MED ORDER — POTASSIUM CHLORIDE CRYS ER 20 MEQ PO TBCR
20.0000 meq | EXTENDED_RELEASE_TABLET | Freq: Every day | ORAL | 3 refills | Status: DC
  Filled 2022-06-07: qty 60, 60d supply, fill #0
  Filled 2022-09-02: qty 90, 90d supply, fill #1
  Filled 2022-09-09 – 2022-12-30 (×2): qty 90, 90d supply, fill #2

## 2022-06-07 MED ORDER — CARVEDILOL 12.5 MG PO TABS
18.7500 mg | ORAL_TABLET | Freq: Two times a day (BID) | ORAL | 11 refills | Status: DC
  Filled 2022-06-07: qty 60, 20d supply, fill #0
  Filled 2022-07-09: qty 60, 20d supply, fill #1
  Filled 2022-09-02: qty 270, 90d supply, fill #2
  Filled 2022-09-27 – 2022-10-28 (×2): qty 270, 90d supply, fill #3
  Filled 2022-11-21: qty 60, 20d supply, fill #4

## 2022-06-07 NOTE — Patient Instructions (Signed)
Good to see you today!  INCREASE Coreg to 18.75 mg ( 1 1/2 tablet)Twice daily  INCREASE Potassium to 20 meq ( 1Tablet) daily  Lab work in 10 days  Your physician recommends that you schedule a follow-up appointment in: 6 months with app clinic( July) Call in May to schedule an appointment  If you have any questions or concerns before your next appointment please send [place] a message through [place] or call our office at [phone].    TO LEAVE A MESSAGE FOR THE NURSE SELECT OPTION 2, PLEASE LEAVE A MESSAGE INCLUDING: YOUR NAME DATE OF BIRTH CALL BACK NUMBER REASON FOR CALL**this is important as we prioritize the call backs  YOU WILL RECEIVE A CALL BACK THE SAME DAY AS LONG AS YOU CALL BEFORE 4:00 PM  At the [facility], you and your health needs are our priority. As part of our continuing mission to provide you with exceptional heart care, we have created designated Provider Care Teams. These Care Teams include your primary Cardiologist (physician) and Advanced Practice Providers (APPs- Physician Assistants and Nurse Practitioners) who all work together to provide you with the care you need, when you need it.   You may see any of the following providers on your designated Care Team at your next follow up: Dr Glori Bickers Dr Loralie Champagne Dr. Roxana Hires, NP Lyda Jester, [place]  [facility] [place], [place] Forestine Na, NP Audry Riles, PharmD   Please be sure to bring in all your medications bottles to every appointment.

## 2022-06-08 NOTE — Progress Notes (Signed)
PCP: Pcp, No HF Cardiology: Dr. Aundra Dubin  Ann Bernard is a 77 y.o. with history of HTN, hyperlipidemia, tobacco abuse, and chronic systolic heart failure. Of note, her brother has heart failure and has an ICD.    Admitted to [facility] in 10/20 with acute systolic heart failure. CTA negative for PE. ECHO was completed and showed severely reduced EF. Diagnostic cath was performed as noted below, mild coronary disease not likely to have caused cardiomyopathy.  Diuresed with IV lasix and transitioned to lasix 20 mg daily. Started on digoxin, carvedilol, and Entresto. Discharge weight 174 pounds.      Echo in 2/21 showed EF 20-25% with septal-lateral dyssynchrony.  Patient had St Jude CRT-D device placed.   Echo in 7/21 showed EF up to 50% with mild LVH, normal RV.  Echo (7/23) showed EF 50%, RV normal, mild MR.   She returns for followup of CHF.  Weight down 6 lbs.  She was in the ER earlier this month with atypical chest pain after getting upset about her brother's death.  Workup was negative and she was sent home.  She has had no further chest pain.  No problems walking up stairs, no dyspnea walking on flat ground.  No orthopnea/PND.   Labs (2/21): digoxin 0.5, K 3.8, creatinine 0.78 Labs (4/21): LDL 64, K 3.6, creatinine 0.73 Labs (7/22): K 3.6, creatinine 0.98 Labs (1/24): K 3.2, creatinine 0.86  ECG (personally reviewed): NSR, BiV pacing  PMH: 1. Chronic systolic CHF: Nonischemic cardiomyopathy.  - Echo (10/20): EF < 20%, moderately dilated LV, severe global HK.  - RHC/LHC (10/20): D1 and D2 with 50% ostial stenosis.  PCWP 23, CI 2.34.  - CMRI (10/20): Moderately dilated LV with EF 13%, diffuse hypokinesis, septal-lateral dyssynchrony, normal RV size with EF 30%, nonspecific RV insertion site LGE.   - Echo (2/21): EF 20-25%, septal-lateral dyssynchrony, mild AI, mild-moderately decreased RV systolic function.  - St Jude CRT-D device placed.  - Echo (7/21): EF 50%, mild LVH, normal RV -  Echo (7/23): EF 50%, RV normal, mild MR 2. Active smoker.  3. Hyperlipidemia 4. HTN 5. ABIs (2/21): Normal.   ROS: All systems negative except as listed in HPI, PMH and Problem List.  SH:  Social History   Socioeconomic History   Marital status: Widowed    Spouse name: Not on file   Number of children: Not on file   Years of education: Not on file   Highest education level: Not on file  Occupational History   Not on file  Tobacco Use   Smoking status: Former    Types: Cigarettes   Smokeless tobacco: Never  Vaping Use   Vaping Use: Never used  Substance and Sexual Activity   Alcohol use: No   Drug use: Never   Sexual activity: Not Currently  Other Topics Concern   Not on file  Social History Narrative   Not on file   Social Determinants of Health   Financial Resource Strain: Low Risk  (08/05/2019)   Overall Financial Resource Strain (CARDIA)    Difficulty of Paying Living Expenses: Not very hard  Food Insecurity: No Food Insecurity (08/05/2019)   Hunger Vital Sign    Worried About Running Out of Food in the Last Year: Never true    Ran Out of Food in the Last Year: Never true  Transportation Needs: No Transportation Needs (08/05/2019)   PRAPARE - Transportation    Lack of Transportation (Medical): No    Lack of  Transportation (Non-Medical): No  Physical Activity: Unknown (03/15/2019)   Exercise Vital Sign    Days of Exercise per Week: 0 days    Minutes of Exercise per Session: Not on file  Stress: No Stress Concern Present (03/15/2019)   [employer] of Occupational Health - Occupational Stress Questionnaire    Feeling of Stress : Not at all  Social Connections: Moderately Isolated (03/15/2019)   Social Connection and Isolation Panel [NHANES]    Frequency of Communication with Friends and Family: More than three times a week    Frequency of Social Gatherings with Friends and Family: Once a week    Attends Religious Services: More than 4 times per year     Active Member of [employer] or Organizations: No    Attends Banker Meetings: Never    Marital Status: Widowed  Intimate Partner Violence: Not At Risk (03/15/2019)   Humiliation, Afraid, Rape, and Kick questionnaire    Fear of Current or Ex-Partner: No    Emotionally Abused: No    Physically Abused: No    Sexually Abused: No    FH:  Family History  Problem Relation Age of Onset   Hypertension Mother    Diabetes Mother    Diabetes Brother    Alcoholism Father    Diabetes type I Grandson     Current Outpatient Medications  Medication Sig Dispense Refill   alendronate (FOSAMAX) 70 MG tablet Take 70 mg by mouth once a week. Every Monday     atorvastatin (LIPITOR) 40 MG tablet TAKE 1 TABLET BY MOUTH ONCE A DAY AT 6PM 90 tablet 3   cyclobenzaprine (FLEXERIL) 10 MG tablet Take 1 tablet (10 mg total) by mouth 2 (two) times daily as needed for muscle spasms. 20 tablet 0   dapagliflozin propanediol (FARXIGA) 10 MG TABS tablet Take 1 tablet (10 mg total) by mouth daily before breakfast. 90 tablet 3   diphenhydrAMINE (SOMINEX) 25 MG tablet Take 1 tablet by mouth daily as needed for itching.      furosemide (LASIX) 20 MG tablet Take 1 tablet (20 mg total) by mouth as needed for fluid. 90 tablet 3   hydrALAZINE (APRESOLINE) 50 MG tablet Take 1 tablet (50 mg total) by mouth in the morning and at bedtime. 180 tablet 0   isosorbide mononitrate (IMDUR) 30 MG 24 hr tablet Take 1 tablet by mouth daily. 90 tablet 3   Multiple Vitamins-Minerals (PRESERVISION AREDS 2 PO) Take 1 capsule by mouth daily.     sacubitril-valsartan (ENTRESTO) 97-103 MG Take 1 tablet by mouth 2 (two) times daily. 60 tablet 0   spironolactone (ALDACTONE) 25 MG tablet Take 1 tablet (25 mg total) by mouth daily. 90 tablet 0   carvedilol (COREG) 12.5 MG tablet Take 1.5 tablets (18.75 mg total) by mouth 2 (two) times daily. (dose increase) 60 tablet 11   potassium chloride SA (KLOR-CON M) 20 MEQ tablet Take 1 tablet (20 mEq  total) by mouth daily. 60 tablet 3   No current facility-administered medications for this encounter.    Vitals:   06/07/22 0933  BP: 132/82  Pulse: 85  SpO2: 100%  Weight: 73.4 kg (161 lb 12.8 oz)   Wt Readings from Last 3 Encounters:  06/07/22 73.4 kg (161 lb 12.8 oz)  04/26/22 75.8 kg (167 lb 1.7 oz)  02/26/22 75.8 kg (167 lb)   PHYSICAL EXAM: General: NAD Neck: No JVD, no thyromegaly or thyroid nodule.  Lungs: Clear to auscultation bilaterally with normal respiratory effort.  CV: Nondisplaced PMI.  Heart regular S1/S2, no S3/S4, no murmur.  No peripheral edema.  No carotid bruit.  Normal pedal pulses.  Abdomen: Soft, nontender, no hepatosplenomegaly, no distention.  Skin: Intact without lesions or rashes.  Neurologic: Alert and oriented x 3.  Psych: Normal affect. Extremities: No clubbing or cyanosis.  HEENT: Normal.   ASSESSMENT & PLAN: 1. Chronic systolic CHF: Nonischemic cardiomyopathy. Echo 10/20 with EF < 20%.  LHC/RHC 10/20 showed nonobstructive coronary disease, CI 2.3.  Cardiac MRI (10/20) showed EF 13%, moderate LV dilation, septal-lateral dyssynchrony c/w LBBB and diffuse severe hypokinesis, normal RV size with moderate systolic dysfunction EF 76%, mild-moderate MR, moderate AI, LGE at inferior RV insertion site (nonspecific). Echo in 2/21 showed that EF remained 20-25%. She has a wide LBBB => St Jude CRT-D device placed. 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  Echo in 7/21 showed improvement in EF up to 50%, Echo in 7/23 with stable EF 50%.  NYHA class I-II symptoms, not volume overloaded on exam.  - Increase Coreg to 18.75 mg bid.  - Continue Entresto 97/103 mg twice a day. BMET today.    - Continue spironolactone to 25 mg daily.  - Continue Farxiga 10 mg daily.  - Continue hydralazine/Imdur.  - Persistent hypokalemia, add KCl 20 daily. BMET 10 days.   2. CAD: Nonobstructive CAD.  - Continue atorvastatin.   BMET 3 months, followup in 6  months with APP.   Loralie Champagne 06/08/2022

## 2022-06-11 NOTE — Progress Notes (Signed)
Remote ICD transmission.   

## 2022-06-14 ENCOUNTER — Encounter: Payer: Self-pay | Admitting: *Deleted

## 2022-06-14 NOTE — Progress Notes (Signed)
Pt son Ann Bernard) request intermittent FMLA to accompany pt to appointments. Forms completed, signed by Dr Aundra Dubin, and faxed into [employer] at [phone]. Beverely Low is aware this was done

## 2022-06-17 ENCOUNTER — Ambulatory Visit (HOSPITAL_COMMUNITY)
Admission: RE | Admit: 2022-06-17 | Discharge: 2022-06-17 | Disposition: A | Discharge status: Home / Self Care | Payer: Medicare PPO | Source: Ambulatory Visit | Attending: Cardiology | Admitting: Cardiology

## 2022-06-17 DIAGNOSIS — I5022 Chronic systolic (congestive) heart failure: Secondary | ICD-10-CM | POA: Insufficient documentation

## 2022-06-17 LAB — BASIC METABOLIC PANEL
Anion gap: 8 (ref 5–15)
BUN: 12 mg/dL (ref 8–23)
CO2: 27 mmol/L (ref 22–32)
Calcium: 9.1 mg/dL (ref 8.9–10.3)
Chloride: 106 mmol/L (ref 98–111)
Creatinine, Ser: 0.9 mg/dL (ref 0.44–1.00)
GFR, Estimated: >60 mL/min (ref >60)
Glucose, Bld: 99 mg/dL (ref 70–99)
Potassium: 4 mmol/L (ref 3.5–5.1)
Sodium: 141 mmol/L (ref 135–145)

## 2022-06-24 ENCOUNTER — Other Ambulatory Visit (HOSPITAL_COMMUNITY): Payer: Self-pay

## 2022-06-24 ENCOUNTER — Other Ambulatory Visit (HOSPITAL_COMMUNITY): Payer: Self-pay | Admitting: Cardiology

## 2022-06-24 ENCOUNTER — Other Ambulatory Visit: Payer: Self-pay

## 2022-06-24 MED ORDER — DAPAGLIFLOZIN PROPANEDIOL 10 MG PO TABS
10.0000 mg | ORAL_TABLET | Freq: Every day | ORAL | 3 refills | Status: AC
  Filled 2022-06-24 – 2022-09-02 (×2): qty 90, 90d supply, fill #0
  Filled 2022-11-22: qty 90, 90d supply, fill #1

## 2022-06-24 MED ORDER — SPIRONOLACTONE 25 MG PO TABS
25.0000 mg | ORAL_TABLET | Freq: Every day | ORAL | 0 refills | Status: DC
  Filled 2022-06-24: qty 90, 90d supply, fill #0

## 2022-06-25 ENCOUNTER — Other Ambulatory Visit (HOSPITAL_COMMUNITY): Payer: Self-pay

## 2022-07-09 ENCOUNTER — Other Ambulatory Visit: Payer: Self-pay | Admitting: Cardiology

## 2022-07-09 ENCOUNTER — Other Ambulatory Visit (HOSPITAL_COMMUNITY): Payer: Self-pay

## 2022-07-09 ENCOUNTER — Other Ambulatory Visit: Payer: Self-pay

## 2022-07-09 MED ORDER — HYDRALAZINE HCL 50 MG PO TABS
50.0000 mg | ORAL_TABLET | Freq: Two times a day (BID) | ORAL | 0 refills | Status: DC
  Filled 2022-07-09: qty 180, 90d supply, fill #0

## 2022-07-28 ENCOUNTER — Other Ambulatory Visit (HOSPITAL_COMMUNITY): Payer: Self-pay | Admitting: Cardiology

## 2022-07-29 ENCOUNTER — Other Ambulatory Visit (HOSPITAL_COMMUNITY): Payer: Self-pay

## 2022-07-29 MED ORDER — ENTRESTO 97-103 MG PO TABS
1.0000 | ORAL_TABLET | Freq: Two times a day (BID) | ORAL | 0 refills | Status: DC
  Filled 2022-07-29: qty 60, 30d supply, fill #0

## 2022-08-06 ENCOUNTER — Other Ambulatory Visit (HOSPITAL_COMMUNITY): Payer: Self-pay

## 2022-08-23 ENCOUNTER — Ambulatory Visit (INDEPENDENT_AMBULATORY_CARE_PROVIDER_SITE_OTHER): Payer: Medicare HMO

## 2022-08-23 DIAGNOSIS — I428 Other cardiomyopathies: Secondary | ICD-10-CM

## 2022-08-23 LAB — CUP PACEART REMOTE DEVICE CHECK
Battery Remaining Longevity: 50 mo
Battery Remaining Percentage: 54 %
Battery Voltage: 2.96 V
Brady Statistic AP VP Percent: 15 %
Brady Statistic AP VS Percent: 1 %
Brady Statistic AS VP Percent: 83 %
Brady Statistic AS VS Percent: 1 %
Brady Statistic RA Percent Paced: 13 %
Date Time Interrogation Session: 20240405030213
HighPow Impedance: 75 Ohm
Implantable Lead Connection Status: 753985
Implantable Lead Connection Status: 753985
Implantable Lead Connection Status: 753985
Implantable Lead Implant Date: 20210409
Implantable Lead Implant Date: 20210409
Implantable Lead Implant Date: 20210409
Implantable Lead Location: 753858
Implantable Lead Location: 753859
Implantable Lead Location: 753860
Implantable Pulse Generator Implant Date: 20210409
Lead Channel Impedance Value: 450 Ohm
Lead Channel Impedance Value: 530 Ohm
Lead Channel Impedance Value: 750 Ohm
Lead Channel Pacing Threshold Amplitude: 0.5 V
Lead Channel Pacing Threshold Amplitude: 0.75 V
Lead Channel Pacing Threshold Amplitude: 1.25 V
Lead Channel Pacing Threshold Pulse Width: 0.5 ms
Lead Channel Pacing Threshold Pulse Width: 0.5 ms
Lead Channel Pacing Threshold Pulse Width: 1 ms
Lead Channel Sensing Intrinsic Amplitude: 1.1 mV
Lead Channel Sensing Intrinsic Amplitude: 2.6 mV
Lead Channel Setting Pacing Amplitude: 1.75 V
Lead Channel Setting Pacing Amplitude: 1.75 V
Lead Channel Setting Pacing Amplitude: 2 V
Lead Channel Setting Pacing Pulse Width: 0.5 ms
Lead Channel Setting Pacing Pulse Width: 1 ms
Lead Channel Setting Sensing Sensitivity: 0.5 mV
Pulse Gen Serial Number: 111006673
Zone Setting Status: 755011

## 2022-09-02 ENCOUNTER — Other Ambulatory Visit (HOSPITAL_COMMUNITY): Payer: Self-pay

## 2022-09-03 ENCOUNTER — Other Ambulatory Visit (HOSPITAL_COMMUNITY): Payer: Self-pay

## 2022-09-09 ENCOUNTER — Other Ambulatory Visit (HOSPITAL_COMMUNITY): Payer: Self-pay

## 2022-09-24 NOTE — Progress Notes (Signed)
Remote ICD transmission.   

## 2022-09-27 ENCOUNTER — Other Ambulatory Visit: Payer: Self-pay

## 2022-10-12 ENCOUNTER — Other Ambulatory Visit (HOSPITAL_COMMUNITY): Payer: Self-pay

## 2022-10-12 ENCOUNTER — Other Ambulatory Visit (HOSPITAL_COMMUNITY): Payer: Self-pay | Admitting: Cardiology

## 2022-10-15 ENCOUNTER — Other Ambulatory Visit (HOSPITAL_COMMUNITY): Payer: Self-pay

## 2022-10-15 ENCOUNTER — Other Ambulatory Visit: Payer: Self-pay

## 2022-10-15 MED ORDER — SPIRONOLACTONE 25 MG PO TABS
25.0000 mg | ORAL_TABLET | Freq: Every day | ORAL | 0 refills | Status: DC
  Filled 2022-10-15 (×2): qty 90, 90d supply, fill #0

## 2022-10-16 ENCOUNTER — Other Ambulatory Visit (HOSPITAL_COMMUNITY): Payer: Self-pay

## 2022-10-17 ENCOUNTER — Telehealth: Payer: Self-pay

## 2022-10-17 NOTE — Telephone Encounter (Signed)
Following alert received from [employer] received for 14 NSVT arrhythmias detected that all appear to be bursts of ventricular lead noise, sent to triage.    Patient is 2 years overdue and needs to see EP MD/EP, APP.

## 2022-10-28 ENCOUNTER — Other Ambulatory Visit: Payer: Self-pay

## 2022-10-28 ENCOUNTER — Other Ambulatory Visit (HOSPITAL_COMMUNITY): Payer: Self-pay

## 2022-10-28 ENCOUNTER — Other Ambulatory Visit: Payer: Self-pay | Admitting: Cardiology

## 2022-10-28 MED ORDER — HYDRALAZINE HCL 50 MG PO TABS
50.0000 mg | ORAL_TABLET | Freq: Two times a day (BID) | ORAL | 0 refills | Status: DC
  Filled 2022-10-28: qty 180, 90d supply, fill #0

## 2022-11-20 ENCOUNTER — Encounter (HOSPITAL_BASED_OUTPATIENT_CLINIC_OR_DEPARTMENT_OTHER): Payer: Self-pay | Admitting: Emergency Medicine

## 2022-11-20 ENCOUNTER — Emergency Department (HOSPITAL_BASED_OUTPATIENT_CLINIC_OR_DEPARTMENT_OTHER): Payer: Medicare PPO

## 2022-11-20 ENCOUNTER — Other Ambulatory Visit: Payer: Self-pay

## 2022-11-20 ENCOUNTER — Emergency Department (HOSPITAL_BASED_OUTPATIENT_CLINIC_OR_DEPARTMENT_OTHER)
Admission: EM | Admit: 2022-11-20 | Discharge: 2022-11-20 | Disposition: A | Discharge status: Home / Self Care | Payer: Medicare PPO | Attending: Emergency Medicine | Admitting: Emergency Medicine

## 2022-11-20 DIAGNOSIS — Z79899 Other long term (current) drug therapy: Secondary | ICD-10-CM | POA: Diagnosis not present

## 2022-11-20 DIAGNOSIS — R079 Chest pain, unspecified: Secondary | ICD-10-CM | POA: Diagnosis present

## 2022-11-20 DIAGNOSIS — Z950 Presence of cardiac pacemaker: Secondary | ICD-10-CM | POA: Insufficient documentation

## 2022-11-20 DIAGNOSIS — Z720 Tobacco use: Secondary | ICD-10-CM | POA: Insufficient documentation

## 2022-11-20 DIAGNOSIS — I10 Essential (primary) hypertension: Secondary | ICD-10-CM | POA: Diagnosis not present

## 2022-11-20 LAB — CBC
HCT: 48 % — ABNORMAL HIGH (ref 36.0–46.0)
Hemoglobin: 16 g/dL — ABNORMAL HIGH (ref 12.0–15.0)
MCH: 32.7 pg (ref 26.0–34.0)
MCHC: 33.3 g/dL (ref 30.0–36.0)
MCV: 98.2 fL (ref 80.0–100.0)
Platelets: 152 10*3/uL (ref 150–400)
RBC: 4.89 MIL/uL (ref 3.87–5.11)
RDW: 13 % (ref 11.5–15.5)
WBC: 5.1 10*3/uL (ref 4.0–10.5)
nRBC: 0 % (ref 0.0–0.2)

## 2022-11-20 LAB — BASIC METABOLIC PANEL
Anion gap: 9 (ref 5–15)
BUN: 19 mg/dL (ref 8–23)
CO2: 24 mmol/L (ref 22–32)
Calcium: 9.4 mg/dL (ref 8.9–10.3)
Chloride: 105 mmol/L (ref 98–111)
Creatinine, Ser: 1.01 mg/dL — ABNORMAL HIGH (ref 0.44–1.00)
GFR, Estimated: 57 mL/min — ABNORMAL LOW (ref >60)
Glucose, Bld: 120 mg/dL — ABNORMAL HIGH (ref 70–99)
Potassium: 3.7 mmol/L (ref 3.5–5.1)
Sodium: 138 mmol/L (ref 135–145)

## 2022-11-20 LAB — TROPONIN I (HIGH SENSITIVITY)
Troponin I (High Sensitivity): 5 ng/L (ref <18)
Troponin I (High Sensitivity): 5 ng/L (ref <18)

## 2022-11-20 MED ORDER — FAMOTIDINE 20 MG PO TABS
20.0000 mg | ORAL_TABLET | Freq: Two times a day (BID) | ORAL | 0 refills | Status: DC
  Filled 2022-11-20: qty 60, 30d supply, fill #0

## 2022-11-20 MED ORDER — FAMOTIDINE 20 MG PO TABS
20.0000 mg | ORAL_TABLET | Freq: Once | ORAL | Status: AC
  Administered 2022-11-20: 20 mg via ORAL
  Filled 2022-11-20: qty 1

## 2022-11-20 MED ORDER — ALUM & MAG HYDROXIDE-SIMETH 200-200-20 MG/5ML PO SUSP
30.0000 mL | Freq: Once | ORAL | Status: AC
  Administered 2022-11-20: 30 mL via ORAL
  Filled 2022-11-20: qty 30

## 2022-11-20 NOTE — ED Notes (Signed)
ED Provider at bedside. 

## 2022-11-20 NOTE — ED Provider Notes (Signed)
EMERGENCY DEPARTMENT AT [facility] Provider Note   CSN: [id number] Arrival date & time: 11/20/22  1751     History  Chief Complaint  Patient presents with   Chest Pain    Ann Bernard is a 77 y.o. female with history of nonischemic cardiomyopathy status post pacemaker placement, hypertension, hyperlipidemia, tobacco use, presented to ED complaining of chest pressure.  Patient reports that she woke up feeling fine and later this morning while she was at rest began to develop substernal chest pressure.  This pressure has been constant.  It is not worse with any movement, or inspiration.  It does not radiate anywhere.  She has never had a before.  She denies lightheadedness, shortness of breath, or any other symptoms.  She is here with her son at bedside.  I reviewed the patient's electronic records, seen by cardiology EP service last in 2022, noted to have Abbott CRT-D implanted 08/27/19, with low EF on echocardiogram.  HPI     Home Medications Prior to Admission medications   Medication Sig Start Date End Date Taking? Authorizing Provider  famotidine (PEPCID) 20 MG tablet Take 1 tablet (20 mg total) by mouth 2 (two) times daily. 11/20/22 12/20/22 Yes Luisdavid Hamblin, Kermit Balo, MD  alendronate (FOSAMAX) 70 MG tablet Take 70 mg by mouth once a week. Every Monday 04/26/19   [provider]  atorvastatin (LIPITOR) 40 MG tablet TAKE 1 TABLET BY MOUTH ONCE A DAY AT [facility] 12/31/21 12/31/22  Laurey Morale, MD  carvedilol (COREG) 12.5 MG tablet Take 1.5 tablets (18.75 mg total) by mouth 2 (two) times daily. (dose increase) 06/07/22   Laurey Morale, MD  cyclobenzaprine (FLEXERIL) 10 MG tablet Take 1 tablet (10 mg total) by mouth 2 (two) times daily as needed for muscle spasms. 05/31/22   Alvira Monday, MD  dapagliflozin propanediol (FARXIGA) 10 MG TABS tablet Take 1 tablet (10 mg total) by mouth daily before breakfast. 06/24/22   Laurey Morale, MD  diphenhydrAMINE (SOMINEX)  25 MG tablet Take 1 tablet by mouth daily as needed for itching.     [provider]  furosemide (LASIX) 20 MG tablet Take 1 tablet (20 mg total) by mouth as needed for fluid. 11/01/20 08/16/22  Laurey Morale, MD  hydrALAZINE (APRESOLINE) 50 MG tablet Take 1 tablet (50 mg total) by mouth in the morning and at bedtime. 10/28/22   Laurey Morale, MD  isosorbide mononitrate (IMDUR) 30 MG 24 hr tablet Take 1 tablet by mouth daily. 03/06/21 08/16/22  Camnitz, Andree Coss, MD  Multiple Vitamins-Minerals (PRESERVISION AREDS 2 PO) Take 1 capsule by mouth daily.    [provider]  potassium chloride SA (KLOR-CON M) 20 MEQ tablet Take 1 tablet (20 mEq total) by mouth daily. 06/07/22   Laurey Morale, MD  sacubitril-valsartan (ENTRESTO) 97-103 MG Take 1 tablet by mouth 2 (two) times daily. 07/29/22   Laurey Morale, MD  spironolactone (ALDACTONE) 25 MG tablet Take 1 tablet (25 mg total) by mouth daily. 10/15/22   Laurey Morale, MD      Allergies    Aspirin    Review of Systems   Review of Systems  Physical Exam Updated Vital Signs BP 125/76   Pulse 64   Temp 98.5 F (36.9 C) (Oral)   Resp 15   Ht 5\' 7"  (1.702 m)   Wt 75.8 kg   SpO2 100%   BMI 26.16 kg/m  Physical Exam Constitutional:  General: She is not in acute distress. HENT:     Head: Normocephalic and atraumatic.  Eyes:     Conjunctiva/sclera: Conjunctivae normal.     Pupils: Pupils are equal, round, and reactive to light.  Cardiovascular:     Rate and Rhythm: Normal rate and regular rhythm.  Pulmonary:     Effort: Pulmonary effort is normal. No respiratory distress.  Abdominal:     General: There is no distension.     Tenderness: There is no abdominal tenderness.  Skin:    General: Skin is warm and dry.  Neurological:     General: No focal deficit present.     Mental Status: She is alert. Mental status is at baseline.  Psychiatric:        Mood and Affect: Mood normal.        Behavior: Behavior  normal.     ED Results / Procedures / Treatments   Labs (all labs ordered are listed, but only abnormal results are displayed) Labs Reviewed  BASIC METABOLIC PANEL - Abnormal; Notable for the following components:      Result Value   Glucose, Bld 120 (*)    Creatinine, Ser 1.01 (*)    GFR, Estimated 57 (*)    All other components within normal limits  CBC - Abnormal; Notable for the following components:   Hemoglobin 16.0 (*)    HCT 48.0 (*)    All other components within normal limits  TROPONIN I (HIGH SENSITIVITY)  TROPONIN I (HIGH SENSITIVITY)    EKG None  Radiology DG Chest 2 View  Result Date: 11/20/2022 CLINICAL DATA:  Chest pain EXAM: CHEST - 2 VIEW COMPARISON:  Chest radiograph dated 05/31/2022 FINDINGS: Lines/tubes: Left chest wall ICD leads project over the right atrium and ventricle and tributary of the coronary sinus. Lungs: Mildly hyperinflated lungs. Ovoid radiodensity projecting over the superior endplate of T12. Pleura: No pneumothorax or pleural effusion. Heart/mediastinum: The heart size and mediastinal contours are within normal limits. Bones: No acute osseous abnormality. IMPRESSION: 1. No active cardiopulmonary disease. 2. Mildly hyperinflated lungs, which can be seen in the setting of COPD. 3. Ovoid radiodensity projecting over the superior endplate of T12 on lateral view likely represents superimposition of disc osteophyte. Electronically Signed   By: Agustin Cree M.D.   On: 11/20/2022 18:33    Procedures Procedures    Medications Ordered in ED Medications  alum & mag hydroxide-simeth (MAALOX/MYLANTA) 200-200-20 MG/5ML suspension 30 mL (30 mLs Oral Given 11/20/22 2022)  famotidine (PEPCID) tablet 20 mg (20 mg Oral Given 11/20/22 2019)    ED Course/ Medical Decision Making/ A&P Clinical Course as of 11/20/22 2328  Wed Nov 20, 2022  2027 HR 70 bpm, no tachycardia [MT]  2148 Patient is feeling better with the GI medications.  Delta troponins are negative.  On  telemetry she has maintained a heart rate between 60 and 75 bpm, sinus rhythm.  Okay for discharge [MT]    Clinical Course User Index [MT] Jekhi Bolin, Kermit Balo, MD                             Medical Decision Making Amount and/or Complexity of Data Reviewed Labs: ordered. Radiology: ordered.  Risk OTC drugs.   This patient presents to the Emergency Department with complaint of chest pain. This involves an extensive number of treatment options, and is a complaint that carries with it a high risk of complications and morbidity, given  the patient's comorbidity, including hypertension, hyperlipidemia.The differential diagnosis includes ACS vs Pneumothorax vs Reflux/Gastritis vs MSK pain vs Pneumonia vs other.  I felt PE was less likely given that patient has no  I ordered, reviewed, and interpreted labs.  Pertinent results include unremarkable labs I ordered medication reflux medications for chest pain I ordered imaging studies which included x-ray of the chest I independently visualized and interpreted imaging which showed no emergent findings and the monitor tracing which showed sinus rhythm. I agree with the radiologist interpretation Additional history was obtained from patient's son at bedside External records obtained and reviewed showing cardiology evaluation and echocardiogram I personally reviewed the patients ECG which showed V paced rhythm  After the interventions stated above, I reevaluated the patient and found that they were symptomatically improved  Based on the patient's clinical exam, vital signs, risk factors, and ED testing, I felt that the patient's overall risk of life-threatening emergency such as ACS, PE, sepsis, or infection was low.  At this time, I felt the patient's presentation was most clinically consistent with reflux gastritis, but explained to the patient that this evaluation was not a definitive diagnostic workup.  I discussed outpatient follow up with  primary care provider, and provided specialist office number on the patient's discharge paper if a referral was deemed necessary.  Return precautions were discussed with the patient.  I felt the patient was clinically stable for discharge.         Final Clinical Impression(s) / ED Diagnoses Final diagnoses:  Nonspecific chest pain    Rx / DC Orders ED Discharge Orders          Ordered    famotidine (PEPCID) 20 MG tablet  2 times daily        11/20/22 2149              Terald Sleeper, MD 11/20/22 2328

## 2022-11-20 NOTE — ED Notes (Signed)
Attempted IV start, labs obtained, vein blew with flush

## 2022-11-20 NOTE — ED Triage Notes (Signed)
Pt states she started having sternal CP this morning, describes it as sharp, does not radiate, denies any ShoB, n/v, diaphoresis, weakness, dizziness or other sxs

## 2022-11-22 ENCOUNTER — Ambulatory Visit (INDEPENDENT_AMBULATORY_CARE_PROVIDER_SITE_OTHER): Payer: Medicare PPO

## 2022-11-22 ENCOUNTER — Other Ambulatory Visit (HOSPITAL_COMMUNITY): Payer: Self-pay

## 2022-11-22 ENCOUNTER — Other Ambulatory Visit: Payer: Self-pay

## 2022-11-22 DIAGNOSIS — I428 Other cardiomyopathies: Secondary | ICD-10-CM

## 2022-11-23 LAB — CUP PACEART REMOTE DEVICE CHECK
Battery Remaining Longevity: 47 mo
Battery Remaining Percentage: 51 %
Battery Voltage: 2.95 V
Brady Statistic AP VP Percent: 14 %
Brady Statistic AP VS Percent: 1 %
Brady Statistic AS VP Percent: 83 %
Brady Statistic AS VS Percent: 1 %
Brady Statistic RA Percent Paced: 12 %
Date Time Interrogation Session: 20240705020328
HighPow Impedance: 66 Ohm
Implantable Lead Connection Status: 753985
Implantable Lead Connection Status: 753985
Implantable Lead Connection Status: 753985
Implantable Lead Implant Date: 20210409
Implantable Lead Implant Date: 20210409
Implantable Lead Implant Date: 20210409
Implantable Lead Location: 753858
Implantable Lead Location: 753859
Implantable Lead Location: 753860
Implantable Pulse Generator Implant Date: 20210409
Lead Channel Impedance Value: 440 Ohm
Lead Channel Impedance Value: 510 Ohm
Lead Channel Impedance Value: 730 Ohm
Lead Channel Pacing Threshold Amplitude: 0.5 V
Lead Channel Pacing Threshold Amplitude: 0.625 V
Lead Channel Pacing Threshold Amplitude: 1.5 V
Lead Channel Pacing Threshold Pulse Width: 0.5 ms
Lead Channel Pacing Threshold Pulse Width: 0.5 ms
Lead Channel Pacing Threshold Pulse Width: 1 ms
Lead Channel Sensing Intrinsic Amplitude: 12 mV
Lead Channel Sensing Intrinsic Amplitude: 2.7 mV
Lead Channel Setting Pacing Amplitude: 1.625
Lead Channel Setting Pacing Amplitude: 2 V
Lead Channel Setting Pacing Amplitude: 2 V
Lead Channel Setting Pacing Pulse Width: 0.5 ms
Lead Channel Setting Pacing Pulse Width: 1 ms
Lead Channel Setting Sensing Sensitivity: 0.5 mV
Pulse Gen Serial Number: 111006673
Zone Setting Status: 755011

## 2022-11-24 ENCOUNTER — Emergency Department (HOSPITAL_BASED_OUTPATIENT_CLINIC_OR_DEPARTMENT_OTHER): Payer: Medicare PPO

## 2022-11-24 ENCOUNTER — Emergency Department (HOSPITAL_BASED_OUTPATIENT_CLINIC_OR_DEPARTMENT_OTHER)
Admission: EM | Admit: 2022-11-24 | Discharge: 2022-11-24 | Disposition: A | Discharge status: Home / Self Care | Payer: Medicare PPO | Attending: Emergency Medicine | Admitting: Emergency Medicine

## 2022-11-24 ENCOUNTER — Encounter (HOSPITAL_BASED_OUTPATIENT_CLINIC_OR_DEPARTMENT_OTHER): Payer: Self-pay

## 2022-11-24 ENCOUNTER — Other Ambulatory Visit: Payer: Self-pay

## 2022-11-24 DIAGNOSIS — I5020 Unspecified systolic (congestive) heart failure: Secondary | ICD-10-CM | POA: Diagnosis not present

## 2022-11-24 DIAGNOSIS — I2510 Atherosclerotic heart disease of native coronary artery without angina pectoris: Secondary | ICD-10-CM | POA: Diagnosis not present

## 2022-11-24 DIAGNOSIS — I110 Hypertensive heart disease with heart failure: Secondary | ICD-10-CM | POA: Insufficient documentation

## 2022-11-24 DIAGNOSIS — R7989 Other specified abnormal findings of blood chemistry: Secondary | ICD-10-CM | POA: Insufficient documentation

## 2022-11-24 DIAGNOSIS — R079 Chest pain, unspecified: Secondary | ICD-10-CM

## 2022-11-24 DIAGNOSIS — Z79899 Other long term (current) drug therapy: Secondary | ICD-10-CM | POA: Insufficient documentation

## 2022-11-24 DIAGNOSIS — K219 Gastro-esophageal reflux disease without esophagitis: Secondary | ICD-10-CM | POA: Diagnosis not present

## 2022-11-24 DIAGNOSIS — R072 Precordial pain: Secondary | ICD-10-CM | POA: Diagnosis present

## 2022-11-24 LAB — BASIC METABOLIC PANEL
Anion gap: 9 (ref 5–15)
BUN: 14 mg/dL (ref 8–23)
CO2: 23 mmol/L (ref 22–32)
Calcium: 9.2 mg/dL (ref 8.9–10.3)
Chloride: 105 mmol/L (ref 98–111)
Creatinine, Ser: 1.01 mg/dL — ABNORMAL HIGH (ref 0.44–1.00)
GFR, Estimated: 57 mL/min — ABNORMAL LOW (ref >60)
Glucose, Bld: 99 mg/dL (ref 70–99)
Potassium: 3.8 mmol/L (ref 3.5–5.1)
Sodium: 137 mmol/L (ref 135–145)

## 2022-11-24 LAB — CBC
HCT: 47.7 % — ABNORMAL HIGH (ref 36.0–46.0)
Hemoglobin: 15.8 g/dL — ABNORMAL HIGH (ref 12.0–15.0)
MCH: 32.7 pg (ref 26.0–34.0)
MCHC: 33.1 g/dL (ref 30.0–36.0)
MCV: 98.8 fL (ref 80.0–100.0)
Platelets: 152 10*3/uL (ref 150–400)
RBC: 4.83 MIL/uL (ref 3.87–5.11)
RDW: 12.9 % (ref 11.5–15.5)
WBC: 6.1 10*3/uL (ref 4.0–10.5)
nRBC: 0 % (ref 0.0–0.2)

## 2022-11-24 LAB — TROPONIN I (HIGH SENSITIVITY)
Troponin I (High Sensitivity): 3 ng/L (ref <18)
Troponin I (High Sensitivity): 3 ng/L (ref <18)

## 2022-11-24 LAB — D-DIMER, QUANTITATIVE: D-Dimer, Quant: 1.51 ug/mL-FEU — ABNORMAL HIGH (ref 0.00–0.50)

## 2022-11-24 MED ORDER — PANTOPRAZOLE SODIUM 40 MG IV SOLR
40.0000 mg | Freq: Once | INTRAVENOUS | Status: AC
  Administered 2022-11-24: 40 mg via INTRAVENOUS
  Filled 2022-11-24: qty 10

## 2022-11-24 MED ORDER — ALUM & MAG HYDROXIDE-SIMETH 200-200-20 MG/5ML PO SUSP
30.0000 mL | Freq: Once | ORAL | Status: AC
  Administered 2022-11-24: 30 mL via ORAL
  Filled 2022-11-24: qty 30

## 2022-11-24 MED ORDER — IOHEXOL 350 MG/ML SOLN
100.0000 mL | Freq: Once | INTRAVENOUS | Status: AC | PRN
  Administered 2022-11-24: 75 mL via INTRAVENOUS

## 2022-11-24 NOTE — ED Triage Notes (Signed)
Patient having sternal chest pain that started this morning. She was seen here for the same on 7/3. She denied SHOB, dizziness or weakness.

## 2022-11-24 NOTE — ED Provider Notes (Signed)
[place] EMERGENCY DEPARTMENT AT [facility] Provider Note   CSN: [id number] Arrival date & time: 11/24/22  1423     History  Chief Complaint  Patient presents with   Chest Pain    Ann Bernard is a 77 y.o. female with HTN, systolic heart failure, nonischemic cardiomyopathy s/p ICD placement, HLD, thrombocytopenia, tobacco dependence, uterine prolapse who presents with sub-sternal chest pain "tightness" that started this morning while she was at rest. Doesn't radiate. She was seen here for the same on 7/3, had negative troponins and was vitally stable, felt improved after GI cocktail and Pepcid. Hasn't been able to pick up her pepcid yet from pharmacy. She denied SHOB, dizziness, leg swelling, numbness/tingling, or weakness. Last seen by cardiology EP service last in 2022, noted to have Abbott CRT-D implanted 08/27/19, with low EF on echocardiogram. Has f/u appt on 12/24/22. No shocks by ICD.   Chest Pain      Home Medications Prior to Admission medications   Medication Sig Start Date End Date Taking? Authorizing Provider  alendronate (FOSAMAX) 70 MG tablet Take 70 mg by mouth once a week. Every Monday 04/26/19   [provider]  atorvastatin (LIPITOR) 40 MG tablet TAKE 1 TABLET BY MOUTH ONCE A DAY AT [facility] 12/31/21 12/31/22  Laurey Morale, MD  carvedilol (COREG) 12.5 MG tablet Take 1.5 tablets (18.75 mg total) by mouth 2 (two) times daily. (dose increase) 06/07/22   Laurey Morale, MD  cyclobenzaprine (FLEXERIL) 10 MG tablet Take 1 tablet (10 mg total) by mouth 2 (two) times daily as needed for muscle spasms. 05/31/22   Alvira Monday, MD  dapagliflozin propanediol (FARXIGA) 10 MG TABS tablet Take 1 tablet (10 mg total) by mouth daily before breakfast. 06/24/22   Laurey Morale, MD  diphenhydrAMINE (SOMINEX) 25 MG tablet Take 1 tablet by mouth daily as needed for itching.     [provider]  famotidine (PEPCID) 20 MG tablet Take 1 tablet (20 mg total) by  mouth 2 (two) times daily. 11/20/22 12/22/22  Terald Sleeper, MD  furosemide (LASIX) 20 MG tablet Take 1 tablet (20 mg total) by mouth as needed for fluid. 11/01/20 08/16/22  Laurey Morale, MD  hydrALAZINE (APRESOLINE) 50 MG tablet Take 1 tablet (50 mg total) by mouth in the morning and at bedtime. 10/28/22   Laurey Morale, MD  isosorbide mononitrate (IMDUR) 30 MG 24 hr tablet Take 1 tablet by mouth daily. 03/06/21 08/16/22  Camnitz, Andree Coss, MD  Multiple Vitamins-Minerals (PRESERVISION AREDS 2 PO) Take 1 capsule by mouth daily.    [provider]  potassium chloride SA (KLOR-CON M) 20 MEQ tablet Take 1 tablet (20 mEq total) by mouth daily. 06/07/22   Laurey Morale, MD  sacubitril-valsartan (ENTRESTO) 97-103 MG Take 1 tablet by mouth 2 (two) times daily. 07/29/22   Laurey Morale, MD  spironolactone (ALDACTONE) 25 MG tablet Take 1 tablet (25 mg total) by mouth daily. 10/15/22   Laurey Morale, MD      Allergies    Aspirin    Review of Systems   Review of Systems  Cardiovascular:  Positive for chest pain.   A 10 point review of systems was performed and is negative unless otherwise reported in HPI.  Physical Exam Updated Vital Signs BP 123/76   Pulse 60   Temp 97.9 F (36.6 C) (Oral)   Resp 16   Ht 5\' 7"  (1.702 m)   Wt 75 kg  SpO2 99%   BMI 25.90 kg/m  Physical Exam General: Normal appearing female, lying in bed.  HEENT: Sclera anicteric, MMM, trachea midline.  Cardiology: RRR, no murmurs/rubs/gallops. Resp: Normal respiratory rate and effort. CTAB, no wheezes, rhonchi, crackles.  Abd: Soft, non-tender, non-distended. No rebound tenderness or guarding.  GU: Deferred. MSK: No peripheral edema or signs of trauma. Extremities without deformity or TTP. No cyanosis or clubbing. Skin: warm, dry.  Neuro: A&Ox4, CNs II-XII grossly intact. MAEs. Sensation grossly intact.  Psych: Normal mood and affect.   ED Results / Procedures / Treatments   Labs (all labs  ordered are listed, but only abnormal results are displayed) Labs Reviewed  BASIC METABOLIC PANEL - Abnormal; Notable for the following components:      Result Value   Creatinine, Ser 1.01 (*)    GFR, Estimated 57 (*)    All other components within normal limits  CBC - Abnormal; Notable for the following components:   Hemoglobin 15.8 (*)    HCT 47.7 (*)    All other components within normal limits  D-DIMER, QUANTITATIVE - Abnormal; Notable for the following components:   D-Dimer, Quant 1.51 (*)    All other components within normal limits  TROPONIN I (HIGH SENSITIVITY)  TROPONIN I (HIGH SENSITIVITY)    EKG EKG Interpretation Date/Time:  Sunday November 24 2022 14:29:29 EDT Ventricular Rate:  92 PR Interval:  128 QRS Duration:  137 QT Interval:  372 QTC Calculation: 461 R Axis:   243  Text Interpretation: A sensed V paced rhythm Nonspecific IVCD with LAD Inferior infarct, old Anterior infarct, old Confirmed by Vivi Barrack [phone]) on 11/24/2022 3:20:07 PM  Radiology CT Angio Chest PE W and/or Wo Contrast  Result Date: 11/24/2022 CLINICAL DATA:  Pulmonary embolism (PE) suspected, low to intermediate prob, positive D-dimer EXAM: CT ANGIOGRAPHY CHEST WITH CONTRAST TECHNIQUE: Multidetector CT imaging of the chest was performed using the standard protocol during bolus administration of intravenous contrast. Multiplanar CT image reconstructions and MIPs were obtained to evaluate the vascular anatomy. RADIATION DOSE REDUCTION: This exam was performed according to the departmental dose-optimization program which includes automated exposure control, adjustment of the mA and/or kV according to patient size and/or use of iterative reconstruction technique. CONTRAST:  75mL OMNIPAQUE IOHEXOL 350 MG/ML SOLN COMPARISON:  Radiograph earlier today.  Chest CT 03/14/2019 FINDINGS: The mid upper lobes and apices are not included in the field of view, elected not to repeat the exam, exam is diagnostic for  pulmonary embolus assessment. Cardiovascular: There are no filling defects within the pulmonary arteries to suggest pulmonary embolus. Mild atherosclerosis of the thoracic aorta without aneurysm. Pacemaker leads in the right atrium, ventricle and coronary sinus. The heart is normal in size. There is no pericardial effusion. Mediastinum/Nodes: No mediastinal or hilar adenopathy. Decompressed esophagus. Lungs/Pleura: Mid aspect in apices of the upper lobes not included in the field of view. The lungs are clear. No focal airspace disease, pleural effusion or features of pulmonary edema. No suspicious pulmonary nodule. Upper Abdomen: Calcified granuloma in the right dome of the liver. Small hepatic cysts. No acute upper abdominal findings. Musculoskeletal: Thoracic spondylosis with spurring. There are no acute or suspicious osseous abnormalities. Review of the MIP images confirms the above findings. IMPRESSION: 1. No pulmonary embolus. 2. No acute intrathoracic abnormality. Aortic Atherosclerosis (ICD10-I70.0). Electronically Signed   By: Narda Rutherford M.D.   On: 11/24/2022 19:16   DG Chest 2 View  Result Date: 11/24/2022 CLINICAL DATA:  Chest pain EXAM: CHEST -  2 VIEW COMPARISON:  Chest x-ray dated November 20, 2022 FINDINGS: Cardiac and mediastinal contours within normal limits. Left chest wall biventricular pacer with unchanged lead positioning. Lungs are clear. No evidence of pleural effusion or pneumothorax. IMPRESSION: No active cardiopulmonary disease. Electronically Signed   By: Allegra Lai M.D.   On: 11/24/2022 14:57    Procedures Procedures    Medications Ordered in ED Medications  pantoprazole (PROTONIX) injection 40 mg (40 mg Intravenous Given 11/24/22 1814)  iohexol (OMNIPAQUE) 350 MG/ML injection 100 mL (75 mLs Intravenous Contrast Given 11/24/22 1845)  alum & mag hydroxide-simeth (MAALOX/MYLANTA) 200-200-20 MG/5ML suspension 30 mL (30 mLs Oral Given 11/24/22 1931)    ED Course/ Medical  Decision Making/ A&P                          Medical Decision Making Amount and/or Complexity of Data Reviewed Labs: ordered. Decision-making details documented in ED Course. Radiology: ordered. Decision-making details documented in ED Course.  Risk OTC drugs. Prescription drug management.    This patient presents to the ED for concern of CP, this involves an extensive number of treatment options, and is a complaint that carries with it a high risk of complications and morbidity.  I considered the following differential and admission for this acute, potentially life threatening condition.   MDM:    DDX for chest pain includes but is not limited to:  ACS/arrhythmia,  PE, aortic dissection, biliary disease, GERD/PUD/gastritis, or musculoskeletal pain. Very low suspicion for ACS vs aortic dissection given presenting sx. Patient cannot PERC out based on age, and given that this is her second presentation for chest pain, will obtain D dimer and reassess, minimal risk factors for PE. Patient's BP in either arm is not significantly different, no widened mediastinum on CXR, low c/f dissection. EKG w/o signs of pericarditis. No abdominal pain and no c/f biliary disease. Consider GERD, given that she improved w/ GI cocktail/pepcid the other day.   Clinical Course as of 11/24/22 1943  Wynelle Link Nov 24, 2022  1745 Troponin I (High Sensitivity): 3 Flat, 3-3 [HN]  1745 DG Chest 2 View No active cardiopulmonary disease. [HN]  1745 CBC, BMP unremarkable in context of presentation [HN]  1805 D-Dimer, Quant(!): 1.51 Elevated dimer - will get CT PE [HN]  1921 CT Angio Chest PE W and/or Wo Contrast Neg CT [HN]  1942 And patient improved after Protonix and GI cocktail.  Her workup is very reassuring including a negative CT PE.  Flat troponins.  Likely patient with GERD given that her symptoms occurred today after eating and she has not picked up the Pepcid yet.  I discussed with patient she needs to pick up  the Pepcid from the pharmacy and take it twice per day for 30 days.  She can follow-up with her primary care physician.  Given discharge instructions return precautions, all questions answered to patient's satisfaction. [HN]    Clinical Course User Index [HN] Loetta Rough, MD    Labs: I Ordered, and personally interpreted labs.  The pertinent results include: Those listed above  Imaging Studies ordered: I ordered imaging studies including chest x-ray, CT PE I independently visualized and interpreted imaging. I agree with the radiologist interpretation  Additional history obtained from son at bedside, chart review.   Cardiac Monitoring: The patient was maintained on a cardiac monitor.  I personally viewed and interpreted the cardiac monitored which showed an underlying rhythm of: Normal sinus rhythm  Reevaluation: After the interventions noted above, I reevaluated the patient and found that they have :resolved  Social Determinants of Health:  patient lives independently  Disposition: DC with discharge instructions and precautions  Co morbidities that complicate the patient evaluation  Past Medical History:  Diagnosis Date   Coronary artery disease    Hypertension      Medicines Meds ordered this encounter  Medications   pantoprazole (PROTONIX) injection 40 mg   iohexol (OMNIPAQUE) 350 MG/ML injection 100 mL   alum & mag hydroxide-simeth (MAALOX/MYLANTA) 200-200-20 MG/5ML suspension 30 mL    I have reviewed the patients home medicines and have made adjustments as needed  Problem List / ED Course: Problem List Items Addressed This Visit   None Visit Diagnoses     Gastroesophageal reflux disease, unspecified whether esophagitis present    -  Primary   Relevant Medications   pantoprazole (PROTONIX) injection 40 mg (Completed)   alum & mag hydroxide-simeth (MAALOX/MYLANTA) 200-200-20 MG/5ML suspension 30 mL (Completed)   Nonspecific chest pain                        This note was created using dictation software, which may contain spelling or grammatical errors.    Loetta Rough, MD 11/24/22 [phone]

## 2022-11-24 NOTE — Discharge Instructions (Signed)
Thank you for coming to [facility] Emergency Department. You were seen for chest pain. We did an exam, labs, and imaging, and these showed no acute findings.  Your symptoms improved again with reflux treatment.  Likely this is because of the reflux.  Please take Pepcid 20 mg twice per day as prescribed on 11/20/2022. Please follow up with your primary care provider within 1 week.   Do not hesitate to return to the ED or call 911 if you experience: -Worsening symptoms -Lightheadedness, passing out -Fevers/chills -Anything else that concerns you

## 2022-11-25 ENCOUNTER — Other Ambulatory Visit (HOSPITAL_COMMUNITY): Payer: Self-pay

## 2022-12-09 ENCOUNTER — Encounter: Payer: Medicare HMO | Admitting: Cardiology

## 2022-12-09 NOTE — Progress Notes (Signed)
Remote ICD transmission.   

## 2022-12-14 ENCOUNTER — Other Ambulatory Visit (HOSPITAL_COMMUNITY): Payer: Self-pay | Admitting: Cardiology

## 2022-12-14 ENCOUNTER — Other Ambulatory Visit (HOSPITAL_COMMUNITY): Payer: Self-pay

## 2022-12-16 ENCOUNTER — Other Ambulatory Visit: Payer: Self-pay

## 2022-12-16 ENCOUNTER — Other Ambulatory Visit (HOSPITAL_COMMUNITY): Payer: Self-pay

## 2022-12-16 MED ORDER — FUROSEMIDE 20 MG PO TABS
20.0000 mg | ORAL_TABLET | ORAL | 0 refills | Status: DC | PRN
  Filled 2022-12-16: qty 90, 90d supply, fill #0

## 2022-12-19 ENCOUNTER — Other Ambulatory Visit: Payer: Self-pay

## 2022-12-23 NOTE — Progress Notes (Signed)
  Electrophysiology Office Note:   ID:  Cereniti, Detemple April 24, 1946, MRN [id number]  Primary Cardiologist: None Electrophysiologist: Will Jorja Loa, MD  {Click to update primary MD,subspecialty MD or APP then REFRESH:1}    History of Present Illness:   Ann Bernard is a 77 y.o. female with h/o HTN, HLD, tobacco abuse, chronic systolic HF, and non obstructive CAD seen today for routine electrophysiology followup.    Last seen 10/2020.  Recently seen in ED 73 and 7/7 for subs-sternal chest pain and "tightness" while at rest. HS troponins negative both times, improved with GI cocktal and pepcid.   Since last being seen in our clinic the patient reports doing ***.  she denies chest pain, palpitations, dyspnea, PND, orthopnea, nausea, vomiting, dizziness, syncope, edema, weight gain, or early satiety.   Review of systems complete and found to be negative unless listed in HPI.   EP Information / Studies Reviewed:    {EKGtoday:28818}       ICD Interrogation-  reviewed in detail today,  See PACEART report.  Device History: Abbott BiV ICD implanted 08/2019 for NICM, CHF  Risk Assessment/Calculations:   {Does this patient have ATRIAL FIBRILLATION?[phone]} No BP recorded.  {Refresh Note OR Click here to enter BP  :1}***        Physical Exam:   VS:  There were no vitals taken for this visit.   Wt Readings from Last 3 Encounters:  11/24/22 165 lb 5.5 oz (75 kg)  11/20/22 167 lb (75.8 kg)  06/07/22 161 lb 12.8 oz (73.4 kg)     GEN: Well nourished, well developed in no acute distress NECK: No JVD; No carotid bruits CARDIAC: {EPRHYTHM:28826}, no murmurs, rubs, gallops RESPIRATORY:  Clear to auscultation without rales, wheezing or rhonchi  ABDOMEN: Soft, non-tender, non-distended EXTREMITIES:  No edema; No deformity   ASSESSMENT AND PLAN:    Chronic systolic dysfunction s/p Abbott CRT-D  euvolemic today Stable on an appropriate medical regimen Normal ICD function See  Pace Art report No changes today  Atypical chest pain Seen in ED 7/3 and 7/7 for this.  Improved with GI cocktail and Pepcid, initially didn't pick up Pepcid.  EF 50% 11/2021  Disposition:   Follow up with {EPPROVIDERS:28135} {EPFOLLOW UP:28173}   Signed, Graciella Freer, PA-C

## 2022-12-24 ENCOUNTER — Ambulatory Visit: Payer: Medicare PPO | Attending: Cardiology | Admitting: Student

## 2022-12-24 ENCOUNTER — Encounter: Payer: Self-pay | Admitting: Student

## 2022-12-24 ENCOUNTER — Other Ambulatory Visit (HOSPITAL_COMMUNITY): Payer: Self-pay

## 2022-12-24 ENCOUNTER — Other Ambulatory Visit: Payer: Self-pay

## 2022-12-24 VITALS — BP 124/66 | HR 66 | Ht 67.0 in | Wt 155.6 lb

## 2022-12-24 DIAGNOSIS — I10 Essential (primary) hypertension: Secondary | ICD-10-CM

## 2022-12-24 DIAGNOSIS — R0789 Other chest pain: Secondary | ICD-10-CM

## 2022-12-24 DIAGNOSIS — I428 Other cardiomyopathies: Secondary | ICD-10-CM | POA: Diagnosis not present

## 2022-12-24 DIAGNOSIS — I5022 Chronic systolic (congestive) heart failure: Secondary | ICD-10-CM

## 2022-12-24 LAB — CUP PACEART INCLINIC DEVICE CHECK
Battery Remaining Longevity: 45 mo
Brady Statistic RA Percent Paced: 13 %
Brady Statistic RV Percent Paced: 97 %
Date Time Interrogation Session: 20240806085353
HighPow Impedance: 69.75 Ohm
Implantable Lead Connection Status: 753985
Implantable Lead Connection Status: 753985
Implantable Lead Connection Status: 753985
Implantable Lead Implant Date: 20210409
Implantable Lead Implant Date: 20210409
Implantable Lead Implant Date: 20210409
Implantable Lead Location: 753858
Implantable Lead Location: 753859
Implantable Lead Location: 753860
Implantable Pulse Generator Implant Date: 20210409
Lead Channel Impedance Value: 462.5 Ohm
Lead Channel Impedance Value: 525 Ohm
Lead Channel Impedance Value: 725 Ohm
Lead Channel Pacing Threshold Amplitude: 0.625 V
Lead Channel Pacing Threshold Amplitude: 0.75 V
Lead Channel Pacing Threshold Amplitude: 0.75 V
Lead Channel Pacing Threshold Amplitude: 1.375 V
Lead Channel Pacing Threshold Pulse Width: 0.5 ms
Lead Channel Pacing Threshold Pulse Width: 0.5 ms
Lead Channel Pacing Threshold Pulse Width: 0.5 ms
Lead Channel Pacing Threshold Pulse Width: 1 ms
Lead Channel Sensing Intrinsic Amplitude: 11.9 mV
Lead Channel Sensing Intrinsic Amplitude: 3.3 mV
Lead Channel Setting Pacing Amplitude: 1.625
Lead Channel Setting Pacing Amplitude: 1.875
Lead Channel Setting Pacing Amplitude: 2 V
Lead Channel Setting Pacing Pulse Width: 0.5 ms
Lead Channel Setting Pacing Pulse Width: 1 ms
Lead Channel Setting Sensing Sensitivity: 0.5 mV
Pulse Gen Serial Number: 111006673
Zone Setting Status: 755011

## 2022-12-24 MED ORDER — SACUBITRIL-VALSARTAN 24-26 MG PO TABS
1.0000 | ORAL_TABLET | Freq: Two times a day (BID) | ORAL | 3 refills | Status: DC
Start: 2022-12-24 — End: 2024-02-02
  Filled 2022-12-24 – 2022-12-25 (×2): qty 60, 30d supply, fill #0

## 2022-12-24 NOTE — Patient Instructions (Addendum)
Medication Instructions:  1.Restart Entresto 24-26 mg twice daily *If you need a refill on your cardiac medications before your next appointment, please call your pharmacy*  Lab Work: BMET in 2 weeks If you have labs (blood work) drawn today and your tests are completely normal, you will receive your results only by: MyChart Message (if you have MyChart) OR A paper copy in the mail If you have any lab test that is abnormal or we need to change your treatment, we will call you to review the results.  Follow-Up: At [facility], you and your health needs are our priority.  As part of our continuing mission to provide you with exceptional heart care, we have created designated Provider Care Teams.  These Care Teams include your primary Cardiologist (physician) and Advanced Practice Providers (APPs -  Physician Assistants and Nurse Practitioners) who all work together to provide you with the care you need, when you need it.  We recommend signing up for the patient portal called "MyChart".  Sign up information is provided on this After Visit Summary.  MyChart is used to connect with patients for Virtual Visits (Telemedicine).  Patients are able to view lab/test results, encounter notes, upcoming appointments, etc.  Non-urgent messages can be sent to your provider as well.   To learn more about what you can do with MyChart, go to [email or web address].    Your next appointment:   6 month(s)  Provider:   Loman Brooklyn, MD   Schedule follow up appointment with Dr Shirlee Latch for next available

## 2022-12-25 ENCOUNTER — Other Ambulatory Visit (HOSPITAL_COMMUNITY): Payer: Self-pay

## 2022-12-26 ENCOUNTER — Other Ambulatory Visit: Payer: Self-pay

## 2022-12-30 ENCOUNTER — Other Ambulatory Visit (HOSPITAL_COMMUNITY): Payer: Self-pay

## 2022-12-30 ENCOUNTER — Other Ambulatory Visit: Payer: Self-pay

## 2023-01-06 ENCOUNTER — Ambulatory Visit: Payer: Medicare PPO | Attending: Student

## 2023-01-06 DIAGNOSIS — R0789 Other chest pain: Secondary | ICD-10-CM

## 2023-01-06 DIAGNOSIS — I428 Other cardiomyopathies: Secondary | ICD-10-CM

## 2023-01-06 DIAGNOSIS — I5022 Chronic systolic (congestive) heart failure: Secondary | ICD-10-CM

## 2023-01-06 DIAGNOSIS — I10 Essential (primary) hypertension: Secondary | ICD-10-CM

## 2023-01-07 LAB — BASIC METABOLIC PANEL
BUN/Creatinine Ratio: 15 (ref 12–28)
BUN: 13 mg/dL (ref 8–27)
CO2: 23 mmol/L (ref 20–29)
Calcium: 9.9 mg/dL (ref 8.7–10.3)
Chloride: 101 mmol/L (ref 96–106)
Creatinine, Ser: 0.89 mg/dL (ref 0.57–1.00)
Glucose: 93 mg/dL (ref 70–99)
Potassium: 4.3 mmol/L (ref 3.5–5.2)
Sodium: 139 mmol/L (ref 134–144)
eGFR: 67 mL/min/{1.73_m2} (ref >59)

## 2023-01-10 ENCOUNTER — Other Ambulatory Visit (HOSPITAL_COMMUNITY): Payer: Self-pay | Admitting: Cardiology

## 2023-01-10 ENCOUNTER — Other Ambulatory Visit: Payer: Self-pay

## 2023-01-10 ENCOUNTER — Other Ambulatory Visit (HOSPITAL_COMMUNITY): Payer: Self-pay

## 2023-01-10 MED ORDER — SPIRONOLACTONE 25 MG PO TABS
25.0000 mg | ORAL_TABLET | Freq: Every day | ORAL | 0 refills | Status: DC
  Filled 2023-01-10: qty 90, 90d supply, fill #0

## 2023-01-16 ENCOUNTER — Encounter (HOSPITAL_COMMUNITY): Payer: Self-pay

## 2023-01-16 ENCOUNTER — Ambulatory Visit (HOSPITAL_COMMUNITY)
Admission: RE | Admit: 2023-01-16 | Discharge: 2023-01-16 | Disposition: A | Discharge status: Home / Self Care | Payer: Medicare PPO | Source: Ambulatory Visit | Attending: Cardiology | Admitting: Cardiology

## 2023-01-16 VITALS — BP 110/72 | HR 63 | Wt 155.4 lb

## 2023-01-16 DIAGNOSIS — I428 Other cardiomyopathies: Secondary | ICD-10-CM | POA: Diagnosis not present

## 2023-01-16 DIAGNOSIS — I5022 Chronic systolic (congestive) heart failure: Secondary | ICD-10-CM | POA: Insufficient documentation

## 2023-01-16 DIAGNOSIS — Z79899 Other long term (current) drug therapy: Secondary | ICD-10-CM | POA: Insufficient documentation

## 2023-01-16 DIAGNOSIS — I447 Left bundle-branch block, unspecified: Secondary | ICD-10-CM | POA: Diagnosis not present

## 2023-01-16 DIAGNOSIS — I5032 Chronic diastolic (congestive) heart failure: Secondary | ICD-10-CM | POA: Diagnosis not present

## 2023-01-16 DIAGNOSIS — I2510 Atherosclerotic heart disease of native coronary artery without angina pectoris: Secondary | ICD-10-CM | POA: Insufficient documentation

## 2023-01-16 DIAGNOSIS — I110 Hypertensive heart disease with heart failure: Secondary | ICD-10-CM | POA: Insufficient documentation

## 2023-01-16 NOTE — Patient Instructions (Signed)
No Labs done today.   No medication changes were made. Please continue all current medications as prescribed.  Your physician recommends that you schedule a follow-up appointment in: 6 months. Please contact our office in December to schedule a February 2025 appointment.   If you have any questions or concerns before your next appointment please send [place] a message through [place] or call our office at [phone].    TO LEAVE A MESSAGE FOR THE NURSE SELECT OPTION 2, PLEASE LEAVE A MESSAGE INCLUDING: YOUR NAME DATE OF BIRTH CALL BACK NUMBER REASON FOR CALL**this is important as we prioritize the call backs  YOU WILL RECEIVE A CALL BACK THE SAME DAY AS LONG AS YOU CALL BEFORE 4:00 PM   Do the following things EVERYDAY: Weigh yourself in the morning before breakfast. Write it down and keep it in a log. Take your medicines as prescribed Eat low salt foods--Limit salt (sodium) to 2000 mg per day.  Stay as active as you can everyday Limit all fluids for the day to less than 2 liters   At the [facility], you and your health needs are our priority. As part of our continuing mission to provide you with exceptional heart care, we have created designated Provider Care Teams. These Care Teams include your primary Cardiologist (physician) and Advanced Practice Providers (APPs- Physician Assistants and Nurse Practitioners) who all work together to provide you with the care you need, when you need it.   You may see any of the following providers on your designated Care Team at your next follow up: Dr Arvilla Meres Dr Marca Ancona Dr. Marcos Eke, NP Robbie Lis, [place] [facility] [place], [place] Brynda Peon, NP Karle Plumber, PharmD   Please be sure to bring in all your medications bottles to every appointment.    Thank you for choosing [facility]

## 2023-01-16 NOTE — Addendum Note (Signed)
Encounter addended by: Allayne Butcher, PA-C on: 01/16/2023 1:15 PM  Actions taken: Clinical Note Signed

## 2023-01-16 NOTE — Progress Notes (Addendum)
PCP: Shellia Cleverly, PA HF Cardiology: Dr. Shirlee Latch  Ann Bernard is a 77 y.o. with history of HTN, hyperlipidemia, tobacco abuse, and chronic systolic heart failure. Of note, her brother has heart failure and has an ICD.    Admitted to [facility] in 10/20 with acute systolic heart failure. CTA negative for PE. ECHO was completed and showed severely reduced EF. Diagnostic cath was performed as noted below, mild coronary disease not likely to have caused cardiomyopathy.  Diuresed with IV lasix and transitioned to lasix 20 mg daily. Started on digoxin, carvedilol, and Entresto. Discharge weight 174 pounds.      Echo in 2/21 showed EF 20-25% with septal-lateral dyssynchrony.  Patient had St Jude CRT-D device placed.   Echo in 7/21 showed EF up to 50% with mild LVH, normal RV.  Echo (7/23) showed EF 50%, RV normal, mild MR.   Echo in 7/23 with stable EF 50%.  Seen recently in the ED x 2 on 7/3 and again on 7/7, both times for atypical chest pain. W/u unremarkable. Hs trops negative. D-dimer elevated on 7/7 however chest CT was negative for PNA. Etiology felt to be GI. Suspected GERD. Was given Rx for Pepcid.   She returns for 6 month followup of CHF. Here w/ son. Doing better. CP resolved. In review of symptoms, agree that CP likely 2/2 GERD. Described as postprandial mid sternal chest tightness. Resolved since starting Pepcid. No exertional CP or dyspnea. No orthopnea/PND. No LEE. BP well controlled. Compliant w/ meds.   Device interrogation: CorVue impedence ok. 98% BiV pacing. 1 run of NSVT on 8/15, self terminated. No device therapies.    Labs (2/21): digoxin 0.5, K 3.8, creatinine 0.78 Labs (4/21): LDL 64, K 3.6, creatinine 6.43 Labs (7/22): K 3.6, creatinine 0.98 Labs (1/24): K 3.2, creatinine 0.86 Labs (7/24): K 3.8, creatinine 1.01 Labs (8/24): K 4.3, creatinine 0.89   ECG: not performed   PMH: 1. Chronic systolic CHF: Nonischemic cardiomyopathy.  - Echo (10/20): EF < 20%, moderately  dilated LV, severe global HK.  - RHC/LHC (10/20): D1 and D2 with 50% ostial stenosis.  PCWP 23, CI 2.34.  - CMRI (10/20): Moderately dilated LV with EF 13%, diffuse hypokinesis, septal-lateral dyssynchrony, normal RV size with EF 30%, nonspecific RV insertion site LGE.   - Echo (2/21): EF 20-25%, septal-lateral dyssynchrony, mild AI, mild-moderately decreased RV systolic function.  - St Jude CRT-D device placed.  - Echo (7/21): EF 50%, mild LVH, normal RV - Echo (7/23): EF 50%, RV normal, mild MR 2. Active smoker.  3. Hyperlipidemia 4. HTN 5. ABIs (2/21): Normal.   ROS: All systems negative except as listed in HPI, PMH and Problem List.  SH:  Social History   Socioeconomic History   Marital status: Widowed    Spouse name: Not on file   Number of children: Not on file   Years of education: Not on file   Highest education level: Not on file  Occupational History   Not on file  Tobacco Use   Smoking status: Former    Types: Cigarettes   Smokeless tobacco: Never  Vaping Use   Vaping status: Never Used  Substance and Sexual Activity   Alcohol use: No   Drug use: Never   Sexual activity: Not Currently  Other Topics Concern   Not on file  Social History Narrative   Not on file   Social Determinants of Health   Financial Resource Strain: Low Risk  (08/05/2019)   Overall  Financial Resource Strain (CARDIA)    Difficulty of Paying Living Expenses: Not very hard  Food Insecurity: No Food Insecurity (08/05/2019)   Hunger Vital Sign    Worried About Running Out of Food in the Last Year: Never true    Ran Out of Food in the Last Year: Never true  Transportation Needs: No Transportation Needs (08/05/2019)   PRAPARE - Administrator, Civil Service (Medical): No    Lack of Transportation (Non-Medical): No  Physical Activity: Unknown (03/15/2019)   Exercise Vital Sign    Days of Exercise per Week: 0 days    Minutes of Exercise per Session: Not on file  Stress: No Stress  Concern Present (03/15/2019)   [employer] of Occupational Health - Occupational Stress Questionnaire    Feeling of Stress : Not at all  Social Connections: Moderately Isolated (03/15/2019)   Social Connection and Isolation Panel [NHANES]    Frequency of Communication with Friends and Family: More than three times a week    Frequency of Social Gatherings with Friends and Family: Once a week    Attends Religious Services: More than 4 times per year    Active Member of [employer] or Organizations: No    Attends Banker Meetings: Never    Marital Status: Widowed  Intimate Partner Violence: Not At Risk (03/15/2019)   Humiliation, Afraid, Rape, and Kick questionnaire    Fear of Current or Ex-Partner: No    Emotionally Abused: No    Physically Abused: No    Sexually Abused: No    FH:  Family History  Problem Relation Age of Onset   Hypertension Mother    Diabetes Mother    Diabetes Brother    Alcoholism Father    Diabetes type I Grandson     Current Outpatient Medications  Medication Sig Dispense Refill   alendronate (FOSAMAX) 70 MG tablet Take 70 mg by mouth once a week. Every Monday     atorvastatin (LIPITOR) 40 MG tablet TAKE 1 TABLET BY MOUTH ONCE A DAY AT 6PM 90 tablet 3   carvedilol (COREG) 12.5 MG tablet Take 1.5 tablets (18.75 mg total) by mouth 2 (two) times daily. (dose increase) 60 tablet 11   cyclobenzaprine (FLEXERIL) 10 MG tablet Take 1 tablet (10 mg total) by mouth 2 (two) times daily as needed for muscle spasms. 20 tablet 0   dapagliflozin propanediol (FARXIGA) 10 MG TABS tablet Take 1 tablet (10 mg total) by mouth daily before breakfast. 90 tablet 3   diphenhydrAMINE (SOMINEX) 25 MG tablet Take 1 tablet by mouth daily as needed for itching.     famotidine (PEPCID) 20 MG tablet Take 20 mg by mouth 2 (two) times daily.     furosemide (LASIX) 20 MG tablet Take 20 mg by mouth daily as needed.     hydrALAZINE (APRESOLINE) 50 MG tablet Take 1 tablet (50 mg  total) by mouth in the morning and at bedtime. 180 tablet 0   Multiple Vitamins-Minerals (PRESERVISION AREDS 2 PO) Take 1 capsule by mouth daily.     potassium chloride SA (KLOR-CON M) 20 MEQ tablet Take 1 tablet (20 mEq total) by mouth daily. 60 tablet 3   sacubitril-valsartan (ENTRESTO) 24-26 MG Take 1 tablet by mouth 2 (two) times daily. 60 tablet 3   spironolactone (ALDACTONE) 25 MG tablet Take 1 tablet (25 mg total) by mouth daily. 90 tablet 0   No current facility-administered medications for this encounter.    Vitals:  01/16/23 1057  BP: 110/72  Pulse: 63  SpO2: 98%  Weight: 70.5 kg (155 lb 6.4 oz)    Wt Readings from Last 3 Encounters:  01/16/23 70.5 kg (155 lb 6.4 oz)  12/24/22 70.6 kg (155 lb 9.6 oz)  11/24/22 75 kg (165 lb 5.5 oz)   PHYSICAL EXAM: General:  Well appearing. No respiratory difficulty HEENT: normal Neck: supple. no JVD. Carotids 2+ bilat; no bruits. No lymphadenopathy or thyromegaly appreciated. Cor: PMI nondisplaced. Regular rate & rhythm. No rubs, gallops or murmurs. Lungs: clear Abdomen: soft, nontender, nondistended. No hepatosplenomegaly. No bruits or masses. Good bowel sounds. Extremities: no cyanosis, clubbing, rash, edema Neuro: alert & oriented x 3, cranial nerves grossly intact. moves all 4 extremities w/o difficulty. Affect pleasant.   ASSESSMENT & PLAN: 1. Chronic Systolic CHF: Nonischemic cardiomyopathy. Echo 10/20 with EF < 20%.  LHC/RHC 10/20 showed nonobstructive coronary disease, CI 2.3.  Cardiac MRI (10/20) showed EF 13%, moderate LV dilation, septal-lateral dyssynchrony c/w LBBB and diffuse severe hypokinesis, normal RV size with moderate systolic dysfunction EF 30%, mild-moderate MR, moderate AI, LGE at inferior RV insertion site (nonspecific). Echo in 2/21 showed that EF remained 20-25%. She has a wide LBBB => St Jude CRT-D device placed. 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  Echo in 7/21 showed improvement in  EF up to 50%, Echo in 7/23 with stable EF 50%.  NYHA class I-II symptoms. Euvolemic on exam and by CorVue.  - Continue Coreg 18.75 mg bid  - Continue Entresto 24-26 mg bid  - Continue spironolactone 25 mg daily.  - Continue Farxiga 10 mg daily. Denies GU symptoms  - Continue hydralazine 50 mg bid  - reviewed recent BMP from 8/19, SCr and K normal  - s/p CRT-D, followed by Dr. Elberta Fortis   2. CAD: Nonobstructive CAD.  - no ischemic CP  - Continue atorvastatin, recent LDL 73.    F/u Dr. Shirlee Latch in 6 months.   Robbie Lis, PA-C  01/16/2023

## 2023-01-21 ENCOUNTER — Other Ambulatory Visit (HOSPITAL_COMMUNITY): Payer: Self-pay

## 2023-01-21 ENCOUNTER — Other Ambulatory Visit (HOSPITAL_COMMUNITY): Payer: Self-pay | Admitting: Emergency Medicine

## 2023-01-23 ENCOUNTER — Other Ambulatory Visit (HOSPITAL_COMMUNITY): Payer: Self-pay

## 2023-01-30 ENCOUNTER — Encounter: Payer: Self-pay | Admitting: Cardiology

## 2023-02-21 ENCOUNTER — Ambulatory Visit (INDEPENDENT_AMBULATORY_CARE_PROVIDER_SITE_OTHER): Payer: Medicare PPO

## 2023-02-21 DIAGNOSIS — I428 Other cardiomyopathies: Secondary | ICD-10-CM

## 2023-02-21 LAB — CUP PACEART REMOTE DEVICE CHECK
Battery Remaining Longevity: 44 mo
Battery Remaining Percentage: 48 %
Battery Voltage: 2.95 V
Brady Statistic AP VP Percent: 12 %
Brady Statistic AP VS Percent: 1 %
Brady Statistic AS VP Percent: 86 %
Brady Statistic AS VS Percent: 1 %
Brady Statistic RA Percent Paced: 11 %
Date Time Interrogation Session: 20241004020534
HighPow Impedance: 69 Ohm
Implantable Lead Connection Status: 753985
Implantable Lead Connection Status: 753985
Implantable Lead Connection Status: 753985
Implantable Lead Implant Date: 20210409
Implantable Lead Implant Date: 20210409
Implantable Lead Implant Date: 20210409
Implantable Lead Location: 753858
Implantable Lead Location: 753859
Implantable Lead Location: 753860
Implantable Pulse Generator Implant Date: 20210409
Lead Channel Impedance Value: 440 Ohm
Lead Channel Impedance Value: 450 Ohm
Lead Channel Impedance Value: 740 Ohm
Lead Channel Pacing Threshold Amplitude: 0.625 V
Lead Channel Pacing Threshold Amplitude: 0.75 V
Lead Channel Pacing Threshold Amplitude: 1.75 V
Lead Channel Pacing Threshold Pulse Width: 0.5 ms
Lead Channel Pacing Threshold Pulse Width: 0.5 ms
Lead Channel Pacing Threshold Pulse Width: 1 ms
Lead Channel Sensing Intrinsic Amplitude: 11.9 mV
Lead Channel Sensing Intrinsic Amplitude: 2.6 mV
Lead Channel Setting Pacing Amplitude: 1.625
Lead Channel Setting Pacing Amplitude: 2 V
Lead Channel Setting Pacing Amplitude: 2.25 V
Lead Channel Setting Pacing Pulse Width: 0.5 ms
Lead Channel Setting Pacing Pulse Width: 1 ms
Lead Channel Setting Sensing Sensitivity: 0.5 mV
Pulse Gen Serial Number: 111006673
Zone Setting Status: 755011

## 2023-03-04 NOTE — Progress Notes (Signed)
Remote ICD transmission.   

## 2023-03-07 ENCOUNTER — Other Ambulatory Visit (HOSPITAL_COMMUNITY): Payer: Self-pay

## 2023-03-10 ENCOUNTER — Other Ambulatory Visit (HOSPITAL_COMMUNITY): Payer: Self-pay | Admitting: Cardiology

## 2023-03-10 ENCOUNTER — Other Ambulatory Visit (HOSPITAL_COMMUNITY): Payer: Self-pay

## 2023-03-11 ENCOUNTER — Other Ambulatory Visit (HOSPITAL_COMMUNITY): Payer: Self-pay

## 2023-03-11 MED ORDER — POTASSIUM CHLORIDE CRYS ER 20 MEQ PO TBCR
20.0000 meq | EXTENDED_RELEASE_TABLET | Freq: Every day | ORAL | 3 refills | Status: DC
  Filled 2023-03-11 – 2023-03-21 (×4): qty 60, 60d supply, fill #0
  Filled 2023-05-12: qty 60, 60d supply, fill #1
  Filled 2023-07-04 – 2023-07-05 (×2): qty 60, 60d supply, fill #2
  Filled 2023-10-23: qty 60, 60d supply, fill #3

## 2023-03-13 ENCOUNTER — Other Ambulatory Visit (HOSPITAL_COMMUNITY): Payer: Self-pay

## 2023-03-17 ENCOUNTER — Other Ambulatory Visit: Payer: Self-pay

## 2023-03-17 ENCOUNTER — Other Ambulatory Visit (HOSPITAL_COMMUNITY): Payer: Self-pay

## 2023-03-19 ENCOUNTER — Other Ambulatory Visit (HOSPITAL_COMMUNITY): Payer: Self-pay

## 2023-03-21 ENCOUNTER — Other Ambulatory Visit (HOSPITAL_COMMUNITY): Payer: Self-pay

## 2023-03-31 ENCOUNTER — Other Ambulatory Visit (HOSPITAL_COMMUNITY): Payer: Self-pay

## 2023-03-31 ENCOUNTER — Other Ambulatory Visit (HOSPITAL_COMMUNITY): Payer: Self-pay | Admitting: Cardiology

## 2023-03-31 ENCOUNTER — Other Ambulatory Visit: Payer: Self-pay | Admitting: Cardiology

## 2023-03-31 MED ORDER — ATORVASTATIN CALCIUM 40 MG PO TABS
40.0000 mg | ORAL_TABLET | Freq: Every day | ORAL | 3 refills | Status: DC
  Filled 2023-03-31: qty 90, 90d supply, fill #0
  Filled 2023-04-30 – 2023-05-05 (×3): qty 90, 90d supply, fill #1
  Filled 2023-10-23: qty 90, 90d supply, fill #2

## 2023-03-31 MED ORDER — HYDRALAZINE HCL 50 MG PO TABS
50.0000 mg | ORAL_TABLET | Freq: Two times a day (BID) | ORAL | 0 refills | Status: DC
  Filled 2023-03-31: qty 180, 90d supply, fill #0

## 2023-04-03 ENCOUNTER — Other Ambulatory Visit (HOSPITAL_COMMUNITY): Payer: Self-pay

## 2023-04-14 ENCOUNTER — Other Ambulatory Visit (HOSPITAL_COMMUNITY): Payer: Self-pay | Admitting: Cardiology

## 2023-04-14 ENCOUNTER — Other Ambulatory Visit (HOSPITAL_COMMUNITY): Payer: Self-pay

## 2023-04-14 MED ORDER — SPIRONOLACTONE 25 MG PO TABS
25.0000 mg | ORAL_TABLET | Freq: Every day | ORAL | 1 refills | Status: DC
  Filled 2023-04-14: qty 90, 90d supply, fill #0
  Filled 2023-06-16 – 2023-06-20 (×2): qty 90, 90d supply, fill #1

## 2023-04-15 ENCOUNTER — Other Ambulatory Visit (HOSPITAL_COMMUNITY): Payer: Self-pay

## 2023-04-30 ENCOUNTER — Other Ambulatory Visit: Payer: Self-pay | Admitting: Cardiology

## 2023-05-01 ENCOUNTER — Other Ambulatory Visit (HOSPITAL_COMMUNITY): Payer: Self-pay

## 2023-05-02 ENCOUNTER — Other Ambulatory Visit: Payer: Self-pay

## 2023-05-02 ENCOUNTER — Other Ambulatory Visit (HOSPITAL_COMMUNITY): Payer: Self-pay

## 2023-05-02 MED ORDER — HYDRALAZINE HCL 50 MG PO TABS
50.0000 mg | ORAL_TABLET | Freq: Two times a day (BID) | ORAL | 0 refills | Status: DC
  Filled 2023-05-02 – 2023-05-05 (×3): qty 180, 90d supply, fill #0

## 2023-05-05 ENCOUNTER — Other Ambulatory Visit (HOSPITAL_COMMUNITY): Payer: Self-pay

## 2023-05-12 ENCOUNTER — Other Ambulatory Visit (HOSPITAL_COMMUNITY): Payer: Self-pay

## 2023-05-23 ENCOUNTER — Ambulatory Visit (INDEPENDENT_AMBULATORY_CARE_PROVIDER_SITE_OTHER): Payer: Medicare PPO

## 2023-05-23 DIAGNOSIS — I428 Other cardiomyopathies: Secondary | ICD-10-CM | POA: Diagnosis not present

## 2023-05-23 LAB — CUP PACEART REMOTE DEVICE CHECK
Battery Remaining Longevity: 41 mo
Battery Remaining Percentage: 45 %
Battery Voltage: 2.95 V
Brady Statistic AP VP Percent: 13 %
Brady Statistic AP VS Percent: 1 %
Brady Statistic AS VP Percent: 86 %
Brady Statistic AS VS Percent: 1 %
Brady Statistic RA Percent Paced: 12 %
Date Time Interrogation Session: 20250103010400
HighPow Impedance: 68 Ohm
Implantable Lead Connection Status: 753985
Implantable Lead Connection Status: 753985
Implantable Lead Connection Status: 753985
Implantable Lead Implant Date: 20210409
Implantable Lead Implant Date: 20210409
Implantable Lead Implant Date: 20210409
Implantable Lead Location: 753858
Implantable Lead Location: 753859
Implantable Lead Location: 753860
Implantable Pulse Generator Implant Date: 20210409
Lead Channel Impedance Value: 440 Ohm
Lead Channel Impedance Value: 530 Ohm
Lead Channel Impedance Value: 740 Ohm
Lead Channel Pacing Threshold Amplitude: 0.625 V
Lead Channel Pacing Threshold Amplitude: 0.75 V
Lead Channel Pacing Threshold Amplitude: 1.375 V
Lead Channel Pacing Threshold Pulse Width: 0.5 ms
Lead Channel Pacing Threshold Pulse Width: 0.5 ms
Lead Channel Pacing Threshold Pulse Width: 1 ms
Lead Channel Sensing Intrinsic Amplitude: 11.9 mV
Lead Channel Sensing Intrinsic Amplitude: 2.3 mV
Lead Channel Setting Pacing Amplitude: 1.625
Lead Channel Setting Pacing Amplitude: 1.875
Lead Channel Setting Pacing Amplitude: 2 V
Lead Channel Setting Pacing Pulse Width: 0.5 ms
Lead Channel Setting Pacing Pulse Width: 1 ms
Lead Channel Setting Sensing Sensitivity: 0.5 mV
Pulse Gen Serial Number: 111006673
Zone Setting Status: 755011

## 2023-06-04 ENCOUNTER — Emergency Department (HOSPITAL_BASED_OUTPATIENT_CLINIC_OR_DEPARTMENT_OTHER): Payer: Medicare HMO

## 2023-06-04 ENCOUNTER — Emergency Department (HOSPITAL_BASED_OUTPATIENT_CLINIC_OR_DEPARTMENT_OTHER)
Admission: EM | Admit: 2023-06-04 | Discharge: 2023-06-04 | Disposition: A | Discharge status: Home / Self Care | Payer: Medicare HMO | Attending: Emergency Medicine | Admitting: Emergency Medicine

## 2023-06-04 ENCOUNTER — Other Ambulatory Visit: Payer: Self-pay

## 2023-06-04 ENCOUNTER — Encounter (HOSPITAL_BASED_OUTPATIENT_CLINIC_OR_DEPARTMENT_OTHER): Payer: Self-pay | Admitting: Emergency Medicine

## 2023-06-04 DIAGNOSIS — I509 Heart failure, unspecified: Secondary | ICD-10-CM | POA: Insufficient documentation

## 2023-06-04 DIAGNOSIS — M79604 Pain in right leg: Secondary | ICD-10-CM | POA: Insufficient documentation

## 2023-06-04 DIAGNOSIS — Z950 Presence of cardiac pacemaker: Secondary | ICD-10-CM | POA: Insufficient documentation

## 2023-06-04 DIAGNOSIS — R0789 Other chest pain: Secondary | ICD-10-CM | POA: Diagnosis not present

## 2023-06-04 DIAGNOSIS — I110 Hypertensive heart disease with heart failure: Secondary | ICD-10-CM | POA: Diagnosis not present

## 2023-06-04 DIAGNOSIS — Z79899 Other long term (current) drug therapy: Secondary | ICD-10-CM | POA: Insufficient documentation

## 2023-06-04 DIAGNOSIS — I2510 Atherosclerotic heart disease of native coronary artery without angina pectoris: Secondary | ICD-10-CM | POA: Diagnosis not present

## 2023-06-04 DIAGNOSIS — R079 Chest pain, unspecified: Secondary | ICD-10-CM | POA: Diagnosis present

## 2023-06-04 LAB — HEPATIC FUNCTION PANEL
ALT: 17 U/L (ref 0–44)
AST: 24 U/L (ref 15–41)
Albumin: 3.8 g/dL (ref 3.5–5.0)
Alkaline Phosphatase: 77 U/L (ref 38–126)
Bilirubin, Direct: 0.2 mg/dL (ref 0.0–0.2)
Indirect Bilirubin: 0.7 mg/dL (ref 0.3–0.9)
Total Bilirubin: 0.9 mg/dL (ref 0.0–1.2)
Total Protein: 7.3 g/dL (ref 6.5–8.1)

## 2023-06-04 LAB — CBC WITH DIFFERENTIAL/PLATELET
Abs Immature Granulocytes: 0.03 10*3/uL (ref 0.00–0.07)
Basophils Absolute: 0 10*3/uL (ref 0.0–0.1)
Basophils Relative: 1 %
Eosinophils Absolute: 0.1 10*3/uL (ref 0.0–0.5)
Eosinophils Relative: 1 %
HCT: 42.1 % (ref 36.0–46.0)
Hemoglobin: 14.2 g/dL (ref 12.0–15.0)
Immature Granulocytes: 1 %
Lymphocytes Relative: 23 %
Lymphs Abs: 1.3 10*3/uL (ref 0.7–4.0)
MCH: 33.1 pg (ref 26.0–34.0)
MCHC: 33.7 g/dL (ref 30.0–36.0)
MCV: 98.1 fL (ref 80.0–100.0)
Monocytes Absolute: 0.5 10*3/uL (ref 0.1–1.0)
Monocytes Relative: 10 %
Neutro Abs: 3.6 10*3/uL (ref 1.7–7.7)
Neutrophils Relative %: 64 %
Platelets: 173 10*3/uL (ref 150–400)
RBC: 4.29 MIL/uL (ref 3.87–5.11)
RDW: 13.1 % (ref 11.5–15.5)
WBC: 5.6 10*3/uL (ref 4.0–10.5)
nRBC: 0 % (ref 0.0–0.2)

## 2023-06-04 LAB — TROPONIN I (HIGH SENSITIVITY)
Troponin I (High Sensitivity): 3 ng/L (ref <18)
Troponin I (High Sensitivity): 3 ng/L (ref <18)

## 2023-06-04 LAB — BASIC METABOLIC PANEL
Anion gap: 9 (ref 5–15)
BUN: 15 mg/dL (ref 8–23)
CO2: 21 mmol/L — ABNORMAL LOW (ref 22–32)
Calcium: 9.2 mg/dL (ref 8.9–10.3)
Chloride: 104 mmol/L (ref 98–111)
Creatinine, Ser: 0.71 mg/dL (ref 0.44–1.00)
GFR, Estimated: >60 mL/min (ref >60)
Glucose, Bld: 119 mg/dL — ABNORMAL HIGH (ref 70–99)
Potassium: 3.8 mmol/L (ref 3.5–5.1)
Sodium: 134 mmol/L — ABNORMAL LOW (ref 135–145)

## 2023-06-04 LAB — D-DIMER, QUANTITATIVE: D-Dimer, Quant: 1.61 ug{FEU}/mL — ABNORMAL HIGH (ref 0.00–0.50)

## 2023-06-04 LAB — LIPASE, BLOOD: Lipase: 45 U/L (ref 11–51)

## 2023-06-04 MED ORDER — ALUM & MAG HYDROXIDE-SIMETH 200-200-20 MG/5ML PO SUSP
30.0000 mL | Freq: Once | ORAL | Status: AC
  Administered 2023-06-04: 30 mL via ORAL
  Filled 2023-06-04: qty 30

## 2023-06-04 MED ORDER — FAMOTIDINE IN NACL 20-0.9 MG/50ML-% IV SOLN
20.0000 mg | Freq: Once | INTRAVENOUS | Status: AC
  Administered 2023-06-04: 20 mg via INTRAVENOUS
  Filled 2023-06-04: qty 50

## 2023-06-04 NOTE — ED Triage Notes (Signed)
 Pt with c/o middle CP, constant, since this morning; denies other sxs

## 2023-06-04 NOTE — ED Provider Notes (Signed)
 [place] [facility] Provider Note   CSN: [id number] Arrival date & time: 06/04/23  1348     History  Chief Complaint  Patient presents with   Chest Pain    Ann Bernard is a 78 y.o. female.  Pt is a 78 yo female with pmhx significant for htn, cad, gerd, nonischemic cardiomyopathy and chf s/p pacemaker placement.  Pt developed cp this morning after eating breakfast.  She did not take anything for it.  Pain is in the middle of her chest.  It is a cramping sensation.  No other associated sx.  Pt also reports some right leg pain.       Home Medications Prior to Admission medications   Medication Sig Start Date End Date Taking? Authorizing Provider  alendronate (FOSAMAX) 70 MG tablet Take 70 mg by mouth once a week. Every Monday 04/26/19   [provider]  atorvastatin  (LIPITOR) 40 MG tablet TAKE 1 TABLET BY MOUTH ONCE A DAY AT [facility] 03/31/23 03/30/24  Darlis Eisenmenger, MD  carvedilol  (COREG ) 12.5 MG tablet Take 1.5 tablets (18.75 mg total) by mouth 2 (two) times daily. (dose increase) 06/07/22   Darlis Eisenmenger, MD  cyclobenzaprine  (FLEXERIL ) 10 MG tablet Take 1 tablet (10 mg total) by mouth 2 (two) times daily as needed for muscle spasms. 05/31/22   Scarlette Currier, MD  dapagliflozin  propanediol (FARXIGA ) 10 MG TABS tablet Take 1 tablet (10 mg total) by mouth daily before breakfast. 06/24/22   Darlis Eisenmenger, MD  diphenhydrAMINE (SOMINEX) 25 MG tablet Take 1 tablet by mouth daily as needed for itching.    [provider]  famotidine  (PEPCID ) 20 MG tablet Take 20 mg by mouth 2 (two) times daily.    [provider]  furosemide  (LASIX ) 20 MG tablet Take 20 mg by mouth daily as needed.    [provider]  hydrALAZINE  (APRESOLINE ) 50 MG tablet Take 1 tablet (50 mg total) by mouth in the morning and at bedtime. 05/02/23   Darlis Eisenmenger, MD  Multiple Vitamins-Minerals (PRESERVISION AREDS 2 PO) Take 1 capsule by mouth  daily.    [provider]  potassium chloride  SA (KLOR-CON  M) 20 MEQ tablet Take 1 tablet (20 mEq total) by mouth daily. 03/11/23   Darlis Eisenmenger, MD  sacubitril -valsartan  (ENTRESTO ) 24-26 MG Take 1 tablet by mouth 2 (two) times daily. 12/24/22   Tylene Galla, PA-C  spironolactone  (ALDACTONE ) 25 MG tablet Take 1 tablet (25 mg total) by mouth daily. 04/14/23   Horace Lye, PA-C      Allergies    Aspirin     Review of Systems   Review of Systems  Cardiovascular:  Positive for chest pain.  All other systems reviewed and are negative.   Physical Exam Updated Vital Signs BP 111/64   Pulse 66   Temp 98.3 F (36.8 C)   Resp 20   Ht 5\' 7"  (1.702 m)   Wt 66.7 kg   SpO2 100%   BMI 23.02 kg/m  Physical Exam Vitals and nursing note reviewed.  Constitutional:      Appearance: She is well-developed.  HENT:     Head: Normocephalic.  Eyes:     Extraocular Movements: Extraocular movements intact.     Pupils: Pupils are equal, round, and reactive to light.  Cardiovascular:     Rate and Rhythm: Normal rate and regular rhythm.     Heart sounds: Normal heart sounds.  Pulmonary:  Effort: Pulmonary effort is normal.     Breath sounds: Normal breath sounds.  Abdominal:     General: Bowel sounds are normal.     Palpations: Abdomen is soft.  Musculoskeletal:        General: Normal range of motion.     Cervical back: Normal range of motion and neck supple.  Skin:    General: Skin is warm.     Capillary Refill: Capillary refill takes less than 2 seconds.  Neurological:     General: No focal deficit present.     Mental Status: She is alert and oriented to person, place, and time.  Psychiatric:        Mood and Affect: Mood normal.        Behavior: Behavior normal.     ED Results / Procedures / Treatments   Labs (all labs ordered are listed, but only abnormal results are displayed) Labs Reviewed  BASIC METABOLIC PANEL - Abnormal; Notable for the  following components:      Result Value   Sodium 134 (*)    CO2 21 (*)    Glucose, Bld 119 (*)    All other components within normal limits  D-DIMER, QUANTITATIVE - Abnormal; Notable for the following components:   D-Dimer, Quant 1.61 (*)    All other components within normal limits  CBC WITH DIFFERENTIAL/PLATELET  HEPATIC FUNCTION PANEL  LIPASE, BLOOD  TROPONIN I (HIGH SENSITIVITY)  TROPONIN I (HIGH SENSITIVITY)    EKG EKG Interpretation Date/Time:  Wednesday June 04 2023 14:03:50 EST Ventricular Rate:  68 PR Interval:  144 QRS Duration:  126 QT Interval:  402 QTC Calculation: 427 R Axis:   189  Text Interpretation: Atrial-sensed ventricular-paced rhythm with frequent AV dual-paced complexes and with occasional Premature ventricular complexes Biventricular pacemaker detected Abnormal ECG When compared with ECG of 24-Nov-2022 14:29, PREVIOUS ECG IS PRESENT No significant change since last tracing Confirmed by Sueellen Emery [phone]) on 06/04/2023 4:10:40 PM  Radiology US  Venous Img Lower Bilateral (DVT) Result Date: 06/04/2023 CLINICAL DATA:  Right leg pain, elevated D-dimer and chest pain. EXAM: BILATERAL LOWER EXTREMITY VENOUS DOPPLER ULTRASOUND TECHNIQUE: Gray-scale sonography with graded compression, as well as color Doppler and duplex ultrasound were performed to evaluate the lower extremity deep venous systems from the level of the common femoral vein and including the common femoral, femoral, profunda femoral, popliteal and calf veins including the posterior tibial, peroneal and gastrocnemius veins when visible. The superficial great saphenous vein was also interrogated. Spectral Doppler was utilized to evaluate flow at rest and with distal augmentation maneuvers in the common femoral, femoral and popliteal veins. COMPARISON:  None Available. FINDINGS: RIGHT LOWER EXTREMITY Common Femoral Vein: No evidence of thrombus. Normal compressibility, respiratory phasicity and response  to augmentation. Saphenofemoral Junction: No evidence of thrombus. Normal compressibility and flow on color Doppler imaging. Profunda Femoral Vein: No evidence of thrombus. Normal compressibility and flow on color Doppler imaging. Femoral Vein: No evidence of thrombus. Normal compressibility, respiratory phasicity and response to augmentation. Popliteal Vein: No evidence of thrombus. Normal compressibility, respiratory phasicity and response to augmentation. Calf Veins: No evidence of thrombus. Normal compressibility and flow on color Doppler imaging. Superficial Great Saphenous Vein: No evidence of thrombus. Normal compressibility. Venous Reflux:  None. Other Findings:  None. LEFT LOWER EXTREMITY Common Femoral Vein: No evidence of thrombus. Normal compressibility, respiratory phasicity and response to augmentation. Saphenofemoral Junction: No evidence of thrombus. Normal compressibility and flow on color Doppler imaging. Profunda Femoral Vein: No evidence  of thrombus. Normal compressibility and flow on color Doppler imaging. Femoral Vein: No evidence of thrombus. Normal compressibility, respiratory phasicity and response to augmentation. Popliteal Vein: No evidence of thrombus. Normal compressibility, respiratory phasicity and response to augmentation. Calf Veins: No evidence of thrombus. Normal compressibility and flow on color Doppler imaging. Superficial Great Saphenous Vein: No evidence of thrombus. Normal compressibility. Venous Reflux:  None. Other Findings:  None. IMPRESSION: No evidence of deep venous thrombosis in either lower extremity. Electronically Signed   By: Virgle Grime M.D.   On: 06/04/2023 20:42   DG Chest 2 View Result Date: 06/04/2023 CLINICAL DATA:  Chest pain. EXAM: CHEST - 2 VIEW COMPARISON:  11/24/2022. FINDINGS: Bilateral lung fields are clear. Bilateral costophrenic angles are clear. Normal cardio-mediastinal silhouette. There is a left sided 3-lead pacemaker. No acute osseous  abnormalities. The soft tissues are within normal limits. IMPRESSION: No active cardiopulmonary disease. Electronically Signed   By: Beula Brunswick M.D.   On: 06/04/2023 16:39    Procedures Procedures    Medications Ordered in ED Medications  alum & mag hydroxide-simeth (MAALOX/MYLANTA) 200-200-20 MG/5ML suspension 30 mL (30 mLs Oral Given 06/04/23 1626)  famotidine  (PEPCID ) IVPB 20 mg premix (0 mg Intravenous Stopped 06/04/23 1804)    ED Course/ Medical Decision Making/ A&P                                 Medical Decision Making Amount and/or Complexity of Data Reviewed Labs: ordered. Radiology: ordered.  Risk OTC drugs. Prescription drug management.   This patient presents to the ED for concern of cp, this involves an extensive number of treatment options, and is a complaint that carries with it a high risk of complications and morbidity.  The differential diagnosis includes cardiac, pulm, gi   Co morbidities that complicate the patient evaluation   htn, cad, gerd, nonischemic cardiomyopathy and chf s/p pacemaker placement   Additional history obtained:  Additional history obtained from epic chart review External records from outside source obtained and reviewed including family   Lab Tests:  I Ordered, and personally interpreted labs.  The pertinent results include:  cbc nl, bmp nl, trop nl   Imaging Studies ordered:  I ordered imaging studies including cxr and us  I independently visualized and interpreted imaging which showed  CXR: No active cardiopulmonary disease.  US : No evidence of deep venous thrombosis in either lower extremity.  I agree with the radiologist interpretation   Cardiac Monitoring:  The patient was maintained on a cardiac monitor.  I personally viewed and interpreted the cardiac monitored which showed an underlying rhythm of: nsr   Medicines ordered and prescription drug management:  I ordered medication including gi cocktail,  pepcid   for sx  Reevaluation of the patient after these medicines showed that the patient improved I have reviewed the patients home medicines and have made adjustments as needed   Test Considered:  us    Problem List / ED Course:  CP:  atypical.  Pt is feeling better after gi cocktail and pepcid .  Cardiac work up neg.  Pt is stable for d/c.  Return if worse.  F/u with pcp. R leg pain:  no swelling or redness.  US  neg.  Likely msk.   Reevaluation:  After the interventions noted above, I reevaluated the patient and found that they have :improved   Social Determinants of Health:  Lives at home   Dispostion:  After consideration  of the diagnostic results and the patients response to treatment, I feel that the patent would benefit from discharge with outpatient f/u.          Final Clinical Impression(s) / ED Diagnoses Final diagnoses:  Atypical chest pain    Rx / DC Orders ED Discharge Orders     None         Sueellen Emery, MD 06/04/23 2054

## 2023-06-06 ENCOUNTER — Other Ambulatory Visit: Payer: Self-pay

## 2023-06-06 ENCOUNTER — Encounter (HOSPITAL_BASED_OUTPATIENT_CLINIC_OR_DEPARTMENT_OTHER): Payer: Self-pay

## 2023-06-06 ENCOUNTER — Emergency Department (HOSPITAL_BASED_OUTPATIENT_CLINIC_OR_DEPARTMENT_OTHER)
Admission: EM | Admit: 2023-06-06 | Discharge: 2023-06-06 | Disposition: A | Discharge status: Home / Self Care | Payer: Medicare HMO | Attending: Emergency Medicine | Admitting: Emergency Medicine

## 2023-06-06 ENCOUNTER — Emergency Department (HOSPITAL_BASED_OUTPATIENT_CLINIC_OR_DEPARTMENT_OTHER): Payer: Medicare HMO

## 2023-06-06 DIAGNOSIS — I509 Heart failure, unspecified: Secondary | ICD-10-CM | POA: Diagnosis not present

## 2023-06-06 DIAGNOSIS — I2510 Atherosclerotic heart disease of native coronary artery without angina pectoris: Secondary | ICD-10-CM | POA: Insufficient documentation

## 2023-06-06 DIAGNOSIS — I110 Hypertensive heart disease with heart failure: Secondary | ICD-10-CM | POA: Diagnosis not present

## 2023-06-06 DIAGNOSIS — Z79899 Other long term (current) drug therapy: Secondary | ICD-10-CM | POA: Diagnosis not present

## 2023-06-06 DIAGNOSIS — R079 Chest pain, unspecified: Secondary | ICD-10-CM

## 2023-06-06 DIAGNOSIS — K219 Gastro-esophageal reflux disease without esophagitis: Secondary | ICD-10-CM | POA: Diagnosis not present

## 2023-06-06 DIAGNOSIS — R072 Precordial pain: Secondary | ICD-10-CM | POA: Insufficient documentation

## 2023-06-06 LAB — CBC
HCT: 43.8 % (ref 36.0–46.0)
Hemoglobin: 14.4 g/dL (ref 12.0–15.0)
MCH: 32.4 pg (ref 26.0–34.0)
MCHC: 32.9 g/dL (ref 30.0–36.0)
MCV: 98.4 fL (ref 80.0–100.0)
Platelets: 170 10*3/uL (ref 150–400)
RBC: 4.45 MIL/uL (ref 3.87–5.11)
RDW: 13 % (ref 11.5–15.5)
WBC: 6.1 10*3/uL (ref 4.0–10.5)
nRBC: 0 % (ref 0.0–0.2)

## 2023-06-06 LAB — BASIC METABOLIC PANEL
Anion gap: 7 (ref 5–15)
BUN: 15 mg/dL (ref 8–23)
CO2: 26 mmol/L (ref 22–32)
Calcium: 9.2 mg/dL (ref 8.9–10.3)
Chloride: 104 mmol/L (ref 98–111)
Creatinine, Ser: 0.91 mg/dL (ref 0.44–1.00)
GFR, Estimated: >60 mL/min (ref >60)
Glucose, Bld: 114 mg/dL — ABNORMAL HIGH (ref 70–99)
Potassium: 3.8 mmol/L (ref 3.5–5.1)
Sodium: 137 mmol/L (ref 135–145)

## 2023-06-06 LAB — TROPONIN I (HIGH SENSITIVITY)
Troponin I (High Sensitivity): 3 ng/L (ref <18)
Troponin I (High Sensitivity): 3 ng/L (ref <18)

## 2023-06-06 MED ORDER — FAMOTIDINE 20 MG PO TABS
20.0000 mg | ORAL_TABLET | Freq: Once | ORAL | Status: AC
  Administered 2023-06-06: 20 mg via ORAL
  Filled 2023-06-06: qty 1

## 2023-06-06 MED ORDER — LIDOCAINE VISCOUS HCL 2 % MT SOLN
15.0000 mL | Freq: Once | OROMUCOSAL | Status: AC
  Administered 2023-06-06: 15 mL via ORAL
  Filled 2023-06-06: qty 15

## 2023-06-06 MED ORDER — ALUM & MAG HYDROXIDE-SIMETH 200-200-20 MG/5ML PO SUSP
30.0000 mL | Freq: Once | ORAL | Status: AC
  Administered 2023-06-06: 30 mL via ORAL
  Filled 2023-06-06: qty 30

## 2023-06-06 NOTE — ED Provider Notes (Signed)
[facility] Provider Note   CSN: [id number] Arrival date & time: 06/06/23  1117     History  Chief Complaint  Patient presents with   Chest Pain    Ann Bernard is a 78 y.o. female.  With a history of hypertension, nonischemic cardiomyopathy, heart failure and CAD presents to the ED for chest pain.  Chest pain began shortly after breakfast this morning and has persisted into the onset.  Localized over substernal/epigastric region and has a burning quality.  No shortness of breath nausea vomiting or diaphoresis.  Similar episode of chest pain for which she was seen in the emergency department 2 days ago   Chest Pain      Home Medications Prior to Admission medications   Medication Sig Start Date End Date Taking? Authorizing Provider  alendronate (FOSAMAX) 70 MG tablet Take 70 mg by mouth once a week. Every Monday 04/26/19   [provider]  atorvastatin (LIPITOR) 40 MG tablet TAKE 1 TABLET BY MOUTH ONCE A DAY AT [facility] 03/31/23 03/30/24  Laurey Morale, MD  carvedilol (COREG) 12.5 MG tablet Take 1.5 tablets (18.75 mg total) by mouth 2 (two) times daily. (dose increase) 06/07/22   Laurey Morale, MD  cyclobenzaprine (FLEXERIL) 10 MG tablet Take 1 tablet (10 mg total) by mouth 2 (two) times daily as needed for muscle spasms. 05/31/22   Alvira Monday, MD  dapagliflozin propanediol (FARXIGA) 10 MG TABS tablet Take 1 tablet (10 mg total) by mouth daily before breakfast. 06/24/22   Laurey Morale, MD  diphenhydrAMINE (SOMINEX) 25 MG tablet Take 1 tablet by mouth daily as needed for itching.    [provider]  famotidine (PEPCID) 20 MG tablet Take 20 mg by mouth 2 (two) times daily.    [provider]  furosemide (LASIX) 20 MG tablet Take 20 mg by mouth daily as needed.    [provider]  hydrALAZINE (APRESOLINE) 50 MG tablet Take 1 tablet (50 mg total) by mouth in the morning and at bedtime. 05/02/23    Laurey Morale, MD  Multiple Vitamins-Minerals (PRESERVISION AREDS 2 PO) Take 1 capsule by mouth daily.    [provider]  potassium chloride SA (KLOR-CON M) 20 MEQ tablet Take 1 tablet (20 mEq total) by mouth daily. 03/11/23   Laurey Morale, MD  sacubitril-valsartan (ENTRESTO) 24-26 MG Take 1 tablet by mouth 2 (two) times daily. 12/24/22   Graciella Freer, PA-C  spironolactone (ALDACTONE) 25 MG tablet Take 1 tablet (25 mg total) by mouth daily. 04/14/23   Allayne Butcher, PA-C      Allergies    Aspirin    Review of Systems   Review of Systems  Cardiovascular:  Positive for chest pain.    Physical Exam Updated Vital Signs BP 130/75   Pulse 64   Temp 98.8 F (37.1 C) (Oral)   Resp 20   Ht 5\' 7"  (1.702 m)   Wt 70.3 kg   SpO2 100%   BMI 24.28 kg/m  Physical Exam Vitals and nursing note reviewed.  HENT:     Head: Normocephalic and atraumatic.  Eyes:     Pupils: Pupils are equal, round, and reactive to light.  Cardiovascular:     Rate and Rhythm: Normal rate and regular rhythm.  Pulmonary:     Effort: Pulmonary effort is normal.     Breath sounds: Normal breath sounds.  Abdominal:     Palpations: Abdomen is  soft.     Tenderness: There is no abdominal tenderness.  Skin:    General: Skin is warm and dry.  Neurological:     Mental Status: She is alert.  Psychiatric:        Mood and Affect: Mood normal.     ED Results / Procedures / Treatments   Labs (all labs ordered are listed, but only abnormal results are displayed) Labs Reviewed  BASIC METABOLIC PANEL - Abnormal; Notable for the following components:      Result Value   Glucose, Bld 114 (*)    All other components within normal limits  CBC  TROPONIN I (HIGH SENSITIVITY)  TROPONIN I (HIGH SENSITIVITY)    EKG None  Radiology DG Chest 2 View Result Date: 06/06/2023 CLINICAL DATA:  Chest pain. EXAM: CHEST - 2 VIEW COMPARISON:  06/04/2023. FINDINGS: Bilateral lung fields are clear.  Bilateral costophrenic angles are clear. Normal cardio-mediastinal silhouette. There is a left sided 3-lead pacemaker. No acute osseous abnormalities. The soft tissues are within normal limits. IMPRESSION: No active cardiopulmonary disease. Electronically Signed   By: Jules Schick M.D.   On: 06/06/2023 14:21   US Venous Img Lower Bilateral (DVT) Result Date: 06/04/2023 CLINICAL DATA:  Right leg pain, elevated D-dimer and chest pain. EXAM: BILATERAL LOWER EXTREMITY VENOUS DOPPLER ULTRASOUND TECHNIQUE: Gray-scale sonography with graded compression, as well as color Doppler and duplex ultrasound were performed to evaluate the lower extremity deep venous systems from the level of the common femoral vein and including the common femoral, femoral, profunda femoral, popliteal and calf veins including the posterior tibial, peroneal and gastrocnemius veins when visible. The superficial great saphenous vein was also interrogated. Spectral Doppler was utilized to evaluate flow at rest and with distal augmentation maneuvers in the common femoral, femoral and popliteal veins. COMPARISON:  None Available. FINDINGS: RIGHT LOWER EXTREMITY Common Femoral Vein: No evidence of thrombus. Normal compressibility, respiratory phasicity and response to augmentation. Saphenofemoral Junction: No evidence of thrombus. Normal compressibility and flow on color Doppler imaging. Profunda Femoral Vein: No evidence of thrombus. Normal compressibility and flow on color Doppler imaging. Femoral Vein: No evidence of thrombus. Normal compressibility, respiratory phasicity and response to augmentation. Popliteal Vein: No evidence of thrombus. Normal compressibility, respiratory phasicity and response to augmentation. Calf Veins: No evidence of thrombus. Normal compressibility and flow on color Doppler imaging. Superficial Great Saphenous Vein: No evidence of thrombus. Normal compressibility. Venous Reflux:  None. Other Findings:  None. LEFT LOWER  EXTREMITY Common Femoral Vein: No evidence of thrombus. Normal compressibility, respiratory phasicity and response to augmentation. Saphenofemoral Junction: No evidence of thrombus. Normal compressibility and flow on color Doppler imaging. Profunda Femoral Vein: No evidence of thrombus. Normal compressibility and flow on color Doppler imaging. Femoral Vein: No evidence of thrombus. Normal compressibility, respiratory phasicity and response to augmentation. Popliteal Vein: No evidence of thrombus. Normal compressibility, respiratory phasicity and response to augmentation. Calf Veins: No evidence of thrombus. Normal compressibility and flow on color Doppler imaging. Superficial Great Saphenous Vein: No evidence of thrombus. Normal compressibility. Venous Reflux:  None. Other Findings:  None. IMPRESSION: No evidence of deep venous thrombosis in either lower extremity. Electronically Signed   By: Aram Candela M.D.   On: 06/04/2023 20:42    Procedures Procedures    Medications Ordered in ED Medications  alum & mag hydroxide-simeth (MAALOX/MYLANTA) 200-200-20 MG/5ML suspension 30 mL (30 mLs Oral Given 06/06/23 1310)    And  lidocaine (XYLOCAINE) 2 % viscous mouth solution 15 mL (15  mLs Oral Given 06/06/23 1310)  famotidine (PEPCID) tablet 20 mg (20 mg Oral Given 06/06/23 1311)    ED Course/ Medical Decision Making/ A&P Clinical Course as of 06/06/23 1530  Fri Jun 06, 2023  1528 High sensitive troponin of 3 with delta troponin of 2 not consistent with ACS.  No evidence of dysrhythmia or ischemic changes on EKG.  CBC and metabolic panel unremarkable.  Chest x-ray shows no acute disease.  Patient reports improvement after GI cocktail and Pepcid here.  Counseled her on acid reflux symptoms and medication (Pepcid) administration at home.  She will follow-up with her primary care doctor and is stable for discharge at this time [MP]    Clinical Course User Index [MP] Royanne Foots, DO                                  Medical Decision Making 78 year old female with history as above presenting for recurrent substernal chest pain.  Pain began after breakfast while at rest.  No other associated symptoms.  Was seen for similar here in the ED 2 days ago and discharged after negative cardiac workup and improvement after acid reflux medications.  Afebrile and hemodynamically stable.  Benign physical exam.  Differential diagnosis includes ACS, dysrhythmia, pneumonia, viral respiratory illness and acid reflux.  Will obtain laboratory workup including high-sensitivity troponin, EKG, chest x-ray and continue to monitor on cardiac monitor  Amount and/or Complexity of Data Reviewed Labs: ordered. Radiology: ordered.  Risk OTC drugs. Prescription drug management.           Final Clinical Impression(s) / ED Diagnoses Final diagnoses:  Chest pain, unspecified type  Gastroesophageal reflux disease, unspecified whether esophagitis present    Rx / DC Orders ED Discharge Orders     None         Royanne Foots, DO 06/06/23 1530

## 2023-06-06 NOTE — ED Notes (Signed)
Pt alert and oriented X 4 at the time of discharge. RR even and unlabored. No acute distress noted. Pt verbalized understanding of discharge instructions as discussed. Pt ambulatory to lobby at time of discharge.

## 2023-06-06 NOTE — Discharge Instructions (Signed)
You were seen in the Emergency Department again for chest pain Your EKG blood work chest x-ray all looked okay Your symptoms improved after medications to help with acid reflux You should continue taking Pepcid as previously prescribed It is importantly follow-up with your primary care doctor for reevaluation Return to the emergency department for

## 2023-06-06 NOTE — ED Triage Notes (Signed)
Pt c/o cental chest pain that started this morning.

## 2023-06-13 ENCOUNTER — Other Ambulatory Visit: Payer: Self-pay | Admitting: *Deleted

## 2023-06-13 ENCOUNTER — Other Ambulatory Visit: Payer: Self-pay

## 2023-06-13 ENCOUNTER — Other Ambulatory Visit (HOSPITAL_COMMUNITY): Payer: Self-pay

## 2023-06-13 MED ORDER — FUROSEMIDE 20 MG PO TABS
20.0000 mg | ORAL_TABLET | Freq: Every day | ORAL | 0 refills | Status: DC
  Filled 2023-06-13 (×2): qty 3, 3d supply, fill #0

## 2023-06-16 ENCOUNTER — Other Ambulatory Visit (HOSPITAL_COMMUNITY): Payer: Self-pay

## 2023-06-17 ENCOUNTER — Other Ambulatory Visit: Payer: Self-pay

## 2023-06-20 ENCOUNTER — Other Ambulatory Visit (HOSPITAL_COMMUNITY): Payer: Self-pay

## 2023-06-25 ENCOUNTER — Encounter (HOSPITAL_BASED_OUTPATIENT_CLINIC_OR_DEPARTMENT_OTHER): Payer: Self-pay

## 2023-06-25 ENCOUNTER — Other Ambulatory Visit: Payer: Self-pay

## 2023-06-25 ENCOUNTER — Emergency Department (HOSPITAL_BASED_OUTPATIENT_CLINIC_OR_DEPARTMENT_OTHER): Payer: Medicare HMO

## 2023-06-25 ENCOUNTER — Emergency Department (HOSPITAL_BASED_OUTPATIENT_CLINIC_OR_DEPARTMENT_OTHER)
Admission: EM | Admit: 2023-06-25 | Discharge: 2023-06-25 | Disposition: A | Discharge status: Home / Self Care | Payer: Medicare HMO | Attending: Emergency Medicine | Admitting: Emergency Medicine

## 2023-06-25 DIAGNOSIS — R072 Precordial pain: Secondary | ICD-10-CM | POA: Insufficient documentation

## 2023-06-25 DIAGNOSIS — I10 Essential (primary) hypertension: Secondary | ICD-10-CM | POA: Insufficient documentation

## 2023-06-25 DIAGNOSIS — I2510 Atherosclerotic heart disease of native coronary artery without angina pectoris: Secondary | ICD-10-CM | POA: Insufficient documentation

## 2023-06-25 DIAGNOSIS — I447 Left bundle-branch block, unspecified: Secondary | ICD-10-CM | POA: Insufficient documentation

## 2023-06-25 LAB — BASIC METABOLIC PANEL
Anion gap: 7 (ref 5–15)
BUN: 16 mg/dL (ref 8–23)
CO2: 22 mmol/L (ref 22–32)
Calcium: 9.1 mg/dL (ref 8.9–10.3)
Chloride: 106 mmol/L (ref 98–111)
Creatinine, Ser: 0.76 mg/dL (ref 0.44–1.00)
GFR, Estimated: >60 mL/min (ref >60)
Glucose, Bld: 114 mg/dL — ABNORMAL HIGH (ref 70–99)
Potassium: 4.2 mmol/L (ref 3.5–5.1)
Sodium: 135 mmol/L (ref 135–145)

## 2023-06-25 LAB — CBC WITH DIFFERENTIAL/PLATELET
Abs Immature Granulocytes: 0.02 10*3/uL (ref 0.00–0.07)
Basophils Absolute: 0 10*3/uL (ref 0.0–0.1)
Basophils Relative: 1 %
Eosinophils Absolute: 0.1 10*3/uL (ref 0.0–0.5)
Eosinophils Relative: 1 %
HCT: 42.1 % (ref 36.0–46.0)
Hemoglobin: 14.2 g/dL (ref 12.0–15.0)
Immature Granulocytes: 0 %
Lymphocytes Relative: 16 %
Lymphs Abs: 1.1 10*3/uL (ref 0.7–4.0)
MCH: 33.3 pg (ref 26.0–34.0)
MCHC: 33.7 g/dL (ref 30.0–36.0)
MCV: 98.6 fL (ref 80.0–100.0)
Monocytes Absolute: 0.8 10*3/uL (ref 0.1–1.0)
Monocytes Relative: 11 %
Neutro Abs: 5 10*3/uL (ref 1.7–7.7)
Neutrophils Relative %: 71 %
Platelets: 147 10*3/uL — ABNORMAL LOW (ref 150–400)
RBC: 4.27 MIL/uL (ref 3.87–5.11)
RDW: 13.2 % (ref 11.5–15.5)
WBC: 7 10*3/uL (ref 4.0–10.5)
nRBC: 0 % (ref 0.0–0.2)

## 2023-06-25 LAB — TROPONIN I (HIGH SENSITIVITY): Troponin I (High Sensitivity): 3 ng/L (ref <18)

## 2023-06-25 LAB — BRAIN NATRIURETIC PEPTIDE: B Natriuretic Peptide: 42.3 pg/mL (ref 0.0–100.0)

## 2023-06-25 MED ORDER — ALUM & MAG HYDROXIDE-SIMETH 200-200-20 MG/5ML PO SUSP
30.0000 mL | Freq: Once | ORAL | Status: AC
  Administered 2023-06-25: 30 mL via ORAL
  Filled 2023-06-25: qty 30

## 2023-06-25 NOTE — ED Provider Notes (Signed)
 Emergency Department Provider Note   I have reviewed the triage vital signs and the nursing notes.   HISTORY  Chief Complaint Chest Pain   HPI Ann Bernard is a 78 y.o. female with past history of CAD and hypertension presents emergency department with constant heaviness in the center chest.  Symptoms were present upon waking this morning and have been constant since that time.  Denies any nausea, vomiting, diaphoresis.  No exertional chest pain symptoms.  No pleuritic pain.  No shortness of breath.    Past Medical History:  Diagnosis Date   Coronary artery disease    Hypertension     Review of Systems  Constitutional: No fever/chills Cardiovascular: Positive chest pain. Respiratory: Denies shortness of breath. Gastrointestinal: No abdominal pain.  No nausea, no vomiting.  No diarrhea.   Musculoskeletal: Negative for back pain. Skin: Negative for rash. Neurological: Negative for headaches.  ____________________________________________   PHYSICAL EXAM:  VITAL SIGNS: ED Triage Vitals  Encounter Vitals Group     BP 06/25/23 1302 127/71     Pulse Rate 06/25/23 1302 64     Resp 06/25/23 1302 18     Temp 06/25/23 1302 97.8 F (36.6 C)     Temp src --      SpO2 06/25/23 1302 99 %     Weight 06/25/23 1303 155 lb (70.3 kg)     Height 06/25/23 1303 5' 7 (1.702 m)    Constitutional: Alert and oriented. Well appearing and in no acute distress. Eyes: Conjunctivae are normal.  Head: Atraumatic. Nose: No congestion/rhinnorhea. Mouth/Throat: Mucous membranes are moist.  Neck: No stridor.   Cardiovascular: Normal rate, regular rhythm. Good peripheral circulation. Grossly normal heart sounds.   Respiratory: Normal respiratory effort.  No retractions. Lungs CTAB. Gastrointestinal: Soft and nontender. No distention.  Musculoskeletal: No lower extremity tenderness nor edema. No gross deformities of extremities. Neurologic:  Normal speech and language. No gross focal  neurologic deficits are appreciated.  Skin:  Skin is warm, dry and intact. No rash noted.  ____________________________________________   LABS (all labs ordered are listed, but only abnormal results are displayed)  Labs Reviewed  CBC WITH DIFFERENTIAL/PLATELET - Abnormal; Notable for the following components:      Result Value   Platelets 147 (*)    All other components within normal limits  BASIC METABOLIC PANEL - Abnormal; Notable for the following components:   Glucose, Bld 114 (*)    All other components within normal limits  BRAIN NATRIURETIC PEPTIDE  TROPONIN I (HIGH SENSITIVITY)   ____________________________________________  EKG   EKG Interpretation Date/Time:  Wednesday June 25 2023 13:05:32 EST Ventricular Rate:  68 PR Interval:  156 QRS Duration:  140 QT Interval:  409 QTC Calculation: 435 R Axis:   -18  Text Interpretation: Sinus rhythm Left bundle branch block Confirmed by Ruthe Cornet [phone]) on 06/25/2023 2:28:56 PM        ____________________________________________  RADIOLOGY  DG Chest Portable 1 View Result Date: 06/25/2023 CLINICAL DATA:  Pain EXAM: PORTABLE CHEST 1 VIEW COMPARISON:  Chest x-ray 06/06/2023 FINDINGS: Left-sided ICD is again noted. The cardiomediastinal silhouette is within normal limits. The lungs are clear. There is no pleural effusion or pneumothorax. No acute fractures are seen. IMPRESSION: No active disease. Electronically Signed   By: Greig Pique M.D.   On: 06/25/2023 18:05    ____________________________________________   PROCEDURES  Procedure(s) performed:   Procedures  None  ____________________________________________   INITIAL IMPRESSION / ASSESSMENT AND PLAN / ED  COURSE  Pertinent labs & imaging results that were available during my care of the patient were reviewed by me and considered in my medical decision making (see chart for details).   This patient is Presenting for Evaluation of CP, which does  require a range of treatment options, and is a complaint that involves a high risk of morbidity and mortality.  The Differential Diagnoses includes but is not exclusive to acute coronary syndrome, aortic dissection, pulmonary embolism, cardiac tamponade, community-acquired pneumonia, pericarditis, musculoskeletal chest wall pain, etc.   Critical Interventions-    Medications  alum & mag hydroxide-simeth (MAALOX/MYLANTA) 200-200-20 MG/5ML suspension 30 mL (has no administration in time range)    Reassessment after intervention: symptoms improved.    I did obtain Additional Historical Information from family at bedside.    Clinical Laboratory Tests Ordered, included troponin normal with greater than 6 hours of constant pain.  Do not plan on trending.  BNP normal.  Basic metabolic panel without acute kidney injury.  CBC without anemia.  Radiologic Tests Ordered, included CXR. I independently interpreted the images and agree with radiology interpretation.   Cardiac Monitor Tracing which shows NSR.    Social Determinants of Health Risk patient is a non-smoker.   Medical Decision Making: Summary:  Patient presents emergency department with constant, central chest pressure without provoking factors.  Symptoms improved with Maalox.  Low suspicion overall for ACS despite several risk factors.  EKG was reassuring.  She follows with cardiology with an appointment in late March.  Has been seen by cardiology late last year with similar pressure-like symptoms but reassuring workup.  Do not feel this represents unstable angina or other ACS spectrum disorder. Discussed strict ED return precautions.   Considered admission but ED workup is reassuring.   Patient's presentation is most consistent with acute presentation with potential threat to life or bodily function.   Disposition: discharge  ____________________________________________  FINAL CLINICAL IMPRESSION(S) / ED DIAGNOSES  Final  diagnoses:  Precordial chest pain    Note:  This document was prepared using Dragon voice recognition software and may include unintentional dictation errors.  Fonda Law, MD, [facility] Emergency Medicine    Loleta Frommelt, Fonda MATSU, MD 06/25/23 [phone]

## 2023-06-25 NOTE — ED Triage Notes (Addendum)
Patient arrives POV c/o chest pain in middle of chest that started this morning; patient reports pain as sore and a 7/10. Patient a&o x4, no acute distress during triage. Patient has pacemaker; patient took 20 mg omperazole this morning.

## 2023-06-25 NOTE — Discharge Instructions (Signed)

## 2023-06-30 NOTE — Progress Notes (Signed)
 Remote ICD transmission.

## 2023-07-04 ENCOUNTER — Other Ambulatory Visit (HOSPITAL_COMMUNITY): Payer: Self-pay

## 2023-07-05 ENCOUNTER — Other Ambulatory Visit (HOSPITAL_COMMUNITY): Payer: Self-pay

## 2023-07-11 NOTE — Progress Notes (Signed)
 ok

## 2023-07-15 ENCOUNTER — Telehealth: Payer: Self-pay

## 2023-07-15 NOTE — Telephone Encounter (Signed)
 Referred to [facility] clinic by Dr Elberta Fortis.   Spoke with son Berna Spare per DPR and ICM intro given.  He is agreeable to ICM monthly follow up for patient.  Provided ICM direct number and explained should call if experiencing any fluid symptoms such as weight gain, shortness of breath or extremity/abdominal swelling.  1st ICM remote transmission scheduled for 07/28/2023.

## 2023-07-15 NOTE — Telephone Encounter (Signed)
 Baird Lyons, RN to Me  Pt only takes Lasix PRN and reports she has not taken this in a very long time.  With this new information, pt advised to take lasix 20 mg daily for 3 days then return to PRN dosing. Aware RN from [facility] clinic will call to establish her.  Call Taylor, son, [phone].  Regan Lemming, MD to Baird Lyons, RN    Result Note Abnormal device interrogation reviewed.  Lead parameters and battery status stable.  Volume elevated. Increase lasix to 40 mg daily for 3 days and low salt diet. Needs ICM clinic referral.

## 2023-07-21 ENCOUNTER — Telehealth: Payer: Self-pay

## 2023-07-21 NOTE — Telephone Encounter (Signed)
 FMLA paperwork faxed on 07/21/23, spoke with son Toshika Parrow and informed him

## 2023-07-28 ENCOUNTER — Ambulatory Visit: Payer: Medicare HMO | Attending: Cardiology

## 2023-07-28 DIAGNOSIS — Z95810 Presence of automatic (implantable) cardiac defibrillator: Secondary | ICD-10-CM

## 2023-07-28 DIAGNOSIS — I5022 Chronic systolic (congestive) heart failure: Secondary | ICD-10-CM | POA: Diagnosis not present

## 2023-07-30 NOTE — Progress Notes (Signed)
 EPIC Encounter for ICM Monitoring  Patient Name: Ann Bernard is a 78 y.o. female Date: 07/30/2023 Primary Care Physican: Ann Cleverly, PA Primary Cardiologist: Ann Bernard Electrophysiologist: Ann Bernard Bi-V Pacing: >99%  07/30/2023 Weight:  unknown  AT/AF Burden <1%      1st ICM Remote Transmission.  Attempted call to son, Ann Bernard per [facility] and unable to reach.  Transmission results reviewed.    CorVue thoracic impedance suggesting possible dryness starting 3/6.  Was suggesting possible fluid accumulation from 2/21-3/5.   Message sent to device clinic triage 3/10 to review NSVT (172 bpm/331ms) episode and per device nurse no follow up needed since episode was <20 beats.  Prescribed:  Furosemide 20 mg take 1 tablet(s) (20 mg total) by mouth daily as needed. Potassium 20 mEq take 1 tablet(s) (20 mEq total) by mouth daily. Spironolactone 25 mg take 1 tablet by mouth daily  Labs: 06/25/2023 Creatinine 0.76, BUN 16, Potassium 4.2, Sodium 135, GFR >60  06/06/2023 Creatinine 0.91, BUN 15, Potassium 3.8, Sodium 137, GFR >60  06/04/2023 Creatinine 0.71, BUN 15, Potassium 3.8, Sodium 134, GFR >60  A complete set of results can be found in Results Review.  Recommendations: Unable to reach.    Follow-up plan: ICM clinic phone appointment on 08/04/2023 to recheck fluid levels.   91 day device clinic remote transmission 08/22/2023.    EP/Cardiology Office Visits: 08/15/2023 with Dr. Shirlee Bernard.    Copy of ICM check sent to Dr. Elberta Bernard.   3 month ICM trend: 07/28/2023.    12-14 Month ICM trend:     Ann Soda, RN 07/30/2023 7:29 AM

## 2023-08-04 ENCOUNTER — Ambulatory Visit: Attending: Cardiology

## 2023-08-04 DIAGNOSIS — I5022 Chronic systolic (congestive) heart failure: Secondary | ICD-10-CM

## 2023-08-04 DIAGNOSIS — Z95810 Presence of automatic (implantable) cardiac defibrillator: Secondary | ICD-10-CM

## 2023-08-05 NOTE — Progress Notes (Signed)
 EPIC Encounter for ICM Monitoring  Patient Name: Ann Bernard is a 78 y.o. female Date: 08/05/2023 Primary Care Physican: Shellia Cleverly, PA Primary Cardiologist: Shirlee Latch Electrophysiologist: Elberta Fortis Bi-V Pacing: >99%          07/30/2023 Weight:  unknown   AT/AF Burden <1%                                                    Transmission results reviewed.    CorVue thoracic impedance suggesting fluid levels returned to normal   Prescribed:  Furosemide 20 mg take 1 tablet(s) (20 mg total) by mouth daily as needed. Potassium 20 mEq take 1 tablet(s) (20 mEq total) by mouth daily. Spironolactone 25 mg take 1 tablet by mouth daily   Labs: 06/25/2023 Creatinine 0.76, BUN 16, Potassium 4.2, Sodium 135, GFR >60  06/06/2023 Creatinine 0.91, BUN 15, Potassium 3.8, Sodium 137, GFR >60  06/04/2023 Creatinine 0.71, BUN 15, Potassium 3.8, Sodium 134, GFR >60  A complete set of results can be found in Results Review.   Recommendations:  No changes.   Follow-up plan: ICM clinic phone appointment on 09/01/2023.   91 day device clinic remote transmission 08/22/2023.     EP/Cardiology Office Visits: 08/15/2023 with Dr. Shirlee Latch.     Copy of ICM check sent to Dr. Elberta Fortis.   3 month ICM trend: 08/04/2023.    12-14 Month ICM trend:     Karie Soda, RN 08/05/2023 2:46 PM

## 2023-08-14 ENCOUNTER — Telehealth (HOSPITAL_COMMUNITY): Payer: Self-pay | Admitting: Cardiology

## 2023-08-14 NOTE — Telephone Encounter (Signed)
 Called to confirm/remind patient of their appointment at the [facility] on 08/14/23.   Appointment:   [] Confirmed  [] Left mess   [x] No answer/No voice mail  [] Phone not in service  Patient reminded to bring all medications and/or complete list.  Confirmed patient has transportation. Gave directions, instructed to utilize valet parking.

## 2023-08-15 ENCOUNTER — Encounter (HOSPITAL_COMMUNITY): Payer: Self-pay | Admitting: Cardiology

## 2023-08-15 ENCOUNTER — Ambulatory Visit (HOSPITAL_COMMUNITY)
Admission: RE | Admit: 2023-08-15 | Discharge: 2023-08-15 | Disposition: A | Discharge status: Home / Self Care | Payer: Medicare HMO | Source: Ambulatory Visit | Attending: Cardiology | Admitting: Cardiology

## 2023-08-15 ENCOUNTER — Other Ambulatory Visit (HOSPITAL_COMMUNITY): Payer: Self-pay

## 2023-08-15 VITALS — BP 102/60 | HR 73 | Wt 160.4 lb

## 2023-08-15 DIAGNOSIS — Z79899 Other long term (current) drug therapy: Secondary | ICD-10-CM | POA: Diagnosis not present

## 2023-08-15 DIAGNOSIS — I5022 Chronic systolic (congestive) heart failure: Secondary | ICD-10-CM | POA: Diagnosis not present

## 2023-08-15 DIAGNOSIS — Z8249 Family history of ischemic heart disease and other diseases of the circulatory system: Secondary | ICD-10-CM | POA: Insufficient documentation

## 2023-08-15 DIAGNOSIS — I110 Hypertensive heart disease with heart failure: Secondary | ICD-10-CM | POA: Insufficient documentation

## 2023-08-15 DIAGNOSIS — I2510 Atherosclerotic heart disease of native coronary artery without angina pectoris: Secondary | ICD-10-CM | POA: Insufficient documentation

## 2023-08-15 DIAGNOSIS — I428 Other cardiomyopathies: Secondary | ICD-10-CM | POA: Insufficient documentation

## 2023-08-15 DIAGNOSIS — I447 Left bundle-branch block, unspecified: Secondary | ICD-10-CM | POA: Insufficient documentation

## 2023-08-15 DIAGNOSIS — E785 Hyperlipidemia, unspecified: Secondary | ICD-10-CM | POA: Diagnosis not present

## 2023-08-15 DIAGNOSIS — Z95810 Presence of automatic (implantable) cardiac defibrillator: Secondary | ICD-10-CM | POA: Insufficient documentation

## 2023-08-15 LAB — BASIC METABOLIC PANEL WITH GFR
Anion gap: 8 (ref 5–15)
BUN: 10 mg/dL (ref 8–23)
CO2: 27 mmol/L (ref 22–32)
Calcium: 9.4 mg/dL (ref 8.9–10.3)
Chloride: 105 mmol/L (ref 98–111)
Creatinine, Ser: 0.79 mg/dL (ref 0.44–1.00)
GFR, Estimated: >60 mL/min (ref >60)
Glucose, Bld: 102 mg/dL — ABNORMAL HIGH (ref 70–99)
Potassium: 3.8 mmol/L (ref 3.5–5.1)
Sodium: 140 mmol/L (ref 135–145)

## 2023-08-15 LAB — BRAIN NATRIURETIC PEPTIDE: B Natriuretic Peptide: 53.3 pg/mL (ref 0.0–100.0)

## 2023-08-15 NOTE — Patient Instructions (Signed)
 PLEASE MAKE SURE YOU TAKE YOUR ENTRESTO AND CARVEDILOL TWICE A DAY.  STOP Hydralazine.  Labs done today, your results will be available in MyChart, we will contact you for abnormal readings.  Your physician has requested that you have an echocardiogram. Echocardiography is a painless test that uses sound waves to create images of your heart. It provides your doctor with information about the size and shape of your heart and how well your heart's chambers and valves are working. This procedure takes approximately one hour. There are no restrictions for this procedure. Please do NOT wear cologne, perfume, aftershave, or lotions (deodorant is allowed). Please arrive 15 minutes prior to your appointment time.  Please note: We ask at that you not bring children with you during ultrasound (echo/ vascular) testing. Due to room size and safety concerns, children are not allowed in the ultrasound rooms during exams. Our front office staff cannot provide observation of children in our lobby area while testing is being conducted. An adult accompanying a patient to their appointment will only be allowed in the ultrasound room at the discretion of the ultrasound technician under special circumstances. We apologize for any inconvenience.  Your physician recommends that you schedule a follow-up appointment in: 5 months   If you have any questions or concerns before your next appointment please send [place] a message through [place] or call our office at [phone].    TO LEAVE A MESSAGE FOR THE NURSE SELECT OPTION 2, PLEASE LEAVE A MESSAGE INCLUDING: YOUR NAME DATE OF BIRTH CALL BACK NUMBER REASON FOR CALL**this is important as we prioritize the call backs  YOU WILL RECEIVE A CALL BACK THE SAME DAY AS LONG AS YOU CALL BEFORE 4:00 PM At the [facility], you and your health needs are our priority. As part of our continuing mission to provide you with exceptional heart care, we have created  designated Provider Care Teams. These Care Teams include your primary Cardiologist (physician) and Advanced Practice Providers (APPs- Physician Assistants and Nurse Practitioners) who all work together to provide you with the care you need, when you need it.   You may see any of the following providers on your designated Care Team at your next follow up: Dr Arvilla Meres Dr Marca Ancona Dr. Dorthula Nettles Dr. Clearnce Hasten Amy Filbert Schilder, NP Robbie Lis, [place] [facility] [place], [place] Brynda Peon, NP [place] Lee, NP Clarisa Kindred, NP Karle Plumber, PharmD Enos Fling, PharmD   Please be sure to bring in all your medications bottles to every appointment.    Thank you for choosing [facility]

## 2023-08-17 NOTE — Progress Notes (Signed)
 PCP: Shellia Cleverly, PA HF Cardiology: Dr. Shirlee Latch  Chief complaint: CHF  Ann Bernard is a 78 y.o. with history of HTN, hyperlipidemia, tobacco abuse, and chronic systolic heart failure. Of note, her brother has heart failure and has an ICD.    Admitted to [facility] in 10/20 with acute systolic heart failure. CTA negative for PE. ECHO was completed and showed severely reduced EF. Diagnostic cath was performed as noted below, mild coronary disease not likely to have caused cardiomyopathy.  Diuresed with IV lasix and transitioned to lasix 20 mg daily. Started on digoxin, carvedilol, and Entresto. Discharge weight 174 pounds.      Echo in 2/21 showed EF 20-25% with septal-lateral dyssynchrony.  Patient had St Jude CRT-D device placed.   Echo in 7/21 showed EF up to 50% with mild LVH, normal RV.  Echo (7/23) showed EF 50%, RV normal, mild MR.   Echo in 7/23 with stable EF 50%.  Seen recently in the ED x 2 on 11/20/23 and again on 11/24/23, both times for atypical chest pain. W/u unremarkable. Hs trops negative. D-dimer elevated on 7/7 however chest CT was negative for PNA. Etiology felt to be GI. Suspected GERD. Was given Rx for Pepcid.   ER with atypical chest pain in 2/25, troponin negative.   She returns for followup of CHF.  No further chest pain. No dyspnea with usual activities.  Does ok on stairs and walking around the grocery store.  No lightheadedness or palpitations.  No orthopnea/PND.  Weight up 5 lbs.  No Lasix use.   ECG (personally reviewed): NSR, BiV pacing.   Device interrogation: Stable thoracic impedance. >99% BiV pacing. No AF, 1 run NSVT.    Labs (2/21): digoxin 0.5, K 3.8, creatinine 0.78 Labs (4/21): LDL 64, K 3.6, creatinine 1.47 Labs (7/22): K 3.6, creatinine 0.98 Labs (1/24): K 3.2, creatinine 0.86 Labs (7/24): K 3.8, creatinine 1.01 Labs (8/24): K 4.3, creatinine 0.89  Labs (2/25): K 4.2, creatinine 0.76, BNP 42  PMH: 1. Chronic systolic CHF: Nonischemic  cardiomyopathy.  - Echo (10/20): EF < 20%, moderately dilated LV, severe global HK.  - RHC/LHC (10/20): D1 and D2 with 50% ostial stenosis.  PCWP 23, CI 2.34.  - CMRI (10/20): Moderately dilated LV with EF 13%, diffuse hypokinesis, septal-lateral dyssynchrony, normal RV size with EF 30%, nonspecific RV insertion site LGE.   - Echo (2/21): EF 20-25%, septal-lateral dyssynchrony, mild AI, mild-moderately decreased RV systolic function.  - St Jude CRT-D device placed.  - Echo (7/21): EF 50%, mild LVH, normal RV - Echo (7/23): EF 50%, RV normal, mild MR 2. Active smoker.  3. Hyperlipidemia 4. HTN 5. ABIs (2/21): Normal.   ROS: All systems negative except as listed in HPI, PMH and Problem List.  \SH:  Social History   Socioeconomic History   Marital status: Widowed    Spouse name: Not on file   Number of children: Not on file   Years of education: Not on file   Highest education level: Not on file  Occupational History   Not on file  Tobacco Use   Smoking status: Former    Types: Cigarettes   Smokeless tobacco: Never  Vaping Use   Vaping status: Never Used  Substance and Sexual Activity   Alcohol use: No   Drug use: Never   Sexual activity: Not Currently  Other Topics Concern   Not on file  Social History Narrative   Not on file   Social Drivers of  Health   Financial Resource Strain: Low Risk  (08/05/2019)   Overall Financial Resource Strain (CARDIA)    Difficulty of Paying Living Expenses: Not very hard  Food Insecurity: No Food Insecurity (08/05/2019)   Hunger Vital Sign    Worried About Running Out of Food in the Last Year: Never true    Ran Out of Food in the Last Year: Never true  Transportation Needs: No Transportation Needs (08/05/2019)   PRAPARE - Administrator, Civil Service (Medical): No    Lack of Transportation (Non-Medical): No  Physical Activity: Unknown (03/15/2019)   Exercise Vital Sign    Days of Exercise per Week: 0 days    Minutes of  Exercise per Session: Not on file  Stress: No Stress Concern Present (03/15/2019)   [employer] of Occupational Health - Occupational Stress Questionnaire    Feeling of Stress : Not at all  Social Connections: Moderately Isolated (03/15/2019)   Social Connection and Isolation Panel [NHANES]    Frequency of Communication with Friends and Family: More than three times a week    Frequency of Social Gatherings with Friends and Family: Once a week    Attends Religious Services: More than 4 times per year    Active Member of [employer] or Organizations: No    Attends Banker Meetings: Never    Marital Status: Widowed  Intimate Partner Violence: Not At Risk (03/15/2019)   Humiliation, Afraid, Rape, and Kick questionnaire    Fear of Current or Ex-Partner: No    Emotionally Abused: No    Physically Abused: No    Sexually Abused: No    FH:  Family History  Problem Relation Age of Onset   Hypertension Mother    Diabetes Mother    Diabetes Brother    Alcoholism Father    Diabetes type I Grandson     Current Outpatient Medications  Medication Sig Dispense Refill   alendronate (FOSAMAX) 70 MG tablet Take 70 mg by mouth once a week. Every Monday     atorvastatin (LIPITOR) 40 MG tablet TAKE 1 TABLET BY MOUTH ONCE A DAY AT 6PM 90 tablet 3   carvedilol (COREG) 12.5 MG tablet Take 12.5 mg by mouth 2 (two) times daily with a meal.     cyclobenzaprine (FLEXERIL) 10 MG tablet Take 1 tablet (10 mg total) by mouth 2 (two) times daily as needed for muscle spasms. 20 tablet 0   dapagliflozin propanediol (FARXIGA) 10 MG TABS tablet Take 1 tablet (10 mg total) by mouth daily before breakfast. 90 tablet 3   diphenhydrAMINE (SOMINEX) 25 MG tablet Take 1 tablet by mouth daily as needed for itching.     famotidine (PEPCID) 20 MG tablet Take 20 mg by mouth 2 (two) times daily.     furosemide (LASIX) 20 MG tablet Take 20 mg by mouth daily as needed.     Multiple Vitamins-Minerals (PRESERVISION  AREDS 2 PO) Take 1 capsule by mouth daily.     potassium chloride SA (KLOR-CON M) 20 MEQ tablet Take 1 tablet (20 mEq total) by mouth daily. 60 tablet 3   sacubitril-valsartan (ENTRESTO) 24-26 MG Take 1 tablet by mouth 2 (two) times daily. 60 tablet 3   spironolactone (ALDACTONE) 25 MG tablet Take 1 tablet (25 mg total) by mouth daily. 90 tablet 1   No current facility-administered medications for this encounter.    Vitals:   08/15/23 1030  BP: 102/60  Pulse: 73  SpO2: 100%  Weight: 72.8  kg (160 lb 6.4 oz)    Wt Readings from Last 3 Encounters:  08/15/23 72.8 kg (160 lb 6.4 oz)  06/25/23 70.3 kg (155 lb)  06/06/23 70.3 kg (155 lb)   PHYSICAL EXAM: General: NAD Neck: No JVD, no thyromegaly or thyroid nodule.  Lungs: Clear to auscultation bilaterally with normal respiratory effort. CV: Nondisplaced PMI.  Heart regular S1/S2, no S3/S4, no murmur.  No peripheral edema.  No carotid bruit.  Normal pedal pulses.  Abdomen: Soft, nontender, no hepatosplenomegaly, no distention.  Skin: Intact without lesions or rashes.  Neurologic: Alert and oriented x 3.  Psych: Normal affect. Extremities: No clubbing or cyanosis.  HEENT: Normal.   ASSESSMENT & PLAN: 1. Chronic Systolic CHF: Nonischemic cardiomyopathy. Echo 10/20 with EF < 20%.  LHC/RHC 10/20 showed nonobstructive coronary disease, CI 2.3.  Cardiac MRI (10/20) showed EF 13%, moderate LV dilation, septal-lateral dyssynchrony c/w LBBB and diffuse severe hypokinesis, normal RV size with moderate systolic dysfunction EF 30%, mild-moderate MR, moderate AI, LGE at inferior RV insertion site (nonspecific). Echo in 2/21 showed that EF remained 20-25%. She has a wide LBBB => St Jude CRT-D device placed. 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  Echo in 7/21 showed improvement in EF up to 50%, Echo in 7/23 with stable EF 50%.  NYHA class I-II, not volume overloaded by exam or Corvue.  - Continue Coreg 18.75, increase to bid  (only taking daily).   - Only taking Entresto once daily, increase Entresto to 24/26 bid.  - Continue spironolactone 25 mg daily.  - Continue Farxiga 10 mg daily.  - Stop hydralazine after increasing Coreg and Entresto to bid. - BMET today.   - I will arrange for repeat echo.  2. CAD: Nonobstructive CAD. No chest pain.  - Continue atorvastatin  Followup 4 months with APP.   I spent 31 minutes reviewing records, interviewing/examining patient, and managing orders.     Marca Ancona,  08/17/2023

## 2023-08-22 ENCOUNTER — Ambulatory Visit: Payer: Self-pay | Attending: Cardiology

## 2023-08-22 DIAGNOSIS — I428 Other cardiomyopathies: Secondary | ICD-10-CM | POA: Diagnosis not present

## 2023-08-22 LAB — CUP PACEART REMOTE DEVICE CHECK
Battery Remaining Longevity: 38 mo
Battery Remaining Percentage: 42 %
Battery Voltage: 2.95 V
Brady Statistic AP VP Percent: 12 %
Brady Statistic AP VS Percent: 1 %
Brady Statistic AS VP Percent: 87 %
Brady Statistic AS VS Percent: 1 %
Brady Statistic RA Percent Paced: 11 %
Date Time Interrogation Session: 20250404020218
HighPow Impedance: 65 Ohm
Implantable Lead Connection Status: 753985
Implantable Lead Connection Status: 753985
Implantable Lead Connection Status: 753985
Implantable Lead Implant Date: 20210409
Implantable Lead Implant Date: 20210409
Implantable Lead Implant Date: 20210409
Implantable Lead Location: 753858
Implantable Lead Location: 753859
Implantable Lead Location: 753860
Implantable Pulse Generator Implant Date: 20210409
Lead Channel Impedance Value: 440 Ohm
Lead Channel Impedance Value: 460 Ohm
Lead Channel Impedance Value: 750 Ohm
Lead Channel Pacing Threshold Amplitude: 0.625 V
Lead Channel Pacing Threshold Amplitude: 0.75 V
Lead Channel Pacing Threshold Amplitude: 1.375 V
Lead Channel Pacing Threshold Pulse Width: 0.5 ms
Lead Channel Pacing Threshold Pulse Width: 0.5 ms
Lead Channel Pacing Threshold Pulse Width: 1 ms
Lead Channel Sensing Intrinsic Amplitude: 1.8 mV
Lead Channel Sensing Intrinsic Amplitude: 11.9 mV
Lead Channel Setting Pacing Amplitude: 1.625
Lead Channel Setting Pacing Amplitude: 1.875
Lead Channel Setting Pacing Amplitude: 2 V
Lead Channel Setting Pacing Pulse Width: 0.5 ms
Lead Channel Setting Pacing Pulse Width: 1 ms
Lead Channel Setting Sensing Sensitivity: 0.5 mV
Pulse Gen Serial Number: 111006673
Zone Setting Status: 755011

## 2023-09-01 ENCOUNTER — Ambulatory Visit (INDEPENDENT_AMBULATORY_CARE_PROVIDER_SITE_OTHER)

## 2023-09-01 ENCOUNTER — Ambulatory Visit (HOSPITAL_COMMUNITY)
Admission: RE | Admit: 2023-09-01 | Discharge: 2023-09-01 | Disposition: A | Discharge status: Home / Self Care | Source: Ambulatory Visit | Attending: Cardiology | Admitting: Cardiology

## 2023-09-01 ENCOUNTER — Encounter

## 2023-09-01 DIAGNOSIS — I5022 Chronic systolic (congestive) heart failure: Secondary | ICD-10-CM | POA: Diagnosis present

## 2023-09-01 DIAGNOSIS — I083 Combined rheumatic disorders of mitral, aortic and tricuspid valves: Secondary | ICD-10-CM | POA: Insufficient documentation

## 2023-09-01 DIAGNOSIS — Z95810 Presence of automatic (implantable) cardiac defibrillator: Secondary | ICD-10-CM | POA: Diagnosis not present

## 2023-09-01 LAB — ECHOCARDIOGRAM COMPLETE
Calc EF: 42.8 %
P 1/2 time: 520 ms
Single Plane A2C EF: 49 %
Single Plane A4C EF: 33.3 %

## 2023-09-01 NOTE — Progress Notes (Signed)
  Echocardiogram 2D Echocardiogram has been performed.  Dione Franks 09/01/2023, 8:52 AM

## 2023-09-03 ENCOUNTER — Telehealth: Payer: Self-pay

## 2023-09-03 NOTE — Progress Notes (Signed)
 EPIC Encounter for ICM Monitoring  Patient Name: Ann Bernard is a 78 y.o. female Date: 09/03/2023 Primary Care Physican: Bentley Bray, PA Primary Cardiologist: Mitzie Anda Electrophysiologist: Lawana Pray Bi-V Pacing: >99%          07/30/2023 Weight:  unknown   AT/AF Burden <1%                                                    Attempted call to son and unable to reach.   Transmission results reviewed.    CorVue thoracic impedance suggesting normal fluid levels with the exception of possible fluid accumulation from 3/23-4/8.   Prescribed:  Furosemide 20 mg take 1 tablet(s) (20 mg total) by mouth daily as needed. Potassium 20 mEq take 1 tablet(s) (20 mEq total) by mouth daily. Spironolactone 25 mg take 1 tablet by mouth daily   Labs: 06/25/2023 Creatinine 0.76, BUN 16, Potassium 4.2, Sodium 135, GFR >60  06/06/2023 Creatinine 0.91, BUN 15, Potassium 3.8, Sodium 137, GFR >60  06/04/2023 Creatinine 0.71, BUN 15, Potassium 3.8, Sodium 134, GFR >60  A complete set of results can be found in Results Review.   Recommendations:  Unable to reach.     Follow-up plan: ICM clinic phone appointment on 10/06/2023.   91 day device clinic remote transmission pending.     EP/Cardiology Office Visits:  01/15/2024 with Dr. Mitzie Anda.     Copy of ICM check sent to Dr. Lawana Pray.   3 month ICM trend: 09/01/2023.    12-14 Month ICM trend:     Almyra Jain, RN 09/03/2023 10:53 AM

## 2023-09-03 NOTE — Telephone Encounter (Signed)
 Remote ICM transmission received.  Attempted call to son regarding ICM remote transmission and no answer.

## 2023-09-29 NOTE — Progress Notes (Signed)
 Remote ICD transmission.

## 2023-10-01 ENCOUNTER — Ambulatory Visit (HOSPITAL_COMMUNITY): Payer: Self-pay

## 2023-10-06 ENCOUNTER — Ambulatory Visit: Attending: Cardiology

## 2023-10-06 DIAGNOSIS — I5022 Chronic systolic (congestive) heart failure: Secondary | ICD-10-CM | POA: Diagnosis not present

## 2023-10-06 DIAGNOSIS — Z95810 Presence of automatic (implantable) cardiac defibrillator: Secondary | ICD-10-CM

## 2023-10-07 ENCOUNTER — Telehealth: Payer: Self-pay

## 2023-10-07 NOTE — Telephone Encounter (Signed)
 Remote ICM transmission received.  Attempted call to son Ann Bernard, per [facility] regarding ICM remote transmission and person answering phone stated he was not home.

## 2023-10-07 NOTE — Progress Notes (Signed)
 EPIC Encounter for ICM Monitoring  Patient Name: Ann Bernard is a 78 y.o. female Date: 10/07/2023 Primary Care Physican: Bentley Bray, PA Primary Cardiologist: Mitzie Anda Electrophysiologist: Lawana Pray Bi-V Pacing: >99%          07/30/2023 Weight:  unknown   AT/AF Burden <1%                                                    Attempted call to son and unable to reach.   Transmission results reviewed.    CorVue thoracic impedance suggesting possible dryness starting 5/14 and trending back toward baseline.  Also suggesting possible fluid accumulation from 4/29-5/7.   Prescribed:  Furosemide  20 mg take 1 tablet(s) (20 mg total) by mouth daily as needed. Potassium 20 mEq take 1 tablet(s) (20 mEq total) by mouth daily. Spironolactone  25 mg take 1 tablet by mouth daily   Labs: 08/15/2023 Creatinine 0.79, BUN 10, Potassium 3.8, Sodium 140, GFR >60  06/25/2023 Creatinine 0.76, BUN 16, Potassium 4.2, Sodium 135, GFR >60  06/06/2023 Creatinine 0.91, BUN 15, Potassium 3.8, Sodium 137, GFR >60  06/04/2023 Creatinine 0.71, BUN 15, Potassium 3.8, Sodium 134, GFR >60  A complete set of results can be found in Results Review.   Recommendations:  Unable to reach.     Follow-up plan: ICM clinic phone appointment on 12/08/2023.   91 day device clinic remote transmission 11/21/2023.     EP/Cardiology Office Visits:  01/15/2024 with Dr. Mitzie Anda.     Copy of ICM check sent to Dr. Lawana Pray.   3 month ICM trend: 10/06/2023.    12-14 Month ICM trend:     Almyra Jain, RN 10/07/2023 11:25 AM

## 2023-10-23 ENCOUNTER — Other Ambulatory Visit (HOSPITAL_COMMUNITY): Payer: Self-pay | Admitting: Cardiology

## 2023-10-23 ENCOUNTER — Other Ambulatory Visit (HOSPITAL_COMMUNITY): Payer: Self-pay

## 2023-10-30 ENCOUNTER — Other Ambulatory Visit (HOSPITAL_COMMUNITY): Payer: Self-pay

## 2023-10-30 MED ORDER — SPIRONOLACTONE 25 MG PO TABS
25.0000 mg | ORAL_TABLET | Freq: Every day | ORAL | 1 refills | Status: DC
  Filled 2023-10-30: qty 90, 90d supply, fill #0

## 2023-11-21 ENCOUNTER — Ambulatory Visit (INDEPENDENT_AMBULATORY_CARE_PROVIDER_SITE_OTHER)

## 2023-11-21 DIAGNOSIS — I428 Other cardiomyopathies: Secondary | ICD-10-CM | POA: Diagnosis not present

## 2023-11-21 LAB — CUP PACEART REMOTE DEVICE CHECK
Battery Remaining Longevity: 36 mo
Battery Remaining Percentage: 39 %
Battery Voltage: 2.93 V
Brady Statistic AP VP Percent: 13 %
Brady Statistic AP VS Percent: 1 %
Brady Statistic AS VP Percent: 85 %
Brady Statistic AS VS Percent: 1.1 %
Brady Statistic RA Percent Paced: 12 %
Date Time Interrogation Session: 20250704020303
HighPow Impedance: 72 Ohm
Implantable Lead Connection Status: 753985
Implantable Lead Connection Status: 753985
Implantable Lead Connection Status: 753985
Implantable Lead Implant Date: 20210409
Implantable Lead Implant Date: 20210409
Implantable Lead Implant Date: 20210409
Implantable Lead Location: 753858
Implantable Lead Location: 753859
Implantable Lead Location: 753860
Implantable Pulse Generator Implant Date: 20210409
Lead Channel Impedance Value: 450 Ohm
Lead Channel Impedance Value: 550 Ohm
Lead Channel Impedance Value: 740 Ohm
Lead Channel Pacing Threshold Amplitude: 0.625 V
Lead Channel Pacing Threshold Amplitude: 0.75 V
Lead Channel Pacing Threshold Amplitude: 1.625 V
Lead Channel Pacing Threshold Pulse Width: 0.5 ms
Lead Channel Pacing Threshold Pulse Width: 0.5 ms
Lead Channel Pacing Threshold Pulse Width: 1 ms
Lead Channel Sensing Intrinsic Amplitude: 1.4 mV
Lead Channel Sensing Intrinsic Amplitude: 11.9 mV
Lead Channel Setting Pacing Amplitude: 1.625
Lead Channel Setting Pacing Amplitude: 2 V
Lead Channel Setting Pacing Amplitude: 2.125
Lead Channel Setting Pacing Pulse Width: 0.5 ms
Lead Channel Setting Pacing Pulse Width: 1 ms
Lead Channel Setting Sensing Sensitivity: 0.5 mV
Pulse Gen Serial Number: 111006673
Zone Setting Status: 755011

## 2023-11-25 ENCOUNTER — Ambulatory Visit: Payer: Self-pay | Admitting: Cardiology

## 2023-12-01 ENCOUNTER — Other Ambulatory Visit (HOSPITAL_COMMUNITY): Payer: Self-pay | Admitting: Cardiology

## 2023-12-01 ENCOUNTER — Other Ambulatory Visit (HOSPITAL_COMMUNITY): Payer: Self-pay

## 2023-12-01 MED ORDER — POTASSIUM CHLORIDE CRYS ER 20 MEQ PO TBCR
20.0000 meq | EXTENDED_RELEASE_TABLET | Freq: Every day | ORAL | 3 refills | Status: AC
  Filled 2023-12-01 – 2023-12-10 (×2): qty 60, 60d supply, fill #0
  Filled 2024-02-22: qty 60, 60d supply, fill #1
  Filled 2024-05-17: qty 60, 60d supply, fill #2

## 2023-12-08 ENCOUNTER — Ambulatory Visit: Attending: Cardiology

## 2023-12-08 DIAGNOSIS — Z95810 Presence of automatic (implantable) cardiac defibrillator: Secondary | ICD-10-CM

## 2023-12-08 DIAGNOSIS — I5022 Chronic systolic (congestive) heart failure: Secondary | ICD-10-CM | POA: Diagnosis not present

## 2023-12-09 ENCOUNTER — Other Ambulatory Visit (HOSPITAL_COMMUNITY): Payer: Self-pay

## 2023-12-09 ENCOUNTER — Other Ambulatory Visit: Payer: Self-pay

## 2023-12-09 MED ORDER — FUROSEMIDE 20 MG PO TABS
20.0000 mg | ORAL_TABLET | Freq: Every day | ORAL | 3 refills | Status: AC
  Filled 2023-12-09: qty 90, 90d supply, fill #0
  Filled 2024-04-05: qty 90, 90d supply, fill #1

## 2023-12-09 NOTE — Progress Notes (Signed)
 Spoke with son per DPR.  Advised Dr Rolan recommended patient should take Furosemide  20 mg 1 tablet daily instead of as needed.  He verbalized understanding and repeated instructions back correctly.  He would like the prescription be sent to [facility].   Also advised BMET is needed next week.  Son works and can provide transportation for mother on 8/1 at 8:30 in Dr [employer] office.  Advised if he has any questions to call back.

## 2023-12-09 NOTE — Progress Notes (Signed)
 Start regular Lasix  20 mg daily with BMET in 1 week.

## 2023-12-09 NOTE — Progress Notes (Addendum)
 EPIC Encounter for ICM Monitoring  Patient Name: Ann Bernard is a 78 y.o. female Date: 12/09/2023 Primary Care Physican: Duwayne Katheryn HERO, PA Primary Cardiologist: Rolan Electrophysiologist: Inocencio Bi-V Pacing: 97%          08/15/2023 Office Weight:  160 lbs   AT/AF Burden <1%                                                    Spoke with son Ann Bernard per DPR. Transmission results reviewed.  Pt did have some SOB when walking yesterday but could have been related to the heat since they were outside.      CorVue thoracic impedance suggesting possible fluid accumulation starting 12/04/2023.   Also suggesting possible fluid accumulation occurred from 7/6-7/11.   Prescribed:  Furosemide  20 mg take 1 tablet(s) (20 mg total) by mouth daily as needed. Potassium 20 mEq take 1 tablet(s) (20 mEq total) by mouth daily. Spironolactone  25 mg take 1 tablet by mouth daily   Labs: 08/15/2023 Creatinine 0.79, BUN 10, Potassium 3.8, Sodium 140, GFR >60  06/25/2023 Creatinine 0.76, BUN 16, Potassium 4.2, Sodium 135, GFR >60  06/06/2023 Creatinine 0.91, BUN 15, Potassium 3.8, Sodium 137, GFR >60  06/04/2023 Creatinine 0.71, BUN 15, Potassium 3.8, Sodium 134, GFR >60  A complete set of results can be found in Results Review.   Recommendations:  Advised to have patient take PRN Lasix  20 mg 1 tablet for 2 days only.  He is unsure if patient has the script at home but will check.  Advised if new script is needed the pharmacy can contact Dr Keneth office for renewal.     Follow-up plan: ICM clinic phone appointment on 12/17/2023 to recheck fluid levels.   91 day device clinic remote transmission 02/20/2024.     EP/Cardiology Office Visits:  01/20/2024 with Dr Inocencio.  01/15/2024 with Dr. Rolan.  Copy sent to Dr Rolan as RICK for plan to take PRN Furosemide  x 2 days.   Copy of ICM check sent to Dr. Inocencio.   3 month ICM trend: 12/08/2023.    12-14 Month ICM trend:     Ann GORMAN Garner,  RN 12/09/2023 10:57 AM

## 2023-12-09 NOTE — Addendum Note (Signed)
 Addended by: ARMANDA MITZIE RAMAN on: 12/09/2023 02:02 PM   Modules accepted: Orders

## 2023-12-10 ENCOUNTER — Other Ambulatory Visit (HOSPITAL_COMMUNITY): Payer: Self-pay

## 2023-12-11 ENCOUNTER — Other Ambulatory Visit: Payer: Self-pay

## 2023-12-17 ENCOUNTER — Ambulatory Visit: Attending: Cardiology

## 2023-12-17 DIAGNOSIS — I5022 Chronic systolic (congestive) heart failure: Secondary | ICD-10-CM

## 2023-12-17 DIAGNOSIS — Z95810 Presence of automatic (implantable) cardiac defibrillator: Secondary | ICD-10-CM

## 2023-12-17 NOTE — Progress Notes (Unsigned)
 EPIC Encounter for ICM Monitoring  Patient Name: Ann Bernard is a 78 y.o. female Date: 12/17/2023 Primary Care Physican: Duwayne Katheryn HERO, PA Primary Cardiologist: Rolan Electrophysiologist: Inocencio Bi-V Pacing: 97%          08/15/2023 Office Weight:  160 lbs   AT/AF Burden <1%       NSVT 4                                              Spoke with son Ann Bernard per DPR.   Transmission results reviewed.   He reports patient is feeling fine since taking Lasix  20 mg daily.  No dizziness noted.     CorVue thoracic impedance suggesting returned to normal on 7/22 after Lasix  changed from PRN to daily but suggesting dryness starting 7/28.   Prescribed:  Furosemide  20 mg take 1 tablet(s) (20 mg total) by mouth daily. Potassium 20 mEq take 1 tablet(s) (20 mEq total) by mouth daily. Spironolactone  25 mg take 1 tablet by mouth daily   Labs: 12/19/2023 BMET Scheduled at HF clinic following increase in Furosemide  7/22 08/15/2023 Creatinine 0.79, BUN 10, Potassium 3.8, Sodium 140, GFR >60  06/25/2023 Creatinine 0.76, BUN 16, Potassium 4.2, Sodium 135, GFR >60  06/06/2023 Creatinine 0.91, BUN 15, Potassium 3.8, Sodium 137, GFR >60  06/04/2023 Creatinine 0.71, BUN 15, Potassium 3.8, Sodium 134, GFR >60  A complete set of results can be found in Results Review.   Recommendations:  Reminded to have labs drawn tomorrow morning at HF clinic.  Will recheck fluid levels 8/6.   Follow-up plan: ICM clinic phone appointment on 12/24/2023 to recheck fluid levels.   91 day device clinic remote transmission 02/20/2024.     EP/Cardiology Office Visits:  01/20/2024 with Dr Inocencio.  01/15/2024 with Dr. Rolan.     Copy of ICM check sent to Dr. Inocencio.    3 month ICM trend: 12/17/2023.    12-14 Month ICM trend:     Ann GORMAN Garner, RN 12/17/2023 7:30 AM

## 2023-12-19 ENCOUNTER — Ambulatory Visit (HOSPITAL_COMMUNITY)
Admission: RE | Admit: 2023-12-19 | Discharge: 2023-12-19 | Disposition: A | Discharge status: Home / Self Care | Source: Ambulatory Visit | Attending: Cardiology | Admitting: Cardiology

## 2023-12-19 ENCOUNTER — Ambulatory Visit (HOSPITAL_COMMUNITY): Payer: Self-pay | Admitting: Cardiology

## 2023-12-19 DIAGNOSIS — I5022 Chronic systolic (congestive) heart failure: Secondary | ICD-10-CM | POA: Insufficient documentation

## 2023-12-19 DIAGNOSIS — Z95810 Presence of automatic (implantable) cardiac defibrillator: Secondary | ICD-10-CM | POA: Diagnosis not present

## 2023-12-19 LAB — BASIC METABOLIC PANEL WITH GFR
Anion gap: 9 (ref 5–15)
BUN: 17 mg/dL (ref 8–23)
CO2: 25 mmol/L (ref 22–32)
Calcium: 9.1 mg/dL (ref 8.9–10.3)
Chloride: 103 mmol/L (ref 98–111)
Creatinine, Ser: 0.8 mg/dL (ref 0.44–1.00)
GFR, Estimated: >60 mL/min (ref >60)
Glucose, Bld: 103 mg/dL — ABNORMAL HIGH (ref 70–99)
Potassium: 3.9 mmol/L (ref 3.5–5.1)
Sodium: 137 mmol/L (ref 135–145)

## 2023-12-24 ENCOUNTER — Ambulatory Visit: Attending: Cardiology

## 2023-12-24 DIAGNOSIS — I5022 Chronic systolic (congestive) heart failure: Secondary | ICD-10-CM

## 2023-12-24 DIAGNOSIS — Z95810 Presence of automatic (implantable) cardiac defibrillator: Secondary | ICD-10-CM

## 2023-12-24 NOTE — Progress Notes (Signed)
 EPIC Encounter for ICM Monitoring  Patient Name: Ann Bernard is a 78 y.o. female Date: 12/24/2023 Primary Care Physican: Duwayne Katheryn HERO, PA Primary Cardiologist: Rolan Electrophysiologist: Inocencio Bi-V Pacing: 97%          08/15/2023 Office Weight:  160 lbs   AT/AF Burden <1%       NSVT 4                                              Spoke with with son Kyndall Amero per [facility] and heart failure questions reviewed.  Transmission results reviewed.  He reports pt is feeling well at this time and voices no complaints.     CorVue thoracic impedance suggesting returned to normal on 8/5.   Prescribed:  Furosemide  20 mg take 1 tablet(s) (20 mg total) by mouth daily. Potassium 20 mEq take 1 tablet(s) (20 mEq total) by mouth daily. Spironolactone  25 mg take 1 tablet by mouth daily   Labs: 12/19/2023 Creatinine 0.60, BUN 17, Potassium 3.9, Sodium 137, GFR >60 08/15/2023 Creatinine 0.79, BUN 10, Potassium 3.8, Sodium 140, GFR >60  06/25/2023 Creatinine 0.76, BUN 16, Potassium 4.2, Sodium 135, GFR >60  06/06/2023 Creatinine 0.91, BUN 15, Potassium 3.8, Sodium 137, GFR >60  06/04/2023 Creatinine 0.71, BUN 15, Potassium 3.8, Sodium 134, GFR >60  A complete set of results can be found in Results Review.   Recommendations: No changes and encouraged to call if experiencing any fluid symptoms.     Follow-up plan: ICM clinic phone appointment on 01/26/2024.   91 day device clinic remote transmission 02/20/2024.     EP/Cardiology Office Visits:  01/20/2024 with Dr Inocencio.  01/15/2024 with Dr. Rolan.     Copy of ICM check sent to Dr. Inocencio.    3 month ICM trend: 12/24/2023.    12-14 Month ICM trend:     [name], RN 12/24/2023 7:06 AM

## 2024-01-05 NOTE — Progress Notes (Signed)
 ADVANCED HF CLINIC NOTE   PCP: Duwayne Katheryn HERO, PA HF Cardiology: Dr. Rolan  Chief complaint: CHF  Ann Bernard is a 78 y.o. with history of HTN, hyperlipidemia, tobacco abuse, and chronic systolic heart failure. Of note, her brother has heart failure and has an ICD.    Admitted to [facility] in 10/20 with acute systolic heart failure. CTA negative for PE. ECHO was completed and showed severely reduced EF. Diagnostic cath was performed as noted below, mild coronary disease not likely to have caused cardiomyopathy.  Diuresed with IV lasix  and transitioned to lasix  20 mg daily. Started on digoxin , carvedilol , and Entresto . Discharge weight 174 pounds.      Echo in 2/21 showed EF 20-25% with septal-lateral dyssynchrony.  Patient had St Jude CRT-D device placed.   Echo in 7/21 showed EF up to 50% with mild LVH, normal RV.  Echo (7/23) showed EF 50%, RV normal, mild MR.   Echo in 7/23 with stable EF 50%.  Seen recently in the ED x 2 on 11/20/23 and again on 11/24/23, both times for atypical chest pain. W/u unremarkable. Hs trops negative. D-dimer elevated on 7/7 however chest CT was negative for PNA. Etiology felt to be GI. Suspected GERD. Was given Rx for Pepcid .   ER with atypical chest pain in 2/25, troponin negative.   Echo 4/25: EF 45-50%, GIDD, LV with GHK, RV mildly reduced, mild MR  Today she returns for AHF follow up. Overall feeling ***. Denies palpitations, CP, dizziness, edema, or PND/Orthopnea. *** SOB. Appetite ok. No fever or chills. Weight at home *** pounds. Taking all medications. Denies ETOH, tobacco or drug use.   ECG (personally reviewed): NSR, BiV pacing.   Device interrogation: Stable thoracic impedance. >99% BiV pacing. No AF, 1 run NSVT.  ***  PMH: 1. Chronic systolic CHF: Nonischemic cardiomyopathy.  - Echo (10/20): EF < 20%, moderately dilated LV, severe global HK.  - RHC/LHC (10/20): D1 and D2 with 50% ostial stenosis.  PCWP 23, CI 2.34.  - CMRI (10/20): Moderately  dilated LV with EF 13%, diffuse hypokinesis, septal-lateral dyssynchrony, normal RV size with EF 30%, nonspecific RV insertion site LGE.   - Echo (2/21): EF 20-25%, septal-lateral dyssynchrony, mild AI, mild-moderately decreased RV systolic function.  - St Jude CRT-D device placed.  - Echo (7/21): EF 50%, mild LVH, normal RV - Echo (7/23): EF 50%, RV normal, mild MR - Echo 4/25: EF 45-50%, GIDD, LV with GHK, RV mildly reduced, mild MR 2. Active smoker.  3. Hyperlipidemia 4. HTN 5. ABIs (2/21): Normal.   ROS: All systems negative except as listed in HPI, PMH and Problem List.  \SH:  Social History   Socioeconomic History   Marital status: Widowed    Spouse name: Not on file   Number of children: Not on file   Years of education: Not on file   Highest education level: Not on file  Occupational History   Not on file  Tobacco Use   Smoking status: Former    Types: Cigarettes   Smokeless tobacco: Never  Vaping Use   Vaping status: Never Used  Substance and Sexual Activity   Alcohol use: No   Drug use: Never   Sexual activity: Not Currently  Other Topics Concern   Not on file  Social History Narrative   Not on file   Social Drivers of Health   Financial Resource Strain: Low Risk  (08/05/2019)   Overall Financial Resource Strain (CARDIA)  Difficulty of Paying Living Expenses: Not very hard  Food Insecurity: No Food Insecurity (08/05/2019)   Hunger Vital Sign    Worried About Running Out of Food in the Last Year: Never true    Ran Out of Food in the Last Year: Never true  Transportation Needs: No Transportation Needs (08/05/2019)   PRAPARE - Administrator, Civil Service (Medical): No    Lack of Transportation (Non-Medical): No  Physical Activity: Unknown (03/15/2019)   Exercise Vital Sign    Days of Exercise per Week: 0 days    Minutes of Exercise per Session: Not on file  Stress: No Stress Concern Present (03/15/2019)   [employer] of Occupational  Health - Occupational Stress Questionnaire    Feeling of Stress : Not at all  Social Connections: Moderately Isolated (03/15/2019)   Social Connection and Isolation Panel    Frequency of Communication with Friends and Family: More than three times a week    Frequency of Social Gatherings with Friends and Family: Once a week    Attends Religious Services: More than 4 times per year    Active Member of [employer] or Organizations: No    Attends Banker Meetings: Never    Marital Status: Widowed  Intimate Partner Violence: Not At Risk (03/15/2019)   Humiliation, Afraid, Rape, and Kick questionnaire    Fear of Current or Ex-Partner: No    Emotionally Abused: No    Physically Abused: No    Sexually Abused: No    FH:  Family History  Problem Relation Age of Onset   Hypertension Mother    Diabetes Mother    Diabetes Brother    Alcoholism Father    Diabetes type I Grandson     Current Outpatient Medications  Medication Sig Dispense Refill   alendronate (FOSAMAX) 70 MG tablet Take 70 mg by mouth once a week. Every Monday     atorvastatin  (LIPITOR) 40 MG tablet TAKE 1 TABLET BY MOUTH ONCE A DAY AT 6PM 90 tablet 3   carvedilol  (COREG ) 12.5 MG tablet Take 12.5 mg by mouth 2 (two) times daily with a meal.     cyclobenzaprine  (FLEXERIL ) 10 MG tablet Take 1 tablet (10 mg total) by mouth 2 (two) times daily as needed for muscle spasms. 20 tablet 0   dapagliflozin  propanediol (FARXIGA ) 10 MG TABS tablet Take 1 tablet (10 mg total) by mouth daily before breakfast. 90 tablet 3   diphenhydrAMINE (SOMINEX) 25 MG tablet Take 1 tablet by mouth daily as needed for itching.     famotidine  (PEPCID ) 20 MG tablet Take 20 mg by mouth 2 (two) times daily.     furosemide  (LASIX ) 20 MG tablet Take 1 tablet (20 mg total) by mouth daily. 90 tablet 3   Multiple Vitamins-Minerals (PRESERVISION AREDS 2 PO) Take 1 capsule by mouth daily.     potassium chloride  SA (KLOR-CON  M) 20 MEQ tablet Take 1 tablet (20  mEq total) by mouth daily. 60 tablet 3   sacubitril -valsartan  (ENTRESTO ) 24-26 MG Take 1 tablet by mouth 2 (two) times daily. 60 tablet 3   spironolactone  (ALDACTONE ) 25 MG tablet Take 1 tablet (25 mg total) by mouth daily. 90 tablet 1   No current facility-administered medications for this visit.    There were no vitals filed for this visit.   Wt Readings from Last 3 Encounters:  08/15/23 72.8 kg (160 lb 6.4 oz)  06/25/23 70.3 kg (155 lb)  06/06/23 70.3 kg (155 lb)  PHYSICAL EXAM: General:  *** appearing.  No respiratory difficulty Neck: JVD *** cm.  Cor: Regular rate & rhythm. No murmurs. Lungs: clear Extremities: no edema  Neuro: alert & oriented x 3. Affect pleasant.   ASSESSMENT & PLAN: 1. Chronic Systolic CHF: Nonischemic cardiomyopathy. Echo 10/20 with EF < 20%.  LHC/RHC 10/20 showed nonobstructive coronary disease, CI 2.3.  Cardiac MRI (10/20) showed EF 13%, moderate LV dilation, septal-lateral dyssynchrony c/w LBBB and diffuse severe hypokinesis, normal RV size with moderate systolic dysfunction EF 30%, mild-moderate MR, moderate AI, LGE at inferior RV insertion site (nonspecific). Echo in 2/21 showed that EF remained 20-25%. She has a wide LBBB => St Jude CRT-D device placed. 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  Echo in 7/21 showed improvement in EF up to 50%, Echo in 7/23 with stable EF 50%.  Echo 4/25: EF 45-50%, GIDD, LV with GHK, RV mildly reduced, mild MR - NYHA class I-II, not volume overloaded by exam or Corvue. *** now on lasix  20 mg daily  - Continue Coreg  18.75, increase to bid (only taking daily).   - Only taking Entresto  once daily, increase Entresto  to 24/26 bid.  - Continue spironolactone  25 mg daily.  - Continue Farxiga  10 mg daily.  - Stop hydralazine  after increasing Coreg  and Entresto  to bid. *** - BMET today.    2. CAD: Nonobstructive CAD. No chest pain.  - Continue atorvastatin   Follow up 4 months with APP. ***   Beckey LITTIE Coe,  01/05/2024

## 2024-01-14 ENCOUNTER — Telehealth (HOSPITAL_COMMUNITY): Payer: Self-pay

## 2024-01-14 NOTE — Telephone Encounter (Signed)
 Called to confirm/remind patient of their appointment at the [facility] on 01/15/24.   Appointment:   [] Confirmed  [] Left mess   [] No answer/No voice mail  [x] VM Full/unable to leave message  [] Phone not in service

## 2024-01-15 ENCOUNTER — Encounter (HOSPITAL_COMMUNITY): Payer: Self-pay

## 2024-01-15 ENCOUNTER — Other Ambulatory Visit (HOSPITAL_COMMUNITY): Payer: Self-pay

## 2024-01-15 ENCOUNTER — Other Ambulatory Visit: Payer: Self-pay

## 2024-01-15 ENCOUNTER — Ambulatory Visit (HOSPITAL_COMMUNITY): Payer: Self-pay | Admitting: Internal Medicine

## 2024-01-15 ENCOUNTER — Ambulatory Visit (HOSPITAL_COMMUNITY)
Admission: RE | Admit: 2024-01-15 | Discharge: 2024-01-15 | Disposition: A | Discharge status: Home / Self Care | Source: Ambulatory Visit | Attending: Internal Medicine | Admitting: Internal Medicine

## 2024-01-15 VITALS — BP 118/74 | HR 74 | Ht 67.0 in | Wt 169.8 lb

## 2024-01-15 DIAGNOSIS — I2510 Atherosclerotic heart disease of native coronary artery without angina pectoris: Secondary | ICD-10-CM | POA: Insufficient documentation

## 2024-01-15 DIAGNOSIS — I447 Left bundle-branch block, unspecified: Secondary | ICD-10-CM | POA: Insufficient documentation

## 2024-01-15 DIAGNOSIS — Z79899 Other long term (current) drug therapy: Secondary | ICD-10-CM | POA: Insufficient documentation

## 2024-01-15 DIAGNOSIS — I428 Other cardiomyopathies: Secondary | ICD-10-CM | POA: Insufficient documentation

## 2024-01-15 DIAGNOSIS — I5022 Chronic systolic (congestive) heart failure: Secondary | ICD-10-CM | POA: Insufficient documentation

## 2024-01-15 DIAGNOSIS — R21 Rash and other nonspecific skin eruption: Secondary | ICD-10-CM | POA: Insufficient documentation

## 2024-01-15 DIAGNOSIS — I110 Hypertensive heart disease with heart failure: Secondary | ICD-10-CM | POA: Diagnosis not present

## 2024-01-15 DIAGNOSIS — Z87891 Personal history of nicotine dependence: Secondary | ICD-10-CM | POA: Insufficient documentation

## 2024-01-15 DIAGNOSIS — Z7984 Long term (current) use of oral hypoglycemic drugs: Secondary | ICD-10-CM | POA: Diagnosis not present

## 2024-01-15 LAB — BASIC METABOLIC PANEL WITH GFR
Anion gap: 9 (ref 5–15)
BUN: 16 mg/dL (ref 8–23)
CO2: 27 mmol/L (ref 22–32)
Calcium: 9.5 mg/dL (ref 8.9–10.3)
Chloride: 104 mmol/L (ref 98–111)
Creatinine, Ser: 0.85 mg/dL (ref 0.44–1.00)
GFR, Estimated: >60 mL/min (ref >60)
Glucose, Bld: 102 mg/dL — ABNORMAL HIGH (ref 70–99)
Potassium: 4 mmol/L (ref 3.5–5.1)
Sodium: 140 mmol/L (ref 135–145)

## 2024-01-15 LAB — BRAIN NATRIURETIC PEPTIDE: B Natriuretic Peptide: 25.3 pg/mL (ref 0.0–100.0)

## 2024-01-15 MED ORDER — SPIRONOLACTONE 25 MG PO TABS
25.0000 mg | ORAL_TABLET | Freq: Every day | ORAL | 3 refills | Status: AC
  Filled 2024-01-15: qty 90, 90d supply, fill #0
  Filled 2024-06-07: qty 90, 90d supply, fill #1

## 2024-01-15 MED ORDER — ATORVASTATIN CALCIUM 40 MG PO TABS
40.0000 mg | ORAL_TABLET | Freq: Every evening | ORAL | 3 refills | Status: AC
  Filled 2024-01-15: qty 90, 90d supply, fill #0
  Filled 2024-05-31: qty 90, 90d supply, fill #1

## 2024-01-15 MED ORDER — CARVEDILOL 12.5 MG PO TABS
12.5000 mg | ORAL_TABLET | Freq: Two times a day (BID) | ORAL | 3 refills | Status: AC
  Filled 2024-01-15: qty 45, 23d supply, fill #0

## 2024-01-15 NOTE — Patient Instructions (Addendum)
 Thank you for coming in today  If you had labs drawn today, any labs that are abnormal the clinic will call you No news is good news  Medications: No changes  Follow up appointments:  Your physician recommends that you schedule a follow-up appointment in:  4 months With Dr. Rolan Please call our office to schedule the follow-up appointment in October 2025 for December 2025.   Do the following things EVERYDAY: Weigh yourself in the morning before breakfast. Write it down and keep it in a log. Take your medicines as prescribed Eat low salt foods--Limit salt (sodium) to 2000 mg per day.  Stay as active as you can everyday Limit all fluids for the day to less than 2 liters   At the [facility], you and your health needs are our priority. As part of our continuing mission to provide you with exceptional heart care, we have created designated Provider Care Teams. These Care Teams include your primary Cardiologist (physician) and Advanced Practice Providers (APPs- Physician Assistants and Nurse Practitioners) who all work together to provide you with the care you need, when you need it.   You may see any of the following providers on your designated Care Team at your next follow up: Dr Toribio Fuel Dr Ezra Rolan Dr. Ria Gardenia Greig Lenetta, NP Caffie Shed, [place] [facility], [place] Beckey Coe, NP Tinnie Redman, PharmD   Please be sure to bring in all your medications bottles to every appointment.    Thank you for choosing [facility]  If you have any questions or concerns before your next appointment please send us  a message through [facility] or call our office at [phone].    TO LEAVE A MESSAGE FOR THE NURSE SELECT OPTION 2, PLEASE LEAVE A MESSAGE INCLUDING: YOUR NAME DATE OF BIRTH CALL BACK NUMBER REASON FOR CALL**this is important as we prioritize the call backs  YOU WILL RECEIVE A  CALL BACK THE SAME DAY AS LONG AS YOU CALL BEFORE 4:00 PM

## 2024-01-19 NOTE — Progress Notes (Deleted)
  Electrophysiology Office Note:   Date:  01/19/2024  ID:  Carle Dargan, DOB [birth year], MRN [id number]  Primary Cardiologist: None Primary Heart Failure: None Electrophysiologist: Breyah Akhter Gladis Norton, MD  {Click to update primary MD,subspecialty MD or APP then REFRESH:1}    History of Present Illness:   Dayleen Beske is a 78 y.o. female with h/o hypertension, hyperlipidemia, tobacco abuse, chronic systolic heart failure seen today for routine electrophysiology followup.   Since last being seen in our clinic the patient reports doing ***.  she denies chest pain, palpitations, dyspnea, PND, orthopnea, nausea, vomiting, dizziness, syncope, edema, weight gain, or early satiety.   Review of systems complete and found to be negative unless listed in HPI.      EP Information / Studies Reviewed:    {EKGtoday:28818}      ICD Interrogation-  reviewed in detail today,  See PACEART report.  Device History: Abbott BiV ICD implanted 08/27/2019 for chronic systolic heart failure History of appropriate therapy: No History of AAD therapy: No   Risk Assessment/Calculations:   {Does this patient have ATRIAL FIBRILLATION?[phone]} No BP recorded.  {Refresh Note OR Click here to enter BP  :1}***        Physical Exam:   VS:  There were no vitals taken for this visit.   Wt Readings from Last 3 Encounters:  01/15/24 169 lb 12.8 oz (77 kg)  08/15/23 160 lb 6.4 oz (72.8 kg)  06/25/23 155 lb (70.3 kg)     GEN: Well nourished, well developed in no acute distress NECK: No JVD; No carotid bruits CARDIAC: {EPRHYTHM:28826}, no murmurs, rubs, gallops RESPIRATORY:  Clear to auscultation without rales, wheezing or rhonchi  ABDOMEN: Soft, non-tender, non-distended EXTREMITIES:  No edema; No deformity   ASSESSMENT AND PLAN:    Chronic systolic dysfunction s/p Abbott CRT-D  euvolemic today Stable on an appropriate medical regimen Normal ICD function See Pace Art report No changes today  2.   Nonobstructive coronary artery disease: No current chest pain.  Continue atorvastatin .  3.  Tobacco abuse: Has quit smoking Disposition:   Follow up with {EPPROVIDERS:28135::EP Team} {EPFOLLOW LE:71826}   Signed, Sabena Winner Gladis Norton, MD

## 2024-01-20 ENCOUNTER — Ambulatory Visit: Attending: Cardiology | Admitting: Cardiology

## 2024-01-22 ENCOUNTER — Encounter: Payer: Self-pay | Admitting: Internal Medicine

## 2024-01-26 ENCOUNTER — Encounter

## 2024-02-02 ENCOUNTER — Ambulatory Visit: Attending: Cardiology

## 2024-02-02 ENCOUNTER — Other Ambulatory Visit (HOSPITAL_COMMUNITY): Payer: Self-pay

## 2024-02-02 ENCOUNTER — Other Ambulatory Visit: Payer: Self-pay | Admitting: Student

## 2024-02-02 DIAGNOSIS — I5022 Chronic systolic (congestive) heart failure: Secondary | ICD-10-CM

## 2024-02-02 DIAGNOSIS — Z95810 Presence of automatic (implantable) cardiac defibrillator: Secondary | ICD-10-CM

## 2024-02-02 MED ORDER — SACUBITRIL-VALSARTAN 24-26 MG PO TABS
1.0000 | ORAL_TABLET | Freq: Two times a day (BID) | ORAL | 3 refills | Status: AC
  Filled 2024-02-02 (×2): qty 60, 30d supply, fill #0
  Filled 2024-04-12: qty 60, 30d supply, fill #1

## 2024-02-02 NOTE — Progress Notes (Signed)
 EPIC Encounter for ICM Monitoring  Patient Name: Ann Bernard is a 78 y.o. female Date: 02/02/2024 Primary Care Physican: Duwayne Katheryn HERO, PA Primary Cardiologist: Rolan Electrophysiologist: Inocencio Bi-V Pacing: 95%          08/15/2023 Office Weight:  160 lbs Does not weigh at home   AT/AF Burden <1%       NSVT 1                                              Spoke with with patient and son Ann Bernard per [facility] and heart failure questions reviewed.  Transmission results reviewed.  She reports feeling well and no fluid symptoms.  She ate restaurant foods over the weekend which may have contributed to start of fluid accumulation.     CorVue thoracic impedance suggesting possible fluid accumulation starting 9/14.   Prescribed:  Furosemide  20 mg take 1 tablet(s) (20 mg total) by mouth daily. Potassium 20 mEq take 1 tablet(s) (20 mEq total) by mouth daily. Spironolactone  25 mg take 1 tablet by mouth daily   Labs: 01/15/2024 Creatinine 0.85, BUN 16, Potassium 4.0, Sodium 140, GFR <60 12/19/2023 Creatinine 0.60, BUN 17, Potassium 3.9, Sodium 137, GFR >60 08/15/2023 Creatinine 0.79, BUN 10, Potassium 3.8, Sodium 140, GFR >60  06/25/2023 Creatinine 0.76, BUN 16, Potassium 4.2, Sodium 135, GFR >60  06/06/2023 Creatinine 0.91, BUN 15, Potassium 3.8, Sodium 137, GFR >60  06/04/2023 Creatinine 0.71, BUN 15, Potassium 3.8, Sodium 134, GFR >60  A complete set of results can be found in Results Review.   Recommendations:  Advised to take 1 extra Furosemide  tomorrow morning and then return to 1 tablet daily.     Follow-up plan: ICM clinic phone appointment on 03/08/2024.   91 day device clinic remote transmission 02/20/2024.     EP/Cardiology Office Visits:  02/26/2024 with Dr Inocencio.  Recall 05/14/2024 with Dr. Rolan.     Copy of ICM check sent to Dr. Inocencio.    3 month ICM trend: 02/02/2024.    12-14 Month ICM trend:     Ann GORMAN Garner, RN 02/02/2024 4:18 PM

## 2024-02-16 NOTE — Progress Notes (Signed)
 Remote ICD Transmission

## 2024-02-16 NOTE — Addendum Note (Signed)
 Addended by: VICCI SELLER A on: 02/16/2024 03:43 PM   Modules accepted: Orders

## 2024-02-20 ENCOUNTER — Ambulatory Visit (INDEPENDENT_AMBULATORY_CARE_PROVIDER_SITE_OTHER)

## 2024-02-20 DIAGNOSIS — I5022 Chronic systolic (congestive) heart failure: Secondary | ICD-10-CM | POA: Diagnosis not present

## 2024-02-23 ENCOUNTER — Other Ambulatory Visit (HOSPITAL_COMMUNITY): Payer: Self-pay

## 2024-02-23 LAB — CUP PACEART REMOTE DEVICE CHECK
Battery Remaining Longevity: 32 mo
Battery Remaining Percentage: 36 %
Battery Voltage: 2.93 V
Brady Statistic AP VP Percent: 12 %
Brady Statistic AP VS Percent: 1 %
Brady Statistic AS VP Percent: 82 %
Brady Statistic AS VS Percent: 2.9 %
Brady Statistic RA Percent Paced: 11 %
Date Time Interrogation Session: 20251003021511
HighPow Impedance: 70 Ohm
Implantable Lead Connection Status: 753985
Implantable Lead Connection Status: 753985
Implantable Lead Connection Status: 753985
Implantable Lead Implant Date: 20210409
Implantable Lead Implant Date: 20210409
Implantable Lead Implant Date: 20210409
Implantable Lead Location: 753858
Implantable Lead Location: 753859
Implantable Lead Location: 753860
Implantable Pulse Generator Implant Date: 20210409
Lead Channel Impedance Value: 480 Ohm
Lead Channel Impedance Value: 610 Ohm
Lead Channel Impedance Value: 740 Ohm
Lead Channel Pacing Threshold Amplitude: 0.5 V
Lead Channel Pacing Threshold Amplitude: 0.75 V
Lead Channel Pacing Threshold Amplitude: 1.5 V
Lead Channel Pacing Threshold Pulse Width: 0.5 ms
Lead Channel Pacing Threshold Pulse Width: 0.5 ms
Lead Channel Pacing Threshold Pulse Width: 1 ms
Lead Channel Sensing Intrinsic Amplitude: 1.2 mV
Lead Channel Sensing Intrinsic Amplitude: 1.3 mV
Lead Channel Setting Pacing Amplitude: 1.5 V
Lead Channel Setting Pacing Amplitude: 2 V
Lead Channel Setting Pacing Amplitude: 2 V
Lead Channel Setting Pacing Pulse Width: 0.5 ms
Lead Channel Setting Pacing Pulse Width: 1 ms
Lead Channel Setting Sensing Sensitivity: 0.5 mV
Pulse Gen Serial Number: 111006673
Zone Setting Status: 755011

## 2024-02-24 NOTE — Progress Notes (Signed)
 Remote ICD Transmission

## 2024-02-25 NOTE — Progress Notes (Unsigned)
  Electrophysiology Office Note:   Date:  02/26/2024  ID:  Ann Bernard, DOB 01/21/1946, MRN [id number]  Primary Cardiologist: None Primary Heart Failure: Ezra Shuck, MD Electrophysiologist: Jd Mccaster Gladis Norton, MD      History of Present Illness:   Ann Bernard is a 78 y.o. female with h/o chronic systolic heart failure, hypertension, hyperlipidemia, tobacco abuse seen today for routine electrophysiology followup.   Since last being seen in our clinic the patient reports doing well.  She has no acute complaints.  She is able to do her daily activities without restriction.  she denies chest pain, palpitations, dyspnea, PND, orthopnea, nausea, vomiting, dizziness, syncope, edema, weight gain, or early satiety.   Review of systems complete and found to be negative unless listed in HPI.      EP Information / Studies Reviewed:    EKG is ordered today. Personal review as below.  EKG Interpretation Date/Time:  Thursday February 26 2024 09:26:52 EDT Ventricular Rate:  66 PR Interval:  146 QRS Duration:  130 QT Interval:  428 QTC Calculation: 448 R Axis:   152  Text Interpretation: Atrial-sensed ventricular-paced rhythm with occasional AV dual-paced complexes and Fusion complexes Biventricular pacemaker detected When compared with ECG of 15-Aug-2023 10:37, Fusion complexes are now Present Vent. rate has decreased BY   7 BPM Confirmed by Jermaine Tholl ([id number]) on 02/26/2024 9:35:47 AM   ICD Interrogation-  reviewed in detail today,  See PACEART report.  Device History: Abbott BiV ICD implanted 08/27/2019 for systolic heart failure History of appropriate therapy: No History of AAD therapy: No   Risk Assessment/Calculations:           Physical Exam:   VS:  BP 110/70 (BP Location: Right Arm, Patient Position: Sitting, Cuff Size: Normal)   Pulse 66   Ht 5' 7 (1.702 m)   Wt 170 lb (77.1 kg)   SpO2 98%   BMI 26.63 kg/m    Wt Readings from Last 3 Encounters:  02/26/24 170 lb (77.1  kg)  01/15/24 169 lb 12.8 oz (77 kg)  08/15/23 160 lb 6.4 oz (72.8 kg)     GEN: Well nourished, well developed in no acute distress NECK: No JVD; No carotid bruits CARDIAC: Regular rate and rhythm, no murmurs, rubs, gallops RESPIRATORY:  Clear to auscultation without rales, wheezing or rhonchi  ABDOMEN: Soft, non-tender, non-distended EXTREMITIES:  No edema; No deformity   ASSESSMENT AND PLAN:    Chronic systolic dysfunction s/p Abbott CRT-D  euvolemic today Stable on an appropriate medical regimen Normal ICD function See Pace Art report No changes today  2.  Nonobstructive coronary artery disease: No current chest pain.  Continue atorvastatin .  3.  Tobacco abuse: Pleat cessation encouraged  Disposition:   Follow up with EP APP in 12 months   Signed, Shylo Zamor Gladis Norton, MD

## 2024-02-26 ENCOUNTER — Other Ambulatory Visit (HOSPITAL_COMMUNITY): Payer: Self-pay

## 2024-02-26 ENCOUNTER — Ambulatory Visit: Attending: Cardiology | Admitting: Cardiology

## 2024-02-26 ENCOUNTER — Encounter: Payer: Self-pay | Admitting: Cardiology

## 2024-02-26 VITALS — BP 110/70 | HR 66 | Ht 67.0 in | Wt 170.0 lb

## 2024-02-26 DIAGNOSIS — I428 Other cardiomyopathies: Secondary | ICD-10-CM | POA: Diagnosis not present

## 2024-02-26 DIAGNOSIS — I5022 Chronic systolic (congestive) heart failure: Secondary | ICD-10-CM

## 2024-02-26 DIAGNOSIS — Z95810 Presence of automatic (implantable) cardiac defibrillator: Secondary | ICD-10-CM | POA: Diagnosis not present

## 2024-02-26 LAB — CUP PACEART INCLINIC DEVICE CHECK
Battery Remaining Longevity: 32 mo
Brady Statistic RA Percent Paced: 11 %
Brady Statistic RV Percent Paced: 94 %
Date Time Interrogation Session: 20251009110211
HighPow Impedance: 72 Ohm
Implantable Lead Connection Status: 753985
Implantable Lead Connection Status: 753985
Implantable Lead Connection Status: 753985
Implantable Lead Implant Date: 20210409
Implantable Lead Implant Date: 20210409
Implantable Lead Implant Date: 20210409
Implantable Lead Location: 753858
Implantable Lead Location: 753859
Implantable Lead Location: 753860
Implantable Pulse Generator Implant Date: 20210409
Lead Channel Impedance Value: 462.5 Ohm
Lead Channel Impedance Value: 562.5 Ohm
Lead Channel Impedance Value: 787.5 Ohm
Lead Channel Pacing Threshold Amplitude: 0.5 V
Lead Channel Pacing Threshold Amplitude: 0.5 V
Lead Channel Pacing Threshold Amplitude: 0.625 V
Lead Channel Pacing Threshold Amplitude: 1.5 V
Lead Channel Pacing Threshold Pulse Width: 0.5 ms
Lead Channel Pacing Threshold Pulse Width: 0.5 ms
Lead Channel Pacing Threshold Pulse Width: 0.5 ms
Lead Channel Pacing Threshold Pulse Width: 1 ms
Lead Channel Sensing Intrinsic Amplitude: 11.9 mV
Lead Channel Sensing Intrinsic Amplitude: 2.6 mV
Lead Channel Setting Pacing Amplitude: 1.625
Lead Channel Setting Pacing Amplitude: 2 V
Lead Channel Setting Pacing Amplitude: 2 V
Lead Channel Setting Pacing Pulse Width: 0.5 ms
Lead Channel Setting Pacing Pulse Width: 1 ms
Lead Channel Setting Sensing Sensitivity: 0.5 mV
Pulse Gen Serial Number: 111006673
Zone Setting Status: 755011

## 2024-02-27 ENCOUNTER — Ambulatory Visit: Payer: Self-pay | Admitting: Cardiology

## 2024-03-08 ENCOUNTER — Ambulatory Visit: Attending: Cardiology

## 2024-03-08 DIAGNOSIS — I5022 Chronic systolic (congestive) heart failure: Secondary | ICD-10-CM | POA: Diagnosis not present

## 2024-03-08 DIAGNOSIS — Z95810 Presence of automatic (implantable) cardiac defibrillator: Secondary | ICD-10-CM | POA: Diagnosis not present

## 2024-03-10 ENCOUNTER — Telehealth: Payer: Self-pay

## 2024-03-10 NOTE — Telephone Encounter (Signed)
 Notification received from [facility] nurse that there was a noted decrease in BIV pacing since last device check 02/26/2024.  Per review of transmission from 03/08/2024 Pt has had further RV lead noise.  Noise also noted on presenting rhythm.  Outreach made to Pt.  Pt can only come for office visits on Mondays when her son is off work.  Scheduled Pt to have device evaluated 03/15/2024 at 10:00 am.  Will have Abbott representative available if programming questions.  Dr. Inocencio is also in office this day.

## 2024-03-10 NOTE — Progress Notes (Signed)
  Received: Today Claudene Delon LABOR, RN  Andris Brothers, Mitzie RAMAN, RN; P [facility] Device She is having oversensing on her RV lead.    I'll have her come in to see EP APP ASAP. Randall  Scheduled in device clinic 03/15/2024

## 2024-03-10 NOTE — Progress Notes (Signed)
 EPIC Encounter for ICM Monitoring  Patient Name: Ann Bernard is a 78 y.o. female Date: 03/10/2024 Primary Care Physican: Duwayne Katheryn HERO, PA Primary Cardiologist: Rolan Electrophysiologist: Inocencio Bi-V Pacing: 76% (02/26/2024 report shows 94%)          08/15/2023 Office Weight:  160 lbs Does not weigh at home   AT/AF Burden 0%       NSVT 1                                              Transmission results reviewed.    Since 02/02/2024 ICM Remote Transmission:  CorVue thoracic impedance suggesting normal fluid levels.  Message sent 03/10/2024 to device clinic to review Pacing and PVC%.   Prescribed:  Furosemide  20 mg take 1 tablet(s) (20 mg total) by mouth daily. Potassium 20 mEq take 1 tablet(s) (20 mEq total) by mouth daily. Spironolactone  25 mg take 1 tablet by mouth daily   Labs: 01/15/2024 Creatinine 0.85, BUN 16, Potassium 4.0, Sodium 140, GFR <60 12/19/2023 Creatinine 0.60, BUN 17, Potassium 3.9, Sodium 137, GFR >60 08/15/2023 Creatinine 0.79, BUN 10, Potassium 3.8, Sodium 140, GFR >60  06/25/2023 Creatinine 0.76, BUN 16, Potassium 4.2, Sodium 135, GFR >60  06/06/2023 Creatinine 0.91, BUN 15, Potassium 3.8, Sodium 137, GFR >60  06/04/2023 Creatinine 0.71, BUN 15, Potassium 3.8, Sodium 134, GFR >60  A complete set of results can be found in Results Review.   Recommendations:   No Changes.    Follow-up plan: ICM clinic phone appointment on 04/12/2024.   91 day device clinic remote transmission 05/21/2024.     EP/Cardiology Office Visits:  Recall 02/20/2025 with EP APP.  Recall 05/14/2024 with Dr. Rolan.     Copy of ICM check sent to Dr. Inocencio.    Remote monitoring is medically necessary for Heart Failure Management.    Daily Thoracic Impedance ICM trend: 12/09/2023 through 03/08/2024.    12-14 Month Thoracic Impedance ICM trend:     [name], RN 03/10/2024 9:25 AM

## 2024-03-15 ENCOUNTER — Ambulatory Visit: Attending: Cardiology

## 2024-03-15 DIAGNOSIS — Z95810 Presence of automatic (implantable) cardiac defibrillator: Secondary | ICD-10-CM

## 2024-03-15 DIAGNOSIS — I428 Other cardiomyopathies: Secondary | ICD-10-CM

## 2024-03-15 NOTE — Progress Notes (Signed)
 Patient brought in acutely today to evaluate periodic oversensing of RV lead noise seen on presenting rhythms with noted decline in BVP % from 94% 2 weeks ago to 74% today.   Chris (Abbott rep) present and assisted with check today.  After extensive testing and review with tech service engineers and Dr. Inocencio, it was determined that the RV lead likely has fractured and is causing an oversensed, non-intrinsic signal following pacing.  This has been confusing to evaluate due to the mis-interpretation that some of these were PVC's.  Isometrics performed multiple times without production of signal; however, in one random moment of patient scratching face, the event was captured and evaluated. Also, noted significant increase in events with increasing of the LRL to 70.    PLAN:  Dr. Inocencio reviewed with patient and family.  Due to concerns for lead fracture and inappropriate shocks, patient will need to be scheduled for implantation of a new lead.  Dr. Inocencio to discuss with Dr. Almetta and we will call patient back to schedule an appointment for next steps. Patient/son allowed to ask all questions and verbalize understanding and agreement with plan.    PROGRAMMING CHANGES:  1.  Pacing and Tachy therapies programmed off.  2. Mode changed to DDI, LRL of 50.  (See full list of programming changes below.

## 2024-03-17 ENCOUNTER — Telehealth: Payer: Self-pay

## 2024-03-17 NOTE — Telephone Encounter (Signed)
 Following back up with Dr. Inocencio regarding plans for extraction/dropping a new RV lead for this patient we saw in device clinic on Monday with RV lead noise resulting from suspected lead fracture.   Dr. Inocencio was going to discuss with Dr. Almetta and follow back up with next steps.  Patient has a CRT-D device, not dependent.  We have currently programmed off pacing and tachy therapies and programmed DDI 50.  (Refer to clinic visit note on 03/15/24).   Will call patient with next steps after hearing back from Dr. Deno.

## 2024-03-17 NOTE — Telephone Encounter (Signed)
 Spoke with Dr. Inocencio.   Dr. Almetta is going to see patient in clinic to discuss.   Forwarding to scheduling team to get an expedited appointment in office and contact patient.

## 2024-03-18 ENCOUNTER — Other Ambulatory Visit (HOSPITAL_COMMUNITY): Payer: Self-pay

## 2024-03-18 NOTE — Telephone Encounter (Signed)
 Spoke w/ patient - she is scheduled to see Dr. Almetta on 11/5 at 8am. She stated she would check with her son to make sure this would work with his schedule and call me back if not.   *ok to double book per Dr. Almetta

## 2024-03-21 NOTE — Progress Notes (Unsigned)
 Cardiology Office Note   Date:  03/24/2024  ID:  Ann Bernard, DOB 1946/04/24, MRN [id number] PCP: Duwayne Katheryn HERO, PA  [facility] Providers Cardiologist:  None Electrophysiologist:  Will Gladis Norton, MD  Advanced Heart Failure:  Ezra Shuck, MD   History of Present Illness Ann Bernard is a 78 y.o. female with HFrEF s/p LEFT Abbott CRTD (DOI 08/27/19, Dr. Norton), HTN, HLD, and tobacco abuse who presents for lead management.   Remote monitoring with RV lead noise and decrease in BiV pacing (94%->74%) since recent device check.  Evaluated in clinic and isometrics initially did not reveal any abnormalities however subsequently there was 1 event observed which was captured and showed oversensing of RV lead noise consistent with likely fracture.  Pacing and tachy therapies were programmed off and the mode was switched to DDI with LRL 50.  Today she presents back to clinic with her son.  She has no complaints.  Device interrogation with isometrics did not reveal recurrent RV lead noise initially however RV lead capture was intermittent at max output and with noise during pacing.  She is not device dependent.  ROS: no complaints   Studies Reviewed  ECG review 02/26/24: AS-BVP 66, PR 146, QRS 130, QT/c 428/448, intermittent fusion, several intrinsic complexes with ventricular oversensing  08/15/23: AS-BVP 73, PR 128, QRS 128, QT/c 428/471, isolated PAC 06/25/23: AS-BVP 68, PR 156, QRS 140, QT/c 409/435  TTE Result date: 09/01/23  1. Left ventricular ejection fraction, by estimation, is 45 to 50%. The  left ventricle has mildly decreased function. The left ventricle  demonstrates global hypokinesis. Left ventricular diastolic parameters are  consistent with Grade I diastolic  dysfunction (impaired relaxation).   2. Right ventricular systolic function is mildly reduced. The right  ventricular size is normal.   3. The mitral valve is grossly normal. Mild mitral valve  regurgitation.  No evidence of mitral stenosis.   4. The aortic valve is tricuspid. There is mild calcification of the  aortic valve. Aortic valve regurgitation is mild. Aortic valve sclerosis  is present, with no evidence of aortic valve stenosis.   TTE Result date: 12/02/19  1. Left ventricular ejection fraction, by estimation, is 50%. The left  ventricle has mildly decreased function. The left ventricle demonstrates  global hypokinesis. There is mild left ventricular hypertrophy. Left  ventricular diastolic parameters are  consistent with Grade I diastolic dysfunction (impaired relaxation).   2. Right ventricular systolic function is normal. The right ventricular  size is normal. There is normal pulmonary artery systolic pressure. The  estimated right ventricular systolic pressure is 20.1 mmHg.   3. Left atrial size was mildly dilated.   4. The mitral valve is normal in structure. No evidence of mitral valve  regurgitation. No evidence of mitral stenosis.   5. The aortic valve is tricuspid. Aortic valve regurgitation is trivial.  Mild aortic valve sclerosis is present, with no evidence of aortic valve  stenosis.   6. The inferior vena cava is normal in size with greater than 50%  respiratory variability, suggesting right atrial pressure of 3 mmHg.   TTE  Result date: 06/25/19  1. Left ventricular ejection fraction, by visual estimation, is 25 to  30%. The left ventricle has normal function. There is no left ventricular  hypertrophy.   2. Abnormal septal motion consistent with left bundle branch block.   3. Left ventricular diastolic parameters are consistent with Grade I  diastolic dysfunction (impaired relaxation).   4. The left ventricle  demonstrates global hypokinesis.   5. Global right ventricle has normal systolic function.The right  ventricular size is normal.   6. Left atrial size was normal.   7. Right atrial size was normal.   8. The mitral valve is normal in  structure. Trivial mitral valve  regurgitation. No evidence of mitral stenosis.   9. The tricuspid valve is normal in structure. Tricuspid valve  regurgitation is trivial.  10. The aortic valve is tricuspid. Aortic valve regurgitation is mild.  Mild aortic valve sclerosis without stenosis.  11. The pulmonic valve was not well visualized. Pulmonic valve  regurgitation is not visualized.  12. The inferior vena cava is normal in size with greater than 50%  respiratory variability, suggesting right atrial pressure of 3 mmHg.  13. Severe global reduction in LV systolic function; grade 1 diastolic  dysfunction; mild AI.   Physical Exam VS:  BP 132/83   Pulse 73   Ht 5' 7 (1.702 m)   Wt 165 lb (74.8 kg)   BMI 25.84 kg/m       Wt Readings from Last 3 Encounters:  03/24/24 165 lb (74.8 kg)  02/26/24 170 lb (77.1 kg)  01/15/24 169 lb 12.8 oz (77 kg)    GEN: Well nourished, well developed in no acute distress NECK: No JVD; No carotid bruits CARDIAC: RRR, no murmurs, rubs, gallops RESPIRATORY:  Clear to auscultation without rales, wheezing or rhonchi  ABDOMEN: Soft, non-tender, non-distended EXTREMITIES:  No edema; No deformity  SKIN: LEFT infraclavicular incision well healed without erythema or swelling  Device information CRTD: Abbott [id number] Gallant HF, DOI 08/27/19 RA: SJM [id number] Tendril MRI, SN [id number], DOI 08/27/19 RV: SJM 2877V Rico MADRID, SN [id number], DOI 08/27/19 LV: SJM 1458Q Quartet, SN [id number], DOI 08/27/19  ASSESSMENT AND PLAN Ann Bernard is a 78 y.o. female with HFrEF s/p LEFT Abbott CRTD (DOI 08/27/19, Dr. Inocencio), HTN, HLD, and tobacco abuse who presents for lead management.   HFrEF/NICM LEFT Abbott CRTD RV-d lead noise RV-d lead threshold increase Repeat device interrogation today without any lead noise with isometric maneuvers initially.  RV pacing however with intermittent capture with max output and abnormal noise with RV pacing.  We reviewed the  risk, benefits and alternatives of RV-d lead extraction and replacement including but not limited to device site pain, infection, damage to the brachiocephalic/SVC, damage to the lung requiring drain, damage to the tricuspid valve requiring repair/replacement, damage to the heart wall/perforation requiring emergency drain and/or median sternotomy, life-threatening rhythms and death.  Alternatives to this would be placing a new RV-D lead with abandonment of the pre-existing lead.  There is no way to program around the lead fracture.  With the leads only 78 years old I think the risk of brachiocephalic/SVC laceration and/or cardiac perforation is less than 0.5%.  She and her son understand these risk and would like to proceed with extraction of the fractured RV-D lead and replacement.  Will coordinate with Dr. Kennyth and/or Dr. Waddell to assist in the procedure.  Will also need to coordinate for surgical backup.  Pre Procedure planning:  Schedule lead extraction of fractured RV-d lead with replacement  Plan to replace CRTD generator at the same time (Longevity 2.1-3.2 years) to minimize repeat procedures  All medications can be given the day prior to the procedure, hold all OP AM meds the day of the procedure  She is not on any OAC Will need to coordinate planning with assistance from Dr. Kennyth and/or Dr. Waddell to be  present along with CTS backup  No pre procedural imaging required  Dispo: RTC with Dr. Inocencio  A total of 30 minutes was spent preparing for the patient, reviewing history, performing exam, document encounter, coordinating care and counseling the patient. 20 minutes was spent with direct patient care.   Signed, Donnice DELENA Primus, MD

## 2024-03-24 ENCOUNTER — Ambulatory Visit
Attending: Student in an Organized Health Care Education/Training Program | Admitting: Student in an Organized Health Care Education/Training Program

## 2024-03-24 ENCOUNTER — Encounter: Payer: Self-pay | Admitting: Student in an Organized Health Care Education/Training Program

## 2024-03-24 VITALS — BP 132/83 | HR 73 | Ht 67.0 in | Wt 165.0 lb

## 2024-03-24 DIAGNOSIS — I5022 Chronic systolic (congestive) heart failure: Secondary | ICD-10-CM | POA: Diagnosis not present

## 2024-03-24 LAB — CUP PACEART INCLINIC DEVICE CHECK
Battery Remaining Longevity: 39 mo
Brady Statistic RA Percent Paced: 0.02 %
Brady Statistic RV Percent Paced: 0.17 %
Date Time Interrogation Session: 20251105092950
HighPow Impedance: 70.875
Implantable Lead Connection Status: 753985
Implantable Lead Connection Status: 753985
Implantable Lead Connection Status: 753985
Implantable Lead Implant Date: 20210409
Implantable Lead Implant Date: 20210409
Implantable Lead Implant Date: 20210409
Implantable Lead Location: 753858
Implantable Lead Location: 753859
Implantable Lead Location: 753860
Implantable Pulse Generator Implant Date: 20210409
Lead Channel Impedance Value: 450 Ohm
Lead Channel Impedance Value: 525 Ohm
Lead Channel Impedance Value: 562.5 Ohm
Lead Channel Pacing Threshold Amplitude: 0.75 V
Lead Channel Pacing Threshold Amplitude: 0.75 V
Lead Channel Pacing Threshold Amplitude: 2 V
Lead Channel Pacing Threshold Amplitude: 2 V
Lead Channel Pacing Threshold Amplitude: 5.5 V
Lead Channel Pacing Threshold Amplitude: 5.5 V
Lead Channel Pacing Threshold Pulse Width: 0.5 ms
Lead Channel Pacing Threshold Pulse Width: 0.5 ms
Lead Channel Pacing Threshold Pulse Width: 1 ms
Lead Channel Pacing Threshold Pulse Width: 1 ms
Lead Channel Pacing Threshold Pulse Width: 1 ms
Lead Channel Pacing Threshold Pulse Width: 1 ms
Lead Channel Sensing Intrinsic Amplitude: 11.9 mV
Lead Channel Sensing Intrinsic Amplitude: 2 mV
Lead Channel Setting Pacing Amplitude: 2 V
Lead Channel Setting Pacing Amplitude: 2.5 V
Lead Channel Setting Pacing Amplitude: 2.75 V
Lead Channel Setting Pacing Pulse Width: 1 ms
Lead Channel Setting Pacing Pulse Width: 1 ms
Lead Channel Setting Sensing Sensitivity: 0.5 mV
Pulse Gen Serial Number: 111006673

## 2024-03-24 NOTE — Patient Instructions (Signed)
 Medication Instructions:  - Your physician recommends that you continue on your current medications as directed. Please refer to the Current Medication list given to you today.  *If you need a refill on your cardiac medications before your next appointment, please call your pharmacy*  Lab Work: - pending procedure date  If you have labs (blood work) drawn today and your tests are completely normal, you will receive your results only by: MyChart Message (if you have MyChart) OR A paper copy in the mail If you have any lab test that is abnormal or we need to change your treatment, we will call you to review the results.  Testing/Procedures: - You will be called with a date/ time for your procedure.   Follow-Up: At [facility], you and your health needs are our priority.  As part of our continuing mission to provide you with exceptional heart care, our providers are all part of one team.  This team includes your primary Cardiologist (physician) and Advanced Practice Providers or APPs (Physician Assistants and Nurse Practitioners) who all work together to provide you with the care you need, when you need it.  Your next appointment:   - Pending procedure date   Provider:   You may see Donnice Primus, MD or one of the following Advanced Practice Providers on your designated Care Team:   Charlies Arthur, [place] Ozell Jodie Passey, PA-C Suzann Riddle, NP Daphne Barrack, NP Artist Pouch, PA-C     We recommend signing up for the patient portal called MyChart.  Sign up information is provided on this After Visit Summary.  MyChart is used to connect with patients for Virtual Visits (Telemedicine).  Patients are able to view lab/test results, encounter notes, upcoming appointments, etc.  Non-urgent messages can be sent to your provider as well.   To learn more about what you can do with MyChart, go to [email or web address].   Other Instructions N/A

## 2024-03-30 ENCOUNTER — Telehealth: Payer: Self-pay

## 2024-03-30 ENCOUNTER — Ambulatory Visit: Payer: Self-pay | Admitting: Cardiology

## 2024-03-30 DIAGNOSIS — I428 Other cardiomyopathies: Secondary | ICD-10-CM

## 2024-03-30 DIAGNOSIS — I5022 Chronic systolic (congestive) heart failure: Secondary | ICD-10-CM

## 2024-03-30 NOTE — Telephone Encounter (Signed)
 Called pt's Son and scheduled her procedure with Dr. Almetta for 12/11 at 2:30 pm. She is scheduled for RV lead extraction and re-implant with BiV-ICD Gen Change.   Dr. Waddell will proctor and Dr. Shyrl will be backup surgeon.   Pt's Son will bring her to [facility] on 11/17 for labs and will pick up Instruction letter and scrub.

## 2024-04-05 ENCOUNTER — Other Ambulatory Visit (HOSPITAL_COMMUNITY): Payer: Self-pay

## 2024-04-05 LAB — CBC

## 2024-04-06 LAB — BASIC METABOLIC PANEL WITH GFR
BUN/Creatinine Ratio: 19 (ref 12–28)
BUN: 16 mg/dL (ref 8–27)
CO2: 26 mmol/L (ref 20–29)
Calcium: 9.8 mg/dL (ref 8.7–10.3)
Chloride: 102 mmol/L (ref 96–106)
Creatinine, Ser: 0.84 mg/dL (ref 0.57–1.00)
Glucose: 97 mg/dL (ref 70–99)
Potassium: 4.5 mmol/L (ref 3.5–5.2)
Sodium: 140 mmol/L (ref 134–144)
eGFR: 71 mL/min/1.73 (ref >59)

## 2024-04-06 LAB — CBC
Hematocrit: 45.9 % (ref 34.0–46.6)
Hemoglobin: 15.3 g/dL (ref 11.1–15.9)
MCH: 32.7 pg (ref 26.6–33.0)
MCHC: 33.3 g/dL (ref 31.5–35.7)
MCV: 98 fL — AB (ref 79–97)
Platelets: 155 x10E3/uL (ref 150–450)
RBC: 4.68 x10E6/uL (ref 3.77–5.28)
RDW: 13 % (ref 11.7–15.4)
WBC: 4.6 x10E3/uL (ref 3.4–10.8)

## 2024-04-08 ENCOUNTER — Telehealth (HOSPITAL_COMMUNITY): Payer: Self-pay

## 2024-04-08 NOTE — Telephone Encounter (Signed)
 Spoke with patient to discuss upcoming procedure.   Confirmed patient is scheduled for a Lead Extraction on Thursday, December 11 with Dr. Dr. Almetta. Instructed patient to arrive at the Main Entrance A at [facility]: [address] [place], [place] [address] and check in at Admitting at 12:30 PM.   Labs completed  Any recent signs of acute illness or been started on antibiotics? No  Any new medications started? No Any medications to hold? Yes  HOLD: Dapagliflozin  (Farxiga ) for 3 days prior to the procedure. Last dose on Sunday, December 07.  Medication instructions:  On the morning of your procedure ONLY take Carvedilol  and Entresto . No eating or drinking after midnight prior to procedure.   The night before your procedure and the morning of your procedure, wash thoroughly with the CHG surgical soap from the neck down, paying special attention to the area where your procedure will be performed.  You will be staying overnight at the hospital. Please bring necessary items with you.  You MUST have a responsible adult to drive you home and MUST be with you the first 24 hours after you arrive home.  Informed patient a nurse will call a day before the procedure to confirm arrival time and ensure instructions are followed.  Patient verbalized understanding to all instructions provided and agreed to proceed with procedure.

## 2024-04-12 ENCOUNTER — Other Ambulatory Visit (HOSPITAL_COMMUNITY): Payer: Self-pay

## 2024-04-12 ENCOUNTER — Ambulatory Visit: Attending: Cardiology

## 2024-04-12 DIAGNOSIS — Z95810 Presence of automatic (implantable) cardiac defibrillator: Secondary | ICD-10-CM | POA: Diagnosis not present

## 2024-04-12 DIAGNOSIS — I5022 Chronic systolic (congestive) heart failure: Secondary | ICD-10-CM

## 2024-04-13 NOTE — Progress Notes (Signed)
 EPIC Encounter for ICM Monitoring  Patient Name: Ann Bernard is a 78 y.o. female Date: 04/13/2024 Primary Care Physican: Duwayne Katheryn HERO, PA Primary Cardiologist: Rolan Electrophysiologist: Inocencio Bi-V Pacing: <1%  08/15/2023 Office Weight:  160 lbs Does not weigh at home   AT/AF Burden 0%       NSVT 1                                              Transmission results reviewed. Scheduled for lead extraction 04/29/2024   Since 02/02/2024 ICM Remote Transmission:  CorVue thoracic impedance suggesting normal fluid levels.  No recording 03/14/2024-03/28/2024.   Prescribed:  Furosemide  20 mg take 1 tablet(s) (20 mg total) by mouth daily. Potassium 20 mEq take 1 tablet(s) (20 mEq total) by mouth daily. Spironolactone  25 mg take 1 tablet by mouth daily   Labs: 01/15/2024 Creatinine 0.85, BUN 16, Potassium 4.0, Sodium 140, GFR <60 12/19/2023 Creatinine 0.60, BUN 17, Potassium 3.9, Sodium 137, GFR >60 08/15/2023 Creatinine 0.79, BUN 10, Potassium 3.8, Sodium 140, GFR >60  06/25/2023 Creatinine 0.76, BUN 16, Potassium 4.2, Sodium 135, GFR >60  06/06/2023 Creatinine 0.91, BUN 15, Potassium 3.8, Sodium 137, GFR >60  06/04/2023 Creatinine 0.71, BUN 15, Potassium 3.8, Sodium 134, GFR >60  A complete set of results can be found in Results Review.   Recommendations:   No Changes.    Follow-up plan: ICM clinic phone appointment on 05/24/2024   91 day device clinic remote transmission 05/21/2024.     EP/Cardiology Office Visits:  Recall 02/20/2025 with EP APP.  Recall 05/14/2024 with Dr. Rolan.     Copy of ICM check sent to Dr. Inocencio.    Remote monitoring is medically necessary for Heart Failure Management.    Daily Thoracic Impedance ICM trend: 01/13/2024 through 04/12/2024.    12-14 Month Thoracic Impedance ICM trend:     [name], RN 04/13/2024 5:25 PM

## 2024-04-19 ENCOUNTER — Other Ambulatory Visit (HOSPITAL_COMMUNITY): Payer: Self-pay

## 2024-04-27 ENCOUNTER — Encounter: Payer: Self-pay | Admitting: Emergency Medicine

## 2024-04-29 ENCOUNTER — Observation Stay (HOSPITAL_COMMUNITY)
Admission: RE | Admit: 2024-04-29 | Discharge: 2024-04-30 | Disposition: A | Discharge status: Home / Self Care | Attending: Student in an Organized Health Care Education/Training Program | Admitting: Student in an Organized Health Care Education/Training Program

## 2024-04-29 ENCOUNTER — Other Ambulatory Visit: Payer: Self-pay

## 2024-04-29 ENCOUNTER — Ambulatory Visit (HOSPITAL_COMMUNITY): Admitting: Anesthesiology

## 2024-04-29 ENCOUNTER — Ambulatory Visit (HOSPITAL_COMMUNITY)

## 2024-04-29 ENCOUNTER — Encounter (HOSPITAL_COMMUNITY): Payer: Self-pay | Admitting: Student in an Organized Health Care Education/Training Program

## 2024-04-29 ENCOUNTER — Ambulatory Visit (HOSPITAL_COMMUNITY)
Admission: RE | Disposition: A | Discharge status: Home / Self Care | Payer: Self-pay | Source: Home / Self Care | Attending: Student in an Organized Health Care Education/Training Program

## 2024-04-29 DIAGNOSIS — I5021 Acute systolic (congestive) heart failure: Secondary | ICD-10-CM

## 2024-04-29 DIAGNOSIS — I2510 Atherosclerotic heart disease of native coronary artery without angina pectoris: Secondary | ICD-10-CM | POA: Insufficient documentation

## 2024-04-29 DIAGNOSIS — Z95810 Presence of automatic (implantable) cardiac defibrillator: Secondary | ICD-10-CM | POA: Diagnosis present

## 2024-04-29 DIAGNOSIS — E785 Hyperlipidemia, unspecified: Secondary | ICD-10-CM | POA: Insufficient documentation

## 2024-04-29 DIAGNOSIS — I5022 Chronic systolic (congestive) heart failure: Principal | ICD-10-CM | POA: Insufficient documentation

## 2024-04-29 DIAGNOSIS — Y712 Prosthetic and other implants, materials and accessory cardiovascular devices associated with adverse incidents: Secondary | ICD-10-CM | POA: Insufficient documentation

## 2024-04-29 DIAGNOSIS — Z01818 Encounter for other preprocedural examination: Secondary | ICD-10-CM

## 2024-04-29 DIAGNOSIS — I110 Hypertensive heart disease with heart failure: Secondary | ICD-10-CM

## 2024-04-29 DIAGNOSIS — Z79899 Other long term (current) drug therapy: Secondary | ICD-10-CM | POA: Insufficient documentation

## 2024-04-29 DIAGNOSIS — I428 Other cardiomyopathies: Secondary | ICD-10-CM | POA: Diagnosis not present

## 2024-04-29 DIAGNOSIS — T82110A Breakdown (mechanical) of cardiac electrode, initial encounter: Secondary | ICD-10-CM | POA: Insufficient documentation

## 2024-04-29 HISTORY — PX: BIV ICD GENERATOR CHANGEOUT: EP1194

## 2024-04-29 HISTORY — PX: LEAD INSERTION: EP1212

## 2024-04-29 HISTORY — PX: LEAD EXTRACTION: EP1211

## 2024-04-29 LAB — SURGICAL PCR SCREEN
MRSA, PCR: NEGATIVE
Staphylococcus aureus: NEGATIVE

## 2024-04-29 LAB — PREPARE RBC (CROSSMATCH)

## 2024-04-29 SURGERY — LEAD EXTRACTION
Anesthesia: General

## 2024-04-29 MED ORDER — CEFAZOLIN SODIUM-DEXTROSE 2-4 GM/100ML-% IV SOLN
INTRAVENOUS | Status: AC
  Filled 2024-04-29: qty 100

## 2024-04-29 MED ORDER — CHLORHEXIDINE GLUCONATE 0.12 % MT SOLN
OROMUCOSAL | Status: AC
  Administered 2024-04-29: 15 mL
  Filled 2024-04-29: qty 15

## 2024-04-29 MED ORDER — FENTANYL CITRATE (PF) 250 MCG/5ML IJ SOLN
INTRAMUSCULAR | Status: DC | PRN
  Administered 2024-04-29 (×4): 50 ug via INTRAVENOUS

## 2024-04-29 MED ORDER — FUROSEMIDE 20 MG PO TABS
20.0000 mg | ORAL_TABLET | Freq: Every day | ORAL | Status: DC
  Administered 2024-04-30: 20 mg via ORAL
  Filled 2024-04-29: qty 1

## 2024-04-29 MED ORDER — POVIDONE-IODINE 10 % EX SWAB
2.0000 | Freq: Once | CUTANEOUS | Status: AC
  Administered 2024-04-29: 2 via TOPICAL

## 2024-04-29 MED ORDER — PROPOFOL 10 MG/ML IV BOLUS
INTRAVENOUS | Status: DC | PRN
  Administered 2024-04-29: 100 mg via INTRAVENOUS

## 2024-04-29 MED ORDER — DAPAGLIFLOZIN PROPANEDIOL 10 MG PO TABS
10.0000 mg | ORAL_TABLET | Freq: Every day | ORAL | Status: DC
  Administered 2024-04-30: 10 mg via ORAL
  Filled 2024-04-29: qty 1

## 2024-04-29 MED ORDER — LIDOCAINE 2% (20 MG/ML) 5 ML SYRINGE
INTRAMUSCULAR | Status: DC | PRN
  Administered 2024-04-29: 80 mg via INTRAVENOUS

## 2024-04-29 MED ORDER — SODIUM CHLORIDE 0.9% IV SOLUTION: Freq: Once | INTRAVENOUS | Status: AC

## 2024-04-29 MED ORDER — ACETAMINOPHEN 500 MG PO TABS
1000.0000 mg | ORAL_TABLET | Freq: Once | ORAL | Status: AC
  Administered 2024-04-29: 1000 mg via ORAL
  Filled 2024-04-29: qty 2

## 2024-04-29 MED ORDER — ATORVASTATIN CALCIUM 40 MG PO TABS
40.0000 mg | ORAL_TABLET | Freq: Every evening | ORAL | Status: DC
  Administered 2024-04-29: 40 mg via ORAL
  Filled 2024-04-29: qty 1

## 2024-04-29 MED ORDER — SPIRONOLACTONE 25 MG PO TABS
25.0000 mg | ORAL_TABLET | Freq: Every day | ORAL | Status: DC
  Administered 2024-04-30: 25 mg via ORAL
  Filled 2024-04-29: qty 1

## 2024-04-29 MED ORDER — CARVEDILOL 12.5 MG PO TABS
ORAL_TABLET | ORAL | Status: AC
  Filled 2024-04-29: qty 1

## 2024-04-29 MED ORDER — SODIUM CHLORIDE 0.9 % IV SOLN: INTRAVENOUS | Status: DC

## 2024-04-29 MED ORDER — ROCURONIUM BROMIDE 10 MG/ML (PF) SYRINGE
PREFILLED_SYRINGE | INTRAVENOUS | Status: DC | PRN
  Administered 2024-04-29: 60 mg via INTRAVENOUS

## 2024-04-29 MED ORDER — SUGAMMADEX SODIUM 200 MG/2ML IV SOLN
INTRAVENOUS | Status: DC | PRN
  Administered 2024-04-29: 200 mg via INTRAVENOUS

## 2024-04-29 MED ORDER — PHENYLEPHRINE HCL-NACL 20-0.9 MG/250ML-% IV SOLN
INTRAVENOUS | Status: DC | PRN
  Administered 2024-04-29: 30 ug/min via INTRAVENOUS

## 2024-04-29 MED ORDER — SACUBITRIL-VALSARTAN 24-26 MG PO TABS
1.0000 | ORAL_TABLET | Freq: Two times a day (BID) | ORAL | Status: DC
  Administered 2024-04-30: 1 via ORAL
  Filled 2024-04-29: qty 1

## 2024-04-29 MED ORDER — HEPARIN (PORCINE) IN NACL 1000-0.9 UT/500ML-% IV SOLN
INTRAVENOUS | Status: DC | PRN
  Administered 2024-04-29: 500 mL

## 2024-04-29 MED ORDER — FENTANYL CITRATE (PF) 100 MCG/2ML IJ SOLN
INTRAMUSCULAR | Status: AC
  Filled 2024-04-29: qty 2

## 2024-04-29 MED ORDER — ONDANSETRON HCL 4 MG/2ML IJ SOLN
INTRAMUSCULAR | Status: DC | PRN
  Administered 2024-04-29: 4 mg via INTRAVENOUS

## 2024-04-29 MED ORDER — PHENYLEPHRINE 80 MCG/ML (10ML) SYRINGE FOR IV PUSH (FOR BLOOD PRESSURE SUPPORT)
PREFILLED_SYRINGE | INTRAVENOUS | Status: DC | PRN
  Administered 2024-04-29 (×2): 80 ug via INTRAVENOUS

## 2024-04-29 MED ORDER — CARVEDILOL 12.5 MG PO TABS
12.5000 mg | ORAL_TABLET | Freq: Once | ORAL | Status: AC
  Administered 2024-04-29: 12.5 mg via ORAL

## 2024-04-29 MED ORDER — CHLORHEXIDINE GLUCONATE 4 % EX SOLN
4.0000 | Freq: Once | CUTANEOUS | Status: DC
  Filled 2024-04-29: qty 60

## 2024-04-29 MED ORDER — CEFAZOLIN SODIUM-DEXTROSE 2-4 GM/100ML-% IV SOLN
2.0000 g | INTRAVENOUS | Status: AC
  Administered 2024-04-29: 2 g via INTRAVENOUS
  Filled 2024-04-29: qty 100

## 2024-04-29 MED ORDER — SODIUM CHLORIDE 0.9 % IV SOLN
INTRAVENOUS | Status: AC
  Administered 2024-04-29: 80 mg
  Filled 2024-04-29: qty 2

## 2024-04-29 MED ORDER — DEXAMETHASONE SOD PHOSPHATE PF 10 MG/ML IJ SOLN
INTRAMUSCULAR | Status: DC | PRN
  Administered 2024-04-29: 10 mg via INTRAVENOUS

## 2024-04-29 MED ORDER — SODIUM CHLORIDE 0.9 % IV SOLN: 80.0000 mg | INTRAVENOUS | Status: AC

## 2024-04-29 MED ADMIN — Acetaminophen Tab 325 MG: 975 mg | ORAL | NDC 50580045811

## 2024-04-29 MED FILL — Acetaminophen Tab 325 MG: 975.0000 mg | ORAL | Qty: 3 | Status: AC

## 2024-04-29 SURGICAL SUPPLY — 19 items
CABLE SURGICAL S-101-97-12 (CABLE) ×1 IMPLANT
CLOSURE PERCLOSE PROSTYLE (Vascular Products) IMPLANT
COIL COMPR LEAD EXTR ONE-TIE (VASCULAR PRODUCTS) IMPLANT
ICD GALLANT HFCRTD CDHFA500Q (ICD Generator) IMPLANT
KIT MICROPUNCTURE NIT STIFF (SHEATH) IMPLANT
KIT STYLET 52CM (MISCELLANEOUS) IMPLANT
KIT WRENCH PACEMAKER ASSEM (MISCELLANEOUS) IMPLANT
LEAD DURATA 7122Q-65CM (Lead) IMPLANT
LEAD EXTR SH CNTRL-R SHEATH 11 (SHEATH) IMPLANT
LEAD EXTRACT LBRTR LOCK STYLET (MISCELLANEOUS) IMPLANT
PAD DEFIB RADIO PHYSIO CONN (PAD) ×1 IMPLANT
POUCH AIGIS-R ANTIBACT ICD LRG (Mesh General) IMPLANT
SHEATH 7FR PRELUDE SNAP 13 (SHEATH) IMPLANT
SHEATH 7FR PRELUDE SNAP 25 (SHEATH) IMPLANT
SHEATH PINNACLE 8F 10CM (SHEATH) IMPLANT
SHEATH PROBE COVER 6X72 (BAG) IMPLANT
TRAY PACEMAKER INSERTION (PACKS) ×1 IMPLANT
WIRE HI TORQ VERSACORE-J 145CM (WIRE) IMPLANT
WIRE MAILMAN 182CM (WIRE) IMPLANT

## 2024-04-29 NOTE — Anesthesia Procedure Notes (Addendum)
 Arterial Line Insertion Start/End12/03/2024 12:25 PM, 04/29/2024 12:35 PM Performed by: Corinne Garnette BRAVO, MD, anesthesiologist  Patient location: Pre-op. Preanesthetic checklist: patient identified, IV checked, site marked, risks and benefits discussed, surgical consent, monitors and equipment checked, pre-op evaluation, timeout performed and anesthesia consent Lidocaine  1% used for infiltration Right, radial was placed Catheter size: 20 G Hand hygiene performed , maximum sterile barriers used  and Seldinger technique used Allen's test indicative of satisfactory collateral circulation Attempts: 1 Procedure performed using ultrasound to evaluate access site. Ultrasound Notes:relevant anatomy identified, ultrasound used to visualize needle entry, vessel patent under ultrasound and image(s) printed for medical record. Following insertion, dressing applied and Biopatch. Post procedure assessment: normal  Patient tolerated the procedure well with no immediate complications.

## 2024-04-29 NOTE — Anesthesia Procedure Notes (Signed)
 Procedure Name: Intubation Date/Time: 04/29/2024 1:59 PM  Performed by: Chaney Ozell CROME, CRNAPre-anesthesia Checklist: Patient identified, Emergency Drugs available, Suction available and Patient being monitored Patient Re-evaluated:Patient Re-evaluated prior to induction Oxygen Delivery Method: Circle System Utilized Preoxygenation: Pre-oxygenation with 100% oxygen Induction Type: IV induction Ventilation: Mask ventilation without difficulty Laryngoscope Size: Mac and 3 Grade View: Grade I Tube type: Oral Tube size: 7.5 mm Number of attempts: 1 Airway Equipment and Method: Stylet and Oral airway Placement Confirmation: ETT inserted through vocal cords under direct vision, positive ETCO2 and breath sounds checked- equal and bilateral Secured at: 21 cm Tube secured with: Tape Dental Injury: Teeth and Oropharynx as per pre-operative assessment

## 2024-04-29 NOTE — Progress Notes (Signed)
°  Echocardiogram Echocardiogram Transesophageal has been performed.  Ann Bernard 04/29/2024, 3:20 PM

## 2024-04-29 NOTE — Op Note (Signed)
 Procedure:  Extraction of chronically implanted RV-ICD lead (CPT 33244) Reimplant of RV-ICD lead (CPT [phone]) CRTD generator replacement (CPT 33231)  Pre-op Diagnosis: RV-ICD lead failure/fracture, HFrEF/NICM with LVEF recovery with CRT    Post-op Diagnosis: same  Procedure Date: 04/29/2024  Attending: Adina Primus, MD, Danelle Birmingham, MD   Surgical Backup: Con Clunes, MD   Anesthesia: GA   Procedure Description: Description of the procedure: After informed consent was obtained, the patient was taken to the EP lab in the fasting state. After usual preparation draping, the anesthesia service was utilized to provide general endotracheal anesthesia and invasive arterial hemodynamic monitoring. A 7 French sheath was inserted percutaneously in the right femoral vein.    At this point attention was turned to the CRTD pocket. A 5 cm incision was carried out and electrocautery utilized to dissect down to the pacemaker generator which was removed with gentle traction. The leads were freed up from the surrounding tissue with electrocautery. Attempted to obtain L subclavian vein access with standard access needle and ultrasound guidance. The vessel was accessed with brisk backflow however no wires were able to be advanced into the vessel past the 1st rib. Under fluoroscopy guidance a micropuncture needle was used to access the vein at the first rib. Again wire was unable to be advanced despite backflow. This was consistent with occlusion. Next attention was turned to the RV-ICD lead. The sewing sleeve was freed from the lead and the silk suture was removed. A stylet was advanced down the RV lead but was unable to be advanced past the tricuspid valve annulus.  Countertraction on the lead was applied along with attempted retraction of the screw however this was unable to be retracted. A Cook Liberator locking stylet was advanced down the RV-ICD lumen to the mid RV lead coil. Stylets were also advanced down  the RA and LV leads to provide support. The Cook Evolution Shortie RL 11 Fr Controlled-Rotation Dilator sheath was advanced over the RV lead. The RV lead began to retract once the sheath was advanced past the proximal venotomy. There was an area of moderate binding past the first rib so the [facility] sheath was advanced with gentle lead traction. Once the sheath was advanced 7 cm from the venotomy site a guidewire was introduced through the [facility] sheath and was able to be passed to the IVC.  The lead was retracted and the [facility] sheath was removed. A 7 Fr long sheath was advanced over the guidewire to the mid RA.  Dilator and wire were removed and the sheath was aspirated.   The RV ICD lead was inserted into the sheath and the lead was then advanced into the RVOT. After exchange of pigtail shaped stylet for a curved stylet, the lead was retracted into the RV cavity and advanced to the RV apical septum. Two view flouroscopy then confirmed RV apical, septal position. The helix was extended and the lead fixation was tested with lead tension. Interrogation revealed a current of injury with acceptable lead parameters (see below). The sheath was split and removed and the RV ICD lead was firmly attached to the pre-pectoralis fascia with three 0-silk sutures. One additional 0-silk suture was used to secure the RV-ICD lead to fascia distal to the suture sleeve. One 2-0 Vicryl was used to close the tissues around all 3 leads to assist with hemostasis.   The device was then connected to the leads. The pocket was irrigated with gentamicin  solution. A TYRX pouch was used.  The device was affixed to the underlying fascia with a single 0-silk suture. The pocket was closed with continuous 2-0 V-Loc and 3-0 Stratafix. Dermabond was applied over the incision.   The device was evaluated by the representative of the cardiac device manufacturer with implant parameters listed below.   The RFV access site was closed with Perclose ProStyle  suture x1 with good hemostasis.   Complications: none Estimated blood loss: less than 50 mL  Implanted Hardware: Implant Name Type Inv. Item Serial No. Manufacturer Lot No. LRB No. Used Action  ICD GALLANT HFCRTD [id number] - [id number] ICD Generator ICD GALLANT HFCRTD [id number] [employer]  N/A 1 Explanted  LEAD DURATA 7122Q-65CM - [id number] Lead LEAD DURATA 7122Q-65CM [id number] [employer]  N/A 1 Explanted  LEAD DURATA 7122Q-65CM - [id number] Lead LEAD DURATA 7122Q-65CM [id number] [employer]  N/A 1 Implanted  ICD GALLANT HFCRTD CDHFA500Q - [id number] ICD Generator ICD GALLANT HFCRTD [id number] [employer]  N/A 1 Implanted  CLOSURE PERCLOSE PROSTYLE - [id number] Vascular Products CLOSURE PERCLOSE PROSTYLE  ABBOTT [employer]ICES [id number] N/A 1 Implanted  POUCH AIGIS-R ANTIBACT ICD LRG - [id number] Mesh General POUCH AIGIS-R ANTIBACT ICD LRG  [employer] RHY[employer]M [employer]NAG[employer] [id number] N/A 1 Implanted   Device information CRTD: Abbott [id number] Gallant HF, SN [id number], DOI 04/29/24 RA: SJM LPA1200M Tendril MRI, SN [id number], DOI 08/27/19 RV: SJM 7122Q Rico MADRID, SN [id number], DOI 04/1124 LV: SJM 1458Q Quartet, SN [id number], DOI 08/27/19  Device/leads extracted:  CRTD: Abbott [id number] Gallant HF, DOI 08/27/19, DOE 04/29/24 RV: SJM 2877V Rico MADRID, SN [id number], DOI 08/27/19, DOE 04/29/24  Lead Interrogation Data: RA: 2.9 mV / 460 ohms / 0.5 V @ 0.5 ms RV: >12.0 mV / 500 ohms / 0.5 V @ 0.5 ms Shock Impedance: 71 ohms  Final Device Settings: DDD 60/120 PAV 120 SAV 110   Summary: 1. Extraction of RV-ICD lead  2. RV-ICD lead implant  3. CRTD generator replacement   Recommendations: Routine post-procedure care with bedrest for 3 hours No heparin  (IV or subcutaneous) for 48 hours. No enoxaparin (IV or subcutaneous) for 7 days.  PA/lateral CXR in AM Wound check in AM Device interrogation in AM  Donnice DELENA Primus,  MD [facility]  Cardiac Electrophysiology

## 2024-04-29 NOTE — Anesthesia Preprocedure Evaluation (Addendum)
 Anesthesia Evaluation  Patient identified by MRN, date of birth, ID band Patient awake    Reviewed: Allergy & Precautions, NPO status , Patient's Chart, lab work & pertinent test results, reviewed documented beta blocker date and time   Airway Mallampati: II  TM Distance: >3 FB Neck ROM: Full    Dental  (+) Dental Advisory Given, Poor Dentition, Chipped, Missing   Pulmonary former smoker   Pulmonary exam normal breath sounds clear to auscultation       Cardiovascular hypertension, Pt. on home beta blockers and Pt. on medications + CAD, + Past MI and +CHF  Normal cardiovascular exam(-) pacemaker+ Cardiac Defibrillator  Rhythm:Regular Rate:Normal     Neuro/Psych negative neurological ROS     GI/Hepatic Neg liver ROS,GERD  Medicated,,  Endo/Other  negative endocrine ROS    Renal/GU negative Renal ROS     Musculoskeletal negative musculoskeletal ROS (+)    Abdominal   Peds  Hematology negative hematology ROS (+)   Anesthesia Other Findings Day of surgery medications reviewed with the patient.  Reproductive/Obstetrics                              Anesthesia Physical Anesthesia Plan  ASA: 4  Anesthesia Plan: General   Post-op Pain Management: Tylenol  PO (pre-op)*   Induction: Intravenous  PONV Risk Score and Plan: 3 and Dexamethasone and Ondansetron   Airway Management Planned: Oral ETT  Additional Equipment: Arterial line and TEE  Intra-op Plan:   Post-operative Plan: Extubation in OR  Informed Consent: I have reviewed the patients History and Physical, chart, labs and discussed the procedure including the risks, benefits and alternatives for the proposed anesthesia with the patient or authorized representative who has indicated his/her understanding and acceptance.     Dental advisory given  Plan Discussed with: CRNA  Anesthesia Plan Comments: (2 large bore PIV vs CVL)          Anesthesia Quick Evaluation

## 2024-04-29 NOTE — Interval H&P Note (Signed)
 History and Physical Interval Note:  04/29/2024 12:52 PM  [name]  has presented today for surgery, with the diagnosis of lead malfunction.  The various methods of treatment have been discussed with the patient and family. After consideration of risks, benefits and other options for treatment, the patient has consented to  Procedures: LEAD EXTRACTION (N/A) LEAD INSERTION (N/A) BIV ICD GENERATOR CHANGEOUT (N/A) as a surgical intervention.  The patient's history has been reviewed, patient examined, no change in status, stable for surgery.  I have reviewed the patient's chart and labs.  Questions were answered to the patient's satisfaction.     Donnice DELENA Primus

## 2024-04-29 NOTE — Anesthesia Postprocedure Evaluation (Signed)
 Anesthesia Post Note  Patient: Ann Bernard  Procedure(s) Performed: LEAD EXTRACTION LEAD INSERTION BIV ICD GENERATOR CHANGEOUT     Patient location during evaluation: Cath Lab Anesthesia Type: General Level of consciousness: awake Pain management: pain level controlled Vital Signs Assessment: post-procedure vital signs reviewed and stable Respiratory status: spontaneous breathing Cardiovascular status: blood pressure returned to baseline Postop Assessment: no apparent nausea or vomiting Anesthetic complications: no   There were no known notable events for this encounter.  Last Vitals:  Vitals:   04/29/24 1700 04/29/24 1715  BP:    Pulse: 60 61  Resp: 13 16  Temp:    SpO2: 100% 99%    Last Pain:  Vitals:   04/29/24 1615  TempSrc:   PainSc: 0-No pain                 [name]

## 2024-04-29 NOTE — H&P (Signed)
 Date: 04/29/24 ID:  Ann Bernard, DOB [birth year], MRN [id number] PCP: Duwayne Katheryn HERO, PA  [facility] Providers Cardiologist:  None Electrophysiologist:  Will Gladis Norton, MD  Advanced Heart Failure:  Ezra Shuck, MD    History of Present Illness Ann Bernard is a 78 y.o. female with HFrEF s/p LEFT Abbott CRTD (DOI 08/27/19, Dr. Norton), HTN, HLD, and tobacco abuse who presents for lead management.    Remote monitoring with RV lead noise and decrease in BiV pacing (94%->74%) since recent device check.  Evaluated in clinic and isometrics initially did not reveal any abnormalities however subsequently there was 1 event observed which was captured and showed oversensing of RV lead noise consistent with likely fracture.  Pacing and tachy therapies were programmed off and the mode was switched to DDI with LRL 50.   Today she presents back to clinic with her son.  She has no complaints.  Device interrogation with isometrics did not reveal recurrent RV lead noise initially however RV lead capture was intermittent at max output and with noise during pacing.  She is not device dependent.   ROS: no complaints    Studies Reviewed   ECG review 02/26/24: AS-BVP 66, PR 146, QRS 130, QT/c 428/448, intermittent fusion, several intrinsic complexes with ventricular oversensing  08/15/23: AS-BVP 73, PR 128, QRS 128, QT/c 428/471, isolated PAC 06/25/23: AS-BVP 68, PR 156, QRS 140, QT/c 409/435   TTE Result date: 09/01/23  1. Left ventricular ejection fraction, by estimation, is 45 to 50%. The  left ventricle has mildly decreased function. The left ventricle  demonstrates global hypokinesis. Left ventricular diastolic parameters are  consistent with Grade I diastolic  dysfunction (impaired relaxation).   2. Right ventricular systolic function is mildly reduced. The right  ventricular size is normal.   3. The mitral valve is grossly normal. Mild mitral valve regurgitation.  No evidence  of mitral stenosis.   4. The aortic valve is tricuspid. There is mild calcification of the  aortic valve. Aortic valve regurgitation is mild. Aortic valve sclerosis  is present, with no evidence of aortic valve stenosis.    TTE Result date: 12/02/19  1. Left ventricular ejection fraction, by estimation, is 50%. The left  ventricle has mildly decreased function. The left ventricle demonstrates  global hypokinesis. There is mild left ventricular hypertrophy. Left  ventricular diastolic parameters are  consistent with Grade I diastolic dysfunction (impaired relaxation).   2. Right ventricular systolic function is normal. The right ventricular  size is normal. There is normal pulmonary artery systolic pressure. The  estimated right ventricular systolic pressure is 20.1 mmHg.   3. Left atrial size was mildly dilated.   4. The mitral valve is normal in structure. No evidence of mitral valve  regurgitation. No evidence of mitral stenosis.   5. The aortic valve is tricuspid. Aortic valve regurgitation is trivial.  Mild aortic valve sclerosis is present, with no evidence of aortic valve  stenosis.   6. The inferior vena cava is normal in size with greater than 50%  respiratory variability, suggesting right atrial pressure of 3 mmHg.    TTE  Result date: 06/25/19  1. Left ventricular ejection fraction, by visual estimation, is 25 to  30%. The left ventricle has normal function. There is no left ventricular  hypertrophy.   2. Abnormal septal motion consistent with left bundle branch block.   3. Left ventricular diastolic parameters are consistent with Grade I  diastolic dysfunction (impaired relaxation).   4.  The left ventricle demonstrates global hypokinesis.   5. Global right ventricle has normal systolic function.The right  ventricular size is normal.   6. Left atrial size was normal.   7. Right atrial size was normal.   8. The mitral valve is normal in structure. Trivial mitral valve   regurgitation. No evidence of mitral stenosis.   9. The tricuspid valve is normal in structure. Tricuspid valve  regurgitation is trivial.  10. The aortic valve is tricuspid. Aortic valve regurgitation is mild.  Mild aortic valve sclerosis without stenosis.  11. The pulmonic valve was not well visualized. Pulmonic valve  regurgitation is not visualized.  12. The inferior vena cava is normal in size with greater than 50%  respiratory variability, suggesting right atrial pressure of 3 mmHg.  13. Severe global reduction in LV systolic function; grade 1 diastolic  dysfunction; mild AI.    Physical Exam VS:  BP 132/83   Pulse 73   Ht 5' 7 (1.702 m)   Wt 165 lb (74.8 kg)   BMI 25.84 kg/m          Wt Readings from Last 3 Encounters:  03/24/24 165 lb (74.8 kg)  02/26/24 170 lb (77.1 kg)  01/15/24 169 lb 12.8 oz (77 kg)    GEN: Well nourished, well developed in no acute distress NECK: No JVD; No carotid bruits CARDIAC: RRR, no murmurs, rubs, gallops RESPIRATORY:  Clear to auscultation without rales, wheezing or rhonchi  ABDOMEN: Soft, non-tender, non-distended EXTREMITIES:  No edema; No deformity  SKIN: LEFT infraclavicular incision well healed without erythema or swelling   Device information CRTD: Abbott [id number] Gallant HF, DOI 08/27/19 RA: SJM [id number] Tendril MRI, SN [id number], DOI 08/27/19 RV: SJM 2877V Rico MADRID, SN [id number], DOI 08/27/19 LV: SJM 1458Q Quartet, SN [id number], DOI 08/27/19   ASSESSMENT AND PLAN Ann Bernard is a 78 y.o. female with HFrEF s/p LEFT Abbott CRTD (DOI 08/27/19, Dr. Inocencio), HTN, HLD, and tobacco abuse who presents for lead management.    HFrEF/NICM LEFT Abbott CRTD RV-d lead noise RV-d lead threshold increase Repeat device interrogation today without any lead noise with isometric maneuvers initially.  RV pacing however with intermittent capture with max output and abnormal noise with RV pacing.  We reviewed the risk, benefits and  alternatives of RV-d lead extraction and replacement including but not limited to device site pain, infection, damage to the brachiocephalic/SVC, damage to the lung requiring drain, damage to the tricuspid valve requiring repair/replacement, damage to the heart wall/perforation requiring emergency drain and/or median sternotomy, life-threatening rhythms and death.  Alternatives to this would be placing a new RV-D lead with abandonment of the pre-existing lead.  There is no way to program around the lead fracture.  With the leads only 78 years old I think the risk of brachiocephalic/SVC laceration and/or cardiac perforation is less than 0.5%.  She and her son understand these risk and would like to proceed with extraction of the fractured RV-D lead and replacement.  Will coordinate with Dr. Kennyth and/or Dr. Waddell to assist in the procedure.  Will also need to coordinate for surgical backup.   Pre Procedure planning:  Schedule lead extraction of fractured RV-d lead with replacement  Plan to replace CRTD generator at the same time (Longevity 2.1-3.2 years) to minimize repeat procedures  All medications can be given the day prior to the procedure, hold all OP AM meds the day of the procedure  She is not on any OAC Will need to coordinate  planning with assistance from Dr. Kennyth and/or Dr. Waddell to be present along with CTS backup  No pre procedural imaging required   Dispo: RTC with Dr. Inocencio

## 2024-04-29 NOTE — Transfer of Care (Signed)
 Immediate Anesthesia Transfer of Care Note  Patient: Ann Bernard  Procedure(s) Performed: LEAD EXTRACTION LEAD INSERTION BIV ICD GENERATOR CHANGEOUT  Patient Location: PACU and Cath Lab  Anesthesia Type:General  Level of Consciousness: awake, alert , patient cooperative, and confused  Airway & Oxygen Therapy: Patient Spontanous Breathing and Patient connected to nasal cannula oxygen  Post-op Assessment: Report given to RN, Post -op Vital signs reviewed and stable, and Patient moving all extremities  Post vital signs: Reviewed and stable  Last Vitals:  Vitals Value Taken Time  BP 169/111 04/29/24 16:15  Temp    Pulse 62 04/29/24 16:16  Resp 20 04/29/24 16:16  SpO2 100 % 04/29/24 16:16  Vitals shown include unfiled device data.  Last Pain:  Vitals:   04/29/24 1222  TempSrc: Oral  PainSc: 0-No pain         Complications: There were no known notable events for this encounter.

## 2024-04-30 ENCOUNTER — Observation Stay (HOSPITAL_COMMUNITY)

## 2024-04-30 ENCOUNTER — Encounter (HOSPITAL_COMMUNITY): Payer: Self-pay | Admitting: Student in an Organized Health Care Education/Training Program

## 2024-04-30 DIAGNOSIS — I5022 Chronic systolic (congestive) heart failure: Secondary | ICD-10-CM | POA: Diagnosis not present

## 2024-04-30 LAB — ECHO INTRAOPERATIVE TEE

## 2024-04-30 MED ADMIN — Acetaminophen Tab 325 MG: 975 mg | ORAL | NDC 50580045811

## 2024-04-30 MED FILL — Fentanyl Citrate Preservative Free (PF) Inj 100 MCG/2ML: INTRAMUSCULAR | Qty: 4 | Status: AC

## 2024-04-30 NOTE — Discharge Summary (Signed)
 ELECTROPHYSIOLOGY PROCEDURE DISCHARGE SUMMARY    Patient ID: Ann Bernard,  MRN: [id number], DOB/AGE: 06-11-1945 78 y.o.  Admit date: 04/29/2024 Discharge date: 04/30/2024  Primary Care Physician: Duwayne Katheryn HERO, PA  Primary Cardiologist: None  Electrophysiologist: Dr. Almetta    Primary Diagnosis:  Chronic Systolic Heart Failure / NICM with LVEF recover with CRT Fractured RV ICD Lead  Secondary Diagnosis: CAD  HTN  Allergies[1]   Procedures This Admission:  Extraction of an Abbott RV-ICD lead 2.  Implantation of a Abbott BiV ICD on 04/29/24 by Dr. Almetta.  The patient received a Abbot Gallant [id number] (BiV)  with pre-existing RA / CS lead (leads as below). New RV lead implanted > Durata 7122Q-65 cm 04/29/24. DFTs were deferred at time of implant There were no post procedure complications 3.  CXR on 04/30/24 demonstrated no pneumothorax status post device implantation.   Device information CRTD: Abbott [id number] [place] HF, [place] [id number], DOI 04/29/24 RA: SJM LPA1200M Tendril MRI, SN [id number], DOI 08/27/19 RV: SJM 7122Q Rico MADRID, SN [id number], DOI 04/29/24 LV: SJM 1458Q [place], [place] [id number], DOI 08/27/19      Brief HPI:  Ann Bernard is a 78 y.o. female with PMH of with HFrEF s/p LEFT Abbott CRTD (DOI 08/27/19, Dr. Inocencio), HTN, HLD, and tobacco abuse was referred to electrophysiology in the outpatient setting for extraction of ICD due to RV lead noise consistent with likely fracture.  Past medical history includes above. Risks, benefits, and alternatives to ICD extraction were reviewed with the patient who wished to proceed.   Hospital Course:  The patient was admitted and extraction of RV lead was completed on 04/29/24 by Dr. Ami. Waddell.  The pt then underwent implantation of a Abbott RV lead with details as outlined above.  They were monitored on telemetry overnight which demonstrated appropriate pacing.  Left chest was without hematoma or  ecchymosis.  The device was interrogated and found to be functioning normally.  CXR was obtained and demonstrated no pneumothorax status post device implantation..  Wound care, arm mobility, and restrictions were reviewed with the patient.  The patient was examined and considered stable for discharge to home. Device check on am of discharge within norma limits > A: Impedance 440 ohms, Sensing  The patient's discharge medications include an ACE-I/ARB/ARNI (Entresto ) and beta blocker (Coreg ).      Anticoagulation resumption This patient is not on anticoagulation.  Physical Exam: Vitals:   04/29/24 2300 04/30/24 0000 04/30/24 0356 04/30/24 0753  BP: 137/67 (!) 163/66 (!) 141/72 121/70  Pulse: 67 72 60 71  Resp: 20 20 16 17   Temp: 97.9 F (36.6 C)  97.6 F (36.4 C) 97.7 F (36.5 C)  TempSrc: Oral  Oral Oral  SpO2: 95% 95% 97% 95%  Weight:      Height:        GEN- NAD. A&O x 3.  HEENT: Normocephalic, atraumatic Lungs- CTAB, normal effort.  Heart- RRR. No M/G/R.  GI- Soft, NT, ND.  Extremities- No clubbing, cyanosis, or edema Skin- Warm and dry, no rash or lesion. ICD site clean / dry, no significant bruising, no hematoma  Discharge Medications:  Allergies as of 04/30/2024       Reactions   Aspirin  Nausea Only, Other (See Comments)   GI Upset, stomach cramps        Medication List     TAKE these medications    alendronate 70 MG tablet Commonly known as: FOSAMAX Take 70 mg by mouth once a week.  Every Monday   atorvastatin  40 MG tablet Commonly known as: LIPITOR Take 1 tablet (40 mg total) by mouth every evening.   carvedilol  12.5 MG tablet Commonly known as: COREG  Take 1 tablet (12.5 mg total) by mouth 2 (two) times daily with a meal.   cyclobenzaprine  10 MG tablet Commonly known as: FLEXERIL  Take 1 tablet (10 mg total) by mouth 2 (two) times daily as needed for muscle spasms.   diphenhydrAMINE 25 MG tablet Commonly known as: SOMINEX Take 1 tablet by mouth  daily as needed for itching.   Entresto  24-26 MG Generic drug: sacubitril -valsartan  Take 1 tablet by mouth 2 (two) times daily.   famotidine  20 MG tablet Commonly known as: PEPCID  Take 20 mg by mouth 2 (two) times daily.   Farxiga  10 MG Tabs tablet Generic drug: dapagliflozin  propanediol Take 1 tablet (10 mg total) by mouth daily before breakfast.   furosemide  20 MG tablet Commonly known as: LASIX  Take 1 tablet (20 mg total) by mouth daily.   potassium chloride  SA 20 MEQ tablet Commonly known as: KLOR-CON  M Take 1 tablet (20 mEq total) by mouth daily.   PRESERVISION AREDS 2 PO Take 1 capsule by mouth daily.   spironolactone  25 MG tablet Commonly known as: ALDACTONE  Take 1 tablet (25 mg total) by mouth daily.        Disposition: Home with usual follow up as in AVS  Duration of Discharge Encounter:  APP time: 31 minutes    Signed, Daphne Barrack, NP-C, AGACNP-BC [facility] - Electrophysiology  04/30/2024, 9:44 AM         [1]  Allergies Allergen Reactions   Aspirin  Nausea Only and Other (See Comments)    GI Upset, stomach cramps

## 2024-04-30 NOTE — Discharge Instructions (Addendum)
 After Your ICD (Implantable Cardiac Defibrillator)   You have a Abbott ICD  If you have a Medtronic or Biotronik device, plug in your home monitor once you get home, and no manual interaction is required.   If you have an Abbott or Autozone device, plug your home monitor once you get home, sit near the device, and press the large activation button. Sit nearby until the process is complete, usually notated by lights on the monitor.   If you were set up for monitoring using an app on your phone, make sure the app remains open in the background and the Bluetooth remains on.  ACTIVITY Do not lift your arm above shoulder height for 1 week after your procedure. After 7 days, you may progress as below.  You should remove your sling 24 hours after your procedure, unless otherwise instructed by your provider.     Friday May 07, 2024  Saturday May 08, 2024 Sunday May 09, 2024 Monday May 10, 2024   Do not lift, push, pull, or carry anything over 10 pounds with the affected arm until 6 weeks (Friday June 11, 2024 ) after your procedure.   You may drive AFTER your wound check, unless you have been told otherwise by your provider.   Ask your healthcare provider when you can go back to work   INCISION/Dressing If you are on a blood thinner such as Coumadin, Xarelto, Eliquis, Plavix, or Pradaxa please confirm with your provider when this should be resumed.   If large square, outer bandage is left in place, this can be removed after 24 hours from your procedure. Do not remove steri-strips or glue as below.   Monitor your defibrillator site for redness, swelling, and drainage. Call the device clinic at [phone] if you experience these symptoms or fever/chills.    If your incision is sealed with Dermabond (glue), you may shower 24 hrs after your procedure or when told by your provider. Do not remove the glue or let the shower hit directly on your site. You may wash  around your site with soap and water.    If you were discharged in a sling, please do not wear this during the day more than 48 hours after your surgery unless otherwise instructed. This may increase the risk of stiffness and soreness in your shoulder.   Avoid lotions, ointments, or perfumes over your incision until it is well-healed.  You may use a hot tub or a pool AFTER your wound check appointment if the incision is completely closed.  Your ICD is designed to protect you from life threatening heart rhythms. Because of this, you may receive a shock.   1 shock with no symptoms:  Call the office during business hours. 1 shock with symptoms (chest pain, chest pressure, dizziness, lightheadedness, shortness of breath, overall feeling unwell):  Call 911. If you experience 2 or more shocks in 24 hours:  Call 911. If you receive a shock, you should not drive for 6 months per the [place] DMV IF you receive appropriate therapy from your ICD.   ICD Alerts:  Some alerts are vibratory and others beep. These are NOT emergencies. Please call our office to let us  know. If this occurs at night or on weekends, it can wait until the next business day. Send a remote transmission.  If your device is capable of reading fluid status (for heart failure), you will be offered monthly monitoring to review this with you.   DEVICE MANAGEMENT Remote  monitoring is used to monitor your ICD from home. This monitoring is scheduled every 91 days by our office. It allows us  to keep an eye on the functioning of your device to ensure it is working properly. You will routinely see your Electrophysiologist annually (more often if necessary). This will appear as a REMOTE check on your MyChart schedule. These are automatic and there is nothing for you to manually do unless otherwise instructed.  You should receive your ID card for your new device in 4-8 weeks. Keep this card with you at all times once received. Consider wearing a  medical alert bracelet or necklace.  Your ICD  may be MRI compatible. This will be discussed at your next office visit/wound check.  You should avoid contact with strong electric or magnetic fields.   Do not use amateur (ham) radio equipment or electric (arc) welding torches. MP3 player headphones with magnets should not be used. Some devices are safe to use if held at least 12 inches (30 cm) from your defibrillator. These include power tools, lawn mowers, and speakers. If you are unsure if something is safe to use, ask your health care provider.  When using your cell phone, hold it to the ear that is on the opposite side from the defibrillator. Do not leave your cell phone in a pocket over the defibrillator.  You may safely use electric blankets, heating pads, computers, and microwave ovens.  Call the office right away if: You have chest pain. You feel more than one shock. You feel more short of breath than you have felt before. You feel more light-headed than you have felt before. Your incision starts to open up.  This information is not intended to replace advice given to you by your health care provider. Make sure you discuss any questions you have with your health care provider.

## 2024-04-30 NOTE — Care Management (Signed)
°  Transition of Care [facility]) Screening Note   Patient Details  Name: Ann Bernard Date of Birth: [birth year]   Transition of Care [facility]) CM/SW Contact:    Corean JAYSON Canary, RN Phone Number: 04/30/2024, 8:29 AM    Transition of Care Department [facility]) has reviewed patient and no TOC needs have been identified at this time. We will continue to monitor patient advancement through interdisciplinary progression rounds. If new patient transition needs arise, please place a TOC consult.

## 2024-05-01 LAB — BPAM RBC
Blood Product Expiration Date: 202601052359
Blood Product Expiration Date: 202601052359
Blood Product Expiration Date: 202601052359
Blood Product Expiration Date: 202601052359
ISSUE DATE / TIME: 202512111352
ISSUE DATE / TIME: 202512111352
Unit Type and Rh: 7300
Unit Type and Rh: 7300
Unit Type and Rh: 7300
Unit Type and Rh: 7300

## 2024-05-01 LAB — TYPE AND SCREEN
ABO/RH(D): B POS
Antibody Screen: NEGATIVE
Unit division: 0
Unit division: 0
Unit division: 0
Unit division: 0

## 2024-05-07 ENCOUNTER — Telehealth: Payer: Self-pay

## 2024-05-07 NOTE — Telephone Encounter (Signed)
 Received voice mail message stating she is confirming she will be at her office appointment on 05/10/2024.  Sent to Device clinic as FYI.

## 2024-05-10 ENCOUNTER — Ambulatory Visit: Attending: Cardiology

## 2024-05-10 DIAGNOSIS — I428 Other cardiomyopathies: Secondary | ICD-10-CM | POA: Diagnosis not present

## 2024-05-10 LAB — CUP PACEART INCLINIC DEVICE CHECK
Date Time Interrogation Session: 20251222132921
Implantable Lead Connection Status: 753985
Implantable Lead Connection Status: 753985
Implantable Lead Connection Status: 753985
Implantable Lead Implant Date: 20210409
Implantable Lead Implant Date: 20210409
Implantable Lead Implant Date: 20210409
Implantable Lead Location: 753858
Implantable Lead Location: 753859
Implantable Lead Location: 753860
Implantable Pulse Generator Implant Date: 20210409
Pulse Gen Serial Number: 111006673

## 2024-05-10 NOTE — Patient Instructions (Signed)
" ° °  After Your ICD (Implantable Cardiac Defibrillator)    Monitor your defibrillator site for redness, swelling, and drainage. Call the device clinic at [phone] if you experience these symptoms or fever/chills.  Your incision was closed with Dermabond:  You may shower 1 day after your defibrillator implant and wash your incision with soap and water. Avoid lotions, ointments, or perfumes over your incision until it is well-healed.  You may use a hot tub or a pool after your wound check appointment if the incision is completely closed.  Do not lift, push or pull greater than 10 pounds with the affected arm until 6 weeks after your procedure. UNTIL AFTER JANUARY 22ND. There are no other restrictions in arm movement after your wound check appointment.  Your ICD is designed to protect you from life threatening heart rhythms. Because of this, you may receive a shock.   1 shock with no symptoms:  Call the office during business hours. 1 shock with symptoms (chest pain, chest pressure, dizziness, lightheadedness, shortness of breath, overall feeling unwell):  Call 911. If you experience 2 or more shocks in 24 hours:  Call 911. If you receive a shock, you should not drive.  [place] DMV - no driving for 6 months if you receive appropriate therapy from your ICD.   ICD Alerts:  Some alerts are vibratory and others beep. These are NOT emergencies. Please call our office to let us  know. If this occurs at night or on weekends, it can wait until the next business day. Send a remote transmission.  If your device is capable of reading fluid status (for heart failure), you will be offered monthly monitoring to review this with you.   Remote monitoring is used to monitor your ICD from home. This monitoring is scheduled every 91 days by our office. It allows us  to keep an eye on the functioning of your device to ensure it is working properly. You will routinely see your Electrophysiologist annually (more often  if necessary).  "

## 2024-05-10 NOTE — Progress Notes (Signed)
 Normal CRT-D chamber ICD wound check. Wound well healed. Presenting rhythm: AP/BVP 60 . Routine testing performed. Thresholds, sensing, and impedance consistent with implant measurements with 3.5V safety margin/auto capture until 3 month visit. No treated arrhythmias. Reviewed arm restrictions to continue for 6 weeks total post op. Reviewed shock plan.  Pt enrolled in remote follow-up.  Patient reporting stim when lying on right side and visible stim present while sitting in chair during interrogation today. LV initial programming upon interrogation today: Mid2-RVcoil with pacing threshold 2.25@0 .5ms. (stim present).  RV pacing threshold: .5v@.5ms (Cap Confirm is on).  With isolated pacing test patient thought she was feeling same stim as well.    Vector testing performed.  No phrenic nerve stim with programming Distal tip 1 to 4.  LV pacing threshold measured 2.0v@.52ms.  Isolated RV pacing threshold performed, unable to reproduce reported feelings of stim even at 7.5v.    PROGRAMMING CHANGE: LV - mid2-RVcoil vector changed to Distal tip 1-4.  LV - programmed output - Cap Confirm on with extended pulse width of 0.26ms (testing performed with patient lying flat/right side.  Unable to reproduce stim with vector change).  Patient to call if any re-occurring symptoms. Continue to monitor.

## 2024-05-14 ENCOUNTER — Ambulatory Visit: Payer: Self-pay | Admitting: Student in an Organized Health Care Education/Training Program

## 2024-05-17 ENCOUNTER — Other Ambulatory Visit (HOSPITAL_COMMUNITY): Payer: Self-pay

## 2024-05-18 ENCOUNTER — Emergency Department (HOSPITAL_BASED_OUTPATIENT_CLINIC_OR_DEPARTMENT_OTHER)

## 2024-05-18 ENCOUNTER — Other Ambulatory Visit (HOSPITAL_COMMUNITY): Payer: Self-pay

## 2024-05-18 ENCOUNTER — Other Ambulatory Visit: Payer: Self-pay

## 2024-05-18 ENCOUNTER — Emergency Department (HOSPITAL_BASED_OUTPATIENT_CLINIC_OR_DEPARTMENT_OTHER): Admission: EM | Admit: 2024-05-18 | Discharge: 2024-05-18 | Disposition: A | Discharge status: Home / Self Care

## 2024-05-18 ENCOUNTER — Encounter (HOSPITAL_BASED_OUTPATIENT_CLINIC_OR_DEPARTMENT_OTHER): Payer: Self-pay | Admitting: Emergency Medicine

## 2024-05-18 DIAGNOSIS — I110 Hypertensive heart disease with heart failure: Secondary | ICD-10-CM | POA: Insufficient documentation

## 2024-05-18 DIAGNOSIS — R0789 Other chest pain: Secondary | ICD-10-CM | POA: Insufficient documentation

## 2024-05-18 DIAGNOSIS — I509 Heart failure, unspecified: Secondary | ICD-10-CM | POA: Insufficient documentation

## 2024-05-18 DIAGNOSIS — Z95810 Presence of automatic (implantable) cardiac defibrillator: Secondary | ICD-10-CM | POA: Diagnosis not present

## 2024-05-18 DIAGNOSIS — R079 Chest pain, unspecified: Secondary | ICD-10-CM | POA: Diagnosis present

## 2024-05-18 DIAGNOSIS — Z79899 Other long term (current) drug therapy: Secondary | ICD-10-CM | POA: Insufficient documentation

## 2024-05-18 LAB — TROPONIN T, HIGH SENSITIVITY
Troponin T High Sensitivity: <15 ng/L (ref 0–19)
Troponin T High Sensitivity: <15 ng/L (ref 0–19)

## 2024-05-18 LAB — CBC
HCT: 39.7 % (ref 36.0–46.0)
Hemoglobin: 13.6 g/dL (ref 12.0–15.0)
MCH: 33.7 pg (ref 26.0–34.0)
MCHC: 34.3 g/dL (ref 30.0–36.0)
MCV: 98.3 fL (ref 80.0–100.0)
Platelets: 148 K/uL — ABNORMAL LOW (ref 150–400)
RBC: 4.04 MIL/uL (ref 3.87–5.11)
RDW: 13.2 % (ref 11.5–15.5)
WBC: 5.7 K/uL (ref 4.0–10.5)
nRBC: 0 % (ref 0.0–0.2)

## 2024-05-18 LAB — BASIC METABOLIC PANEL WITH GFR
Anion gap: 10 (ref 5–15)
BUN: 12 mg/dL (ref 8–23)
CO2: 25 mmol/L (ref 22–32)
Calcium: 9.1 mg/dL (ref 8.9–10.3)
Chloride: 108 mmol/L (ref 98–111)
Creatinine, Ser: 0.86 mg/dL (ref 0.44–1.00)
GFR, Estimated: >60 mL/min
Glucose, Bld: 109 mg/dL — ABNORMAL HIGH (ref 70–99)
Potassium: 3.6 mmol/L (ref 3.5–5.1)
Sodium: 143 mmol/L (ref 135–145)

## 2024-05-18 MED ORDER — FAMOTIDINE 20 MG PO TABS
20.0000 mg | ORAL_TABLET | Freq: Two times a day (BID) | ORAL | 0 refills | Status: AC
  Filled 2024-05-18 (×2): qty 30, 15d supply, fill #0

## 2024-05-18 MED ORDER — FAMOTIDINE 20 MG PO TABS
20.0000 mg | ORAL_TABLET | Freq: Once | ORAL | Status: AC
  Administered 2024-05-18: 20 mg via ORAL
  Filled 2024-05-18: qty 1

## 2024-05-18 NOTE — ED Triage Notes (Signed)
 Pt c/o chest pain upon waking this AM. Denies shob, nausea, diaphoresis.   Recent lead replacement for icd on 04/29/24.  Denies concern for infection.

## 2024-05-18 NOTE — ED Provider Notes (Signed)
 " [place] EMERGENCY DEPARTMENT AT [facility] Provider Note   CSN: [id number] Arrival date & time: 05/18/24  1230     Patient presents with: Chest Pain   Ann Bernard is a 78 y.o. female.  78 year old female presents with chest pain.  Patient has significant history of heart failure with reduced ejection fraction, right ventricle ICD, hypertension, hyperlipidemia.  Patient recently had surgery to replace her ICD.  Patient reports this morning around 6 AM when she woke up she had chest pressure about 10 out of 10.  Patient reports that the pressure does not radiate or exacerbate with movement or eating/drinking.  It is not reproducible to palpation.  She reports it has eased up since arriving to the emergency department.  She advises she has had these symptoms in the past and is taking famotidine  with relief.  She advises the famotidine  that she has at home has expired and she did not take any this morning.  She has been able to drink orange juice prior to arrival without exacerbation of symptoms.  Patient reports taking Lasix , carvedilol , famotidine , potassium, and Crestor, Aldactone .  Patient denies nausea, vomiting, weakness, shortness of breath, dizziness, headache, visual disturbances.     Prior to Admission medications  Medication Sig Start Date End Date Taking? Authorizing Provider  famotidine  (PEPCID ) 20 MG tablet Take 1 tablet (20 mg total) by mouth 2 (two) times daily. 05/18/24  Yes Myriam Fonda RAMAN, PA-C  alendronate (FOSAMAX) 70 MG tablet Take 70 mg by mouth once a week. Every Monday 04/26/19   [provider]  atorvastatin  (LIPITOR) 40 MG tablet Take 1 tablet (40 mg total) by mouth every evening. 01/15/24 01/14/25  Hayes Beckey CROME, NP  carvedilol  (COREG ) 12.5 MG tablet Take 1 tablet (12.5 mg total) by mouth 2 (two) times daily with a meal. 01/15/24   Hayes Beckey CROME, NP  cyclobenzaprine  (FLEXERIL ) 10 MG tablet Take 1 tablet (10 mg total) by mouth 2 (two) times daily as  needed for muscle spasms. 05/31/22   Dreama Longs, MD  dapagliflozin  propanediol (FARXIGA ) 10 MG TABS tablet Take 1 tablet (10 mg total) by mouth daily before breakfast. 06/24/22   Rolan Ezra RAMAN, MD  diphenhydrAMINE (SOMINEX) 25 MG tablet Take 1 tablet by mouth daily as needed for itching.    [provider]  famotidine  (PEPCID ) 20 MG tablet Take 20 mg by mouth 2 (two) times daily.    [provider]  furosemide  (LASIX ) 20 MG tablet Take 1 tablet (20 mg total) by mouth daily. 12/09/23 07/04/24  Rolan Ezra RAMAN, MD  Multiple Vitamins-Minerals (PRESERVISION AREDS 2 PO) Take 1 capsule by mouth daily.    [provider]  potassium chloride  SA (KLOR-CON  M) 20 MEQ tablet Take 1 tablet (20 mEq total) by mouth daily. 12/01/23   Rolan Ezra RAMAN, MD  sacubitril -valsartan  (ENTRESTO ) 24-26 MG Take 1 tablet by mouth 2 (two) times daily. 02/02/24   Rolan Ezra RAMAN, MD  spironolactone  (ALDACTONE ) 25 MG tablet Take 1 tablet (25 mg total) by mouth daily. 01/15/24   Hayes Beckey CROME, NP    Allergies: Aspirin     Review of Systems  Cardiovascular:  Positive for chest pain.  All other systems reviewed and are negative.   Updated Vital Signs BP 132/78   Pulse 64   Temp 98.2 F (36.8 C) (Oral)   Resp 19   Ht 5' 7 (1.702 m)   SpO2 100%   BMI 26.97 kg/m   Physical Exam Vitals  and nursing note reviewed.  Constitutional:      General: She is not in acute distress.    Appearance: Normal appearance. She is well-developed. She is not ill-appearing or toxic-appearing.  HENT:     Head: Normocephalic and atraumatic.     Nose: Nose normal.     Mouth/Throat:     Mouth: Mucous membranes are moist.  Eyes:     Extraocular Movements: Extraocular movements intact.     Conjunctiva/sclera: Conjunctivae normal.     Pupils: Pupils are equal, round, and reactive to light.  Cardiovascular:     Rate and Rhythm: Normal rate.     Heart sounds: Normal heart sounds.     Comments: No pain to  palpation of chest wall there is some tenderness over the recent insertion site of the ICD.  No signs of infection and surgical wounds appear to be healing appropriately. Pulmonary:     Effort: Pulmonary effort is normal. No respiratory distress.     Breath sounds: Normal breath sounds.     Comments: Lung sounds clear to auscultation all fields Abdominal:     General: Abdomen is flat.     Palpations: Abdomen is soft.     Tenderness: There is no abdominal tenderness. There is no guarding.  Musculoskeletal:        General: Normal range of motion.     Cervical back: Normal range of motion.     Right lower leg: No tenderness. No edema.     Left lower leg: No tenderness. No edema.  Skin:    General: Skin is warm.     Capillary Refill: Capillary refill takes less than 2 seconds.     Coloration: Skin is not cyanotic.  Neurological:     General: No focal deficit present.     Mental Status: She is alert.  Psychiatric:        Mood and Affect: Mood normal.        Behavior: Behavior normal.     (all labs ordered are listed, but only abnormal results are displayed) Labs Reviewed  BASIC METABOLIC PANEL WITH GFR - Abnormal; Notable for the following components:      Result Value   Glucose, Bld 109 (*)    All other components within normal limits  CBC - Abnormal; Notable for the following components:   Platelets 148 (*)    All other components within normal limits  TROPONIN T, HIGH SENSITIVITY  TROPONIN T, HIGH SENSITIVITY    EKG: EKG Interpretation Date/Time:  Tuesday May 18 2024 12:58:07 EST Ventricular Rate:  60 PR Interval:  116 QRS Duration:  142 QT Interval:  448 QTC Calculation: 448 R Axis:   232  Text Interpretation: Sinus or ectopic atrial rhythm Ventricular tachycardia, unsustained Borderline short PR interval Right bundle branch block Probable lateral infarct, old Confirmed by Neysa Clap [phone]) on 05/18/2024 3:25:29 PM  Radiology: DG Chest 2 View Result Date:  05/18/2024 CLINICAL DATA:  Chest pain. EXAM: CHEST - 2 VIEW COMPARISON:  Chest radiograph dated 04/30/2024. FINDINGS: No focal consolidation, pleural effusion, or pneumothorax. The cardiac silhouette is within limits. Left pectoral pacemaker device. No acute osseous pathology. IMPRESSION: No active cardiopulmonary disease. Electronically Signed   By: Vanetta Chou M.D.   On: 05/18/2024 14:46     Procedures   Medications Ordered in the ED  famotidine  (PEPCID ) tablet 20 mg (20 mg Oral Given 05/18/24 1450)    78 y.o. female presents to the ED with complaints of chest pain  since 6 AM the differential diagnosis includes ACS, GERD, pneumonia, PE, ICD malfunction (Ddx)  On arrival pt is nontoxic, vitals unremarkable. Exam unremarkable.  Additional history obtained from chart review significant for recent replacement of RV ICD without complication.  I ordered medication famotidine  requested by patient due to similar symptoms as previous episodes.  Lab Tests:  BMP CBC troponin ordered in triage  No significant abnormalities noted on lab work.  Delta troponin negative.  Imaging Studies ordered:  I ordered imaging studies which included chest x-ray ordered in triage no acute abnormality noted.  ED Course:   Patient sitting comfortably in ED bed in no acute distress nontoxic-appearing.  Patient and family advised taking famotidine  has helped with these incidents in the past but did not take it today due to expiration of bottle.  They are requesting famotidine  in ED.  Was advised with patient symptoms and history, cardiac workup will also be done.  Family and patient agree at bedside.  Dr. Lavona was consulted with cardiology who advised symptoms of ICD malfunction postsurgery would be more of a pericarditis picture and symptoms would not resolve with famotidine .  He advises if symptoms do persist to have her follow-up in the office for reevaluation.  Patient symptoms are still 0 out of 10  after administration of famotidine  and no new symptoms arise.  Patient has had similar episodes in the past which have resolved with famotidine .  Patient has no other reported associated symptoms including shortness of breath, visual changes, headache, dizziness.  Patient is not hypoxic or tachycardic.  PE was considered but low suspicion at this time.  Will discharge patient with famotidine  prescription and advised to follow-up with cardiology for persistent symptoms.  Advised patient of strict return precautions for ED return.  Patient agrees at time of discharge.   Portions of this note were generated with Scientist, clinical (histocompatibility and immunogenetics). Dictation errors may occur despite best attempts at proofreading.   Final diagnoses:  Other chest pain    ED Discharge Orders          Ordered    famotidine  (PEPCID ) 20 MG tablet  2 times daily        05/18/24 1637               Myriam Fonda RAMAN, [place] 05/18/24 1644  "

## 2024-05-18 NOTE — Discharge Instructions (Addendum)
 Imaging and lab work are reassuring today.  If you still have this persistent pain please follow-up with your cardiologist in office as soon as possible.  If chest pain worsens or you experience shortness of breath, visual changes, fainting, extreme weakness, or other concerning symptoms please return to the emergency department for further evaluation.

## 2024-05-21 ENCOUNTER — Encounter

## 2024-05-24 ENCOUNTER — Emergency Department (HOSPITAL_BASED_OUTPATIENT_CLINIC_OR_DEPARTMENT_OTHER)

## 2024-05-24 ENCOUNTER — Other Ambulatory Visit: Payer: Self-pay

## 2024-05-24 ENCOUNTER — Ambulatory Visit: Attending: Cardiology

## 2024-05-24 ENCOUNTER — Encounter (HOSPITAL_BASED_OUTPATIENT_CLINIC_OR_DEPARTMENT_OTHER): Payer: Self-pay

## 2024-05-24 DIAGNOSIS — I110 Hypertensive heart disease with heart failure: Secondary | ICD-10-CM | POA: Diagnosis not present

## 2024-05-24 DIAGNOSIS — I5022 Chronic systolic (congestive) heart failure: Secondary | ICD-10-CM | POA: Diagnosis not present

## 2024-05-24 DIAGNOSIS — Z95810 Presence of automatic (implantable) cardiac defibrillator: Secondary | ICD-10-CM | POA: Diagnosis not present

## 2024-05-24 DIAGNOSIS — R059 Cough, unspecified: Secondary | ICD-10-CM | POA: Insufficient documentation

## 2024-05-24 DIAGNOSIS — Z79899 Other long term (current) drug therapy: Secondary | ICD-10-CM | POA: Diagnosis not present

## 2024-05-24 DIAGNOSIS — I509 Heart failure, unspecified: Secondary | ICD-10-CM | POA: Insufficient documentation

## 2024-05-24 DIAGNOSIS — I2510 Atherosclerotic heart disease of native coronary artery without angina pectoris: Secondary | ICD-10-CM | POA: Diagnosis not present

## 2024-05-24 DIAGNOSIS — R0981 Nasal congestion: Secondary | ICD-10-CM | POA: Insufficient documentation

## 2024-05-24 DIAGNOSIS — R5383 Other fatigue: Secondary | ICD-10-CM | POA: Insufficient documentation

## 2024-05-24 LAB — CBC
HCT: 40.9 % (ref 36.0–46.0)
Hemoglobin: 14 g/dL (ref 12.0–15.0)
MCH: 33.8 pg (ref 26.0–34.0)
MCHC: 34.2 g/dL (ref 30.0–36.0)
MCV: 98.8 fL (ref 80.0–100.0)
Platelets: 146 K/uL — ABNORMAL LOW (ref 150–400)
RBC: 4.14 MIL/uL (ref 3.87–5.11)
RDW: 13 % (ref 11.5–15.5)
WBC: 7.8 K/uL (ref 4.0–10.5)
nRBC: 0 % (ref 0.0–0.2)

## 2024-05-24 LAB — BASIC METABOLIC PANEL WITH GFR
Anion gap: 12 (ref 5–15)
BUN: 13 mg/dL (ref 8–23)
CO2: 24 mmol/L (ref 22–32)
Calcium: 9 mg/dL (ref 8.9–10.3)
Chloride: 104 mmol/L (ref 98–111)
Creatinine, Ser: 0.9 mg/dL (ref 0.44–1.00)
GFR, Estimated: >60 mL/min
Glucose, Bld: 117 mg/dL — ABNORMAL HIGH (ref 70–99)
Potassium: 3.8 mmol/L (ref 3.5–5.1)
Sodium: 139 mmol/L (ref 135–145)

## 2024-05-24 LAB — TROPONIN T, HIGH SENSITIVITY: Troponin T High Sensitivity: <15 ng/L (ref 0–19)

## 2024-05-24 NOTE — ED Triage Notes (Signed)
 Mid chest pain, fatigue since this morning. Denies NV, SHOB, fevers

## 2024-05-24 NOTE — ED Notes (Signed)
 ED Provider at bedside.

## 2024-05-24 NOTE — ED Notes (Signed)
 Attempted to get IV access. Unable to get IV to thread

## 2024-05-24 NOTE — Discharge Instructions (Signed)
 The test today in the ED were reassuring.  Your blood count is normal.  Your kidney function is normal.  The x-ray did not show pneumonia.  Your heart enzyme test was normal.  Follow-up with your primary care doctor next week to be rechecked if your symptoms persist.

## 2024-05-24 NOTE — ED Provider Notes (Signed)
 " [place] EMERGENCY DE[facility] AT [facility] Provider Note   CSN: [id number] Arrival date & time: 05/24/24  1530     Patient presents with: Fatigue  Ann Bernard is a 79 y.o. female.    Chest Pain    Patient has history of hypertension CHF coronary artery disease hyperlipidemia.  Patient presents to the ED with complaints of fatigue.  Patient states she has had some mild URI type symptoms.  She has had a slight cough some mild nasal congestion.  That started a couple days ago.  Today however she started to feel very fatigued.  She denies having any chest pain.  She is not having shortness of breath.  She denies abdominal pain.  No vomiting or diarrhea.  No dysuria  Prior to Admission medications  Medication Sig Start Date End Date Taking? Authorizing Provider  alendronate (FOSAMAX) 70 MG tablet Take 70 mg by mouth once a week. Every Monday 04/26/19   [provider]  atorvastatin  (LIPITOR) 40 MG tablet Take 1 tablet (40 mg total) by mouth every evening. 01/15/24 01/14/25  Hayes Beckey CROME, NP  carvedilol  (COREG ) 12.5 MG tablet Take 1 tablet (12.5 mg total) by mouth 2 (two) times daily with a meal. 01/15/24   Hayes Beckey CROME, NP  cyclobenzaprine  (FLEXERIL ) 10 MG tablet Take 1 tablet (10 mg total) by mouth 2 (two) times daily as needed for muscle spasms. 05/31/22   Dreama Longs, MD  dapagliflozin  propanediol (FARXIGA ) 10 MG TABS tablet Take 1 tablet (10 mg total) by mouth daily before breakfast. 06/24/22   Rolan Ezra RAMAN, MD  diphenhydrAMINE (SOMINEX) 25 MG tablet Take 1 tablet by mouth daily as needed for itching.    [provider]  famotidine  (PEPCID ) 20 MG tablet Take 20 mg by mouth 2 (two) times daily.    [provider]  famotidine  (PEPCID ) 20 MG tablet Take 1 tablet (20 mg total) by mouth 2 (two) times daily. 05/18/24   Myriam Fonda RAMAN, PA-C  furosemide  (LASIX ) 20 MG tablet Take 1 tablet (20 mg total) by mouth daily. 12/09/23 07/04/24  Rolan Ezra RAMAN,  MD  Multiple Vitamins-Minerals (PRESERVISION AREDS 2 PO) Take 1 capsule by mouth daily.    [provider]  potassium chloride  SA (KLOR-CON  M) 20 MEQ tablet Take 1 tablet (20 mEq total) by mouth daily. 12/01/23   Rolan Ezra RAMAN, MD  sacubitril -valsartan  (ENTRESTO ) 24-26 MG Take 1 tablet by mouth 2 (two) times daily. 02/02/24   Rolan Ezra RAMAN, MD  spironolactone  (ALDACTONE ) 25 MG tablet Take 1 tablet (25 mg total) by mouth daily. 01/15/24   Hayes Beckey CROME, NP    Allergies: Aspirin     Review of Systems  Cardiovascular:  Positive for chest pain.    Updated Vital Signs BP 121/74 (BP Location: Right Arm)   Pulse 84   Temp 97.9 F (36.6 C)   Resp 20   SpO2 100%   Physical Exam Vitals and nursing note reviewed.  Constitutional:      General: She is not in acute distress.    Appearance: She is well-developed.  HENT:     Head: Normocephalic and atraumatic.     Right Ear: External ear normal.     Left Ear: External ear normal.  Eyes:     General: No scleral icterus.       Right eye: No discharge.        Left eye: No discharge.     Conjunctiva/sclera: Conjunctivae normal.  Neck:  Trachea: No tracheal deviation.  Cardiovascular:     Rate and Rhythm: Normal rate and regular rhythm.  Pulmonary:     Effort: Pulmonary effort is normal. No respiratory distress.     Breath sounds: Normal breath sounds. No stridor. No wheezing or rales.  Abdominal:     General: Bowel sounds are normal. There is no distension.     Palpations: Abdomen is soft.     Tenderness: There is no abdominal tenderness. There is no guarding or rebound.  Musculoskeletal:        General: No tenderness or deformity.     Cervical back: Neck supple.  Skin:    General: Skin is warm and dry.     Findings: No rash.  Neurological:     General: No focal deficit present.     Mental Status: She is alert.     Cranial Nerves: No cranial nerve deficit, dysarthria or facial asymmetry.     Sensory: No sensory  deficit.     Motor: No abnormal muscle tone or seizure activity.     Coordination: Coordination normal.  Psychiatric:        Mood and Affect: Mood normal.     (all labs ordered are listed, but only abnormal results are displayed) Labs Reviewed  BASIC METABOLIC PANEL WITH GFR - Abnormal; Notable for the following components:      Result Value   Glucose, Bld 117 (*)    All other components within normal limits  CBC - Abnormal; Notable for the following components:   Platelets 146 (*)    All other components within normal limits  TROPONIN T, HIGH SENSITIVITY    EKG: None  Radiology: DG Chest 2 View Result Date: 05/24/2024 EXAM: 2 VIEW(S) XRAY OF THE CHEST 05/24/2024 04:48:31 PM COMPARISON: 05/18/2024 CLINICAL HISTORY: chest pain FINDINGS: LINES, TUBES AND DEVICES: Left chest AICD in place. LUNGS AND PLEURA: No focal pulmonary opacity. No pleural effusion. No pneumothorax. HEART AND MEDIASTINUM: Aortic arch atherosclerosis. BONES AND SOFT TISSUES: Multilevel degenerative changes of thoracic spine. IMPRESSION: 1. No acute cardiopulmonary findings. 2. Left chest AICD in place. Electronically signed by: Elsie Gravely MD 05/24/2024 05:37 PM EST RP Workstation: [id number]     Procedures   Medications Ordered in the ED - No data to display  Clinical Course as of 05/24/24 1751  Mon May 24, 2024  1719 CBC(!) Normal [JK]  1719 Basic metabolic panel(!) Normal [JK]  1719 Troponin T, High Sensitivity Normal [JK]  1723 CBC(!) [JK]  1746 DG Chest 2 View Chest x-ray normal [JK]    Clinical Course User Index [JK] Randol Simmonds, MD                                 Medical Decision Making Problems Addressed: Other fatigue: acute illness or injury that poses a threat to life or bodily functions  Amount and/or Complexity of Data Reviewed Labs: ordered. Decision-making details documented in ED Course. Radiology: ordered and independent interpretation performed. Decision-making details  documented in ED Course.   Patient presented to the ED for evaluation of fatigue.  She denied any complaints of chest pain.  Patient did report some mild URI symptoms although currently denying any symptoms of cough or nasal congestion.  ED workup reassuring.  No signs of anemia.  No signs of dehydration or electrolyte abnormalities.  No evidence of acute heart injury.  X-ray does not show pneumonia.  Patient's vitals are  normal.  She is not hypotensive febrile or tachycardic.  She is not tachypneic and has a normal oxygen saturation.  Etiology of symptoms unclear.  It is possible she may have some type of viral illness.  Recommend outpatient follow-up with her PCP next week to be rechecked if symptoms have not resolved.     Final diagnoses:  Other fatigue    ED Discharge Orders     None          Randol Simmonds, MD 05/24/24 1752  "

## 2024-05-25 NOTE — Progress Notes (Signed)
 EPIC Encounter for ICM Monitoring  Patient Name: Ann Bernard is a 78 y.o. female Date: 05/25/2024 Primary Care Physican: Duwayne Katheryn HERO, PA Primary Cardiologist: Rolan Electrophysiologist: Inocencio Bi-V Pacing: <1%  08/15/2023 Office Weight:  160 lbs Does not weigh at home   AT/AF Burden 0%                                             Pt currently in ED.    Since 04/29/2024:  CorVue thoracic impedance suggesting possible dryness starting 05/20/2024.   Prescribed:  Furosemide  20 mg take 1 tablet(s) (20 mg total) by mouth daily. Potassium 20 mEq take 1 tablet(s) (20 mEq total) by mouth daily. Spironolactone  25 mg take 1 tablet by mouth daily   Labs: 05/24/2024 Creatinine 0.90, BUN 13, Potassium 3.8, Sodium 139, GFR >60  05/18/2024 Creatinine 0.86, BUN 12, Potassium 3.6, Sodium 143, GFR >60  04/05/2024 Creatinine 0.84, BUN 16, Potassium 4.5, Sodium 140  01/15/2024 Creatinine 0.85, BUN 16, Potassium 4.0, Sodium 140, GFR <60 12/19/2023 Creatinine 0.60, BUN 17, Potassium 3.9, Sodium 137, GFR >60 08/15/2023 Creatinine 0.79, BUN 10, Potassium 3.8, Sodium 140, GFR >60  06/25/2023 Creatinine 0.76, BUN 16, Potassium 4.2, Sodium 135, GFR >60  06/06/2023 Creatinine 0.91, BUN 15, Potassium 3.8, Sodium 137, GFR >60  06/04/2023 Creatinine 0.71, BUN 15, Potassium 3.8, Sodium 134, GFR >60  A complete set of results can be found in Results Review.   Recommendations:   No changes.    Follow-up plan: ICM clinic phone appointment on 06/07/2024 to recheck fluid levels.  Next 31 day ICM Remote Transmission due 06/24/2024.   91 day device clinic remote transmission 06/04/2024.     EP/Cardiology Office Visits:  Recall 02/20/2025 with EP APP.  Recall 05/14/2024 with Dr. Rolan.     Copy of ICM check sent to Dr. Inocencio.    Remote monitoring is medically necessary for Heart Failure Management.    Daily Thoracic Impedance ICM trend: 04/29/2024 through 05/24/2024.    12-14 Month Thoracic Impedance ICM trend:      [name], RN 05/25/2024 1:31 PM

## 2024-05-31 ENCOUNTER — Other Ambulatory Visit (HOSPITAL_COMMUNITY): Payer: Self-pay

## 2024-06-02 ENCOUNTER — Emergency Department (HOSPITAL_BASED_OUTPATIENT_CLINIC_OR_DEPARTMENT_OTHER)
Admission: EM | Admit: 2024-06-02 | Discharge: 2024-06-02 | Disposition: A | Discharge status: Home / Self Care | Attending: Emergency Medicine | Admitting: Emergency Medicine

## 2024-06-02 ENCOUNTER — Other Ambulatory Visit: Payer: Self-pay

## 2024-06-02 ENCOUNTER — Encounter (HOSPITAL_BASED_OUTPATIENT_CLINIC_OR_DEPARTMENT_OTHER): Payer: Self-pay

## 2024-06-02 ENCOUNTER — Emergency Department (HOSPITAL_BASED_OUTPATIENT_CLINIC_OR_DEPARTMENT_OTHER)

## 2024-06-02 DIAGNOSIS — Z950 Presence of cardiac pacemaker: Secondary | ICD-10-CM | POA: Insufficient documentation

## 2024-06-02 DIAGNOSIS — I10 Essential (primary) hypertension: Secondary | ICD-10-CM | POA: Diagnosis not present

## 2024-06-02 DIAGNOSIS — F17200 Nicotine dependence, unspecified, uncomplicated: Secondary | ICD-10-CM | POA: Insufficient documentation

## 2024-06-02 DIAGNOSIS — R079 Chest pain, unspecified: Secondary | ICD-10-CM | POA: Diagnosis present

## 2024-06-02 DIAGNOSIS — Z79899 Other long term (current) drug therapy: Secondary | ICD-10-CM | POA: Insufficient documentation

## 2024-06-02 LAB — TROPONIN T, HIGH SENSITIVITY
Troponin T High Sensitivity: <15 ng/L (ref 0–19)
Troponin T High Sensitivity: <15 ng/L (ref 0–19)

## 2024-06-02 LAB — CBC
HCT: 42.3 % (ref 36.0–46.0)
Hemoglobin: 14.4 g/dL (ref 12.0–15.0)
MCH: 33 pg (ref 26.0–34.0)
MCHC: 34 g/dL (ref 30.0–36.0)
MCV: 96.8 fL (ref 80.0–100.0)
Platelets: 186 K/uL (ref 150–400)
RBC: 4.37 MIL/uL (ref 3.87–5.11)
RDW: 12.5 % (ref 11.5–15.5)
WBC: 6.3 K/uL (ref 4.0–10.5)
nRBC: 0 % (ref 0.0–0.2)

## 2024-06-02 LAB — BASIC METABOLIC PANEL WITH GFR
Anion gap: 12 (ref 5–15)
BUN: 14 mg/dL (ref 8–23)
CO2: 24 mmol/L (ref 22–32)
Calcium: 9.3 mg/dL (ref 8.9–10.3)
Chloride: 103 mmol/L (ref 98–111)
Creatinine, Ser: 0.88 mg/dL (ref 0.44–1.00)
GFR, Estimated: >60 mL/min
Glucose, Bld: 110 mg/dL — ABNORMAL HIGH (ref 70–99)
Potassium: 4.3 mmol/L (ref 3.5–5.1)
Sodium: 138 mmol/L (ref 135–145)

## 2024-06-02 MED ORDER — LIDOCAINE VISCOUS HCL 2 % MT SOLN
15.0000 mL | Freq: Once | OROMUCOSAL | Status: AC
  Administered 2024-06-02: 15 mL via ORAL
  Filled 2024-06-02: qty 15

## 2024-06-02 MED ORDER — ALUM & MAG HYDROXIDE-SIMETH 200-200-20 MG/5ML PO SUSP
30.0000 mL | Freq: Once | ORAL | Status: AC
  Administered 2024-06-02: 30 mL via ORAL
  Filled 2024-06-02: qty 30

## 2024-06-02 NOTE — ED Provider Notes (Signed)
 " [place] [facility] Provider Note   CSN: [id number] Arrival date & time: 06/02/24  1528     Patient presents with: Chest Pain   Ann Bernard is a 79 y.o. female.   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  Started this morning.  Not exertional.  Not pleuritic.  No diaphoresis or vomiting.  Not worsened with eating.  Currently is 4/10 in severity.  No radiation.  Tried some Pepcid  prior to arrival and reports that that improved her symptoms.        Prior to Admission medications  Medication Sig Start Date End Date Taking? Authorizing Provider  alendronate (FOSAMAX) 70 MG tablet Take 70 mg by mouth once a week. Every Monday 04/26/19   [provider]  atorvastatin  (LIPITOR) 40 MG tablet Take 1 tablet (40 mg total) by mouth every evening. 01/15/24 01/14/25  Hayes Beckey CROME, NP  carvedilol  (COREG ) 12.5 MG tablet Take 1 tablet (12.5 mg total) by mouth 2 (two) times daily with a meal. 01/15/24   Hayes Beckey CROME, NP  cyclobenzaprine  (FLEXERIL ) 10 MG tablet Take 1 tablet (10 mg total) by mouth 2 (two) times daily as needed for muscle spasms. 05/31/22   Dreama Longs, MD  dapagliflozin  propanediol (FARXIGA ) 10 MG TABS tablet Take 1 tablet (10 mg total) by mouth daily before breakfast. 06/24/22   Rolan Ezra RAMAN, MD  diphenhydrAMINE (SOMINEX) 25 MG tablet Take 1 tablet by mouth daily as needed for itching.    [provider]  famotidine  (PEPCID ) 20 MG tablet Take 20 mg by mouth 2 (two) times daily.    [provider]  famotidine  (PEPCID ) 20 MG tablet Take 1 tablet (20 mg total) by mouth 2 (two) times daily. 05/18/24   Myriam Fonda RAMAN, PA-C  furosemide  (LASIX ) 20 MG tablet Take 1 tablet (20 mg total) by mouth daily. 12/09/23 07/04/24  Rolan Ezra RAMAN, MD  Multiple Vitamins-Minerals  (PRESERVISION AREDS 2 PO) Take 1 capsule by mouth daily.    [provider]  potassium chloride  SA (KLOR-CON  M) 20 MEQ tablet Take 1 tablet (20 mEq total) by mouth daily. 12/01/23   Rolan Ezra RAMAN, MD  sacubitril -valsartan  (ENTRESTO ) 24-26 MG Take 1 tablet by mouth 2 (two) times daily. 02/02/24   Rolan Ezra RAMAN, MD  spironolactone  (ALDACTONE ) 25 MG tablet Take 1 tablet (25 mg total) by mouth daily. 01/15/24   Hayes Beckey CROME, NP    Allergies: Aspirin     Review of Systems  Updated Vital Signs BP 126/75   Pulse (!) 59   Temp 98.1 F (36.7 C) (Oral)   Resp 18   Ht 5' 7 (1.702 m)   Wt 74.8 kg   SpO2 100%   BMI 25.84 kg/m   Physical Exam Vitals and nursing note reviewed.  Constitutional:      General: She is not in acute distress.    Appearance: She is well-developed.  HENT:     Head: Normocephalic and atraumatic.     Right Ear: External ear normal.     Left Ear: External ear normal.     Nose: Nose normal.  Eyes:     Extraocular Movements: Extraocular movements intact.     Conjunctiva/sclera: Conjunctivae normal.     Pupils: Pupils are equal, round, and reactive to light.  Cardiovascular:     Rate and Rhythm: Normal rate and regular rhythm.  Heart sounds: No murmur heard.    Comments: Chest pain not reproducible.  Radial pulses 2+ bilaterally. Pulmonary:     Effort: Pulmonary effort is normal. No respiratory distress.     Breath sounds: Normal breath sounds.  Musculoskeletal:     Cervical back: Normal range of motion and neck supple.     Right lower leg: No edema.     Left lower leg: No edema.  Skin:    General: Skin is warm and dry.  Neurological:     Mental Status: She is alert and oriented to person, place, and time. Mental status is at baseline.  Psychiatric:        Mood and Affect: Mood normal.     (all labs ordered are listed, but only abnormal results are displayed) Labs Reviewed  BASIC METABOLIC PANEL WITH GFR - Abnormal; Notable for the  following components:      Result Value   Glucose, Bld 110 (*)    All other components within normal limits  CBC  TROPONIN T, HIGH SENSITIVITY  TROPONIN T, HIGH SENSITIVITY    EKG: EKG Interpretation Date/Time:  Wednesday June 02 2024 15:39:53 EST Ventricular Rate:  70 PR Interval:  136 QRS Duration:  146 QT Interval:  429 QTC Calculation: 463 R Axis:   118  Text Interpretation: atrial sensed and ventricular paced rhythm Probable anterolateral infarct, old Minimal ST depression, inferior leads Confirmed by Ann Bernard [phone]) on 06/02/2024 6:23:57 PM  Radiology: DG Chest 2 View Result Date: 06/02/2024 CLINICAL DATA:  Chest pain beginning this morning. EXAM: CHEST - 2 VIEW COMPARISON:  05/24/2024 FINDINGS: Left-sided pacemaker unchanged. Lungs are adequately inflated and otherwise clear. Cardiomediastinal silhouette and remainder of the exam is unchanged. IMPRESSION: No active cardiopulmonary disease. Electronically Signed   By: Toribio Agreste M.D.   On: 06/02/2024 15:54     Procedures   Medications Ordered in the ED  alum & mag hydroxide-simeth (MAALOX/MYLANTA) 200-200-20 MG/5ML suspension 30 mL (30 mLs Oral Given 06/02/24 1836)    And  lidocaine  (XYLOCAINE ) 2 % viscous mouth solution 15 mL (15 mLs Oral Given 06/02/24 1836)                                    Medical Decision Making Amount and/or Complexity of Data Reviewed Labs: ordered. Radiology: ordered.  Risk OTC drugs. Prescription drug management.   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   Initial Ddx:  MI, PE, pneumonia, dissection, pericarditis, costochondritis, reflux  MDM:  With the patient's chest discomfort will obtain EKG and troponins to evaluate for MI.  Also considering pulmonary embolism but patient is not having any  respiratory symptoms so feel it is highly unlikely.  Considered dissection but with their symmetric pulses, history, and description of the pain feel it is less likely.  If chest x-ray reveals widened mediastinum or any other concerning findings will consider CTA.  Also considered pericarditis but description is unlikely and they do not have risk factors for this diagnosis.  Chest pain not reproducible so feel it costochondritis less likely.  No infectious symptoms to suggest pneumonia at this time that would be causing pleuritic chest pain. Does report that it improved with pepcid  so could be GI source  Plan:  Labs Troponin EKG Chest x-ray  ED Summary/Re-evaluation:  EKG  shows paced rhythm without acute ischemic changes but did show possible anterior infarct which is likely old.  High-sensitivity troponins undetectably low x 2.  Chest x-ray without acute findings.  Patient given GI cocktail and upon reevaluation was feeling better.  With her workup suspect that symptoms are likely due to GERD.  Does have cardiac history however her show will have her follow-up with cardiology  This patient presents to the ED for concern of complaints listed in HPI, this involves an extensive number of treatment options, and is a complaint that carries with it a high risk of complications and morbidity. Disposition including potential need for admission considered.   Dispo: DC Home. Return precautions discussed including, but not limited to, those listed in the AVS. Allowed pt time to ask questions which were answered fully prior to dc.  Additional history obtained from son Records reviewed Outpatient Clinic Notes The following labs were independently interpreted: Chemistry and show no acute abnormality I independently reviewed the following imaging with scope of interpretation limited to determining acute life threatening conditions related to emergency care: Chest x-ray and agree with the radiologist  interpretation with the following exceptions: none I personally reviewed and interpreted cardiac monitoring: normal sinus rhythm  I personally reviewed and interpreted the pt's EKG: see above for interpretation  I have reviewed the patients home medications and made adjustments as needed Social Determinants of health:  Geriatric    Final diagnoses:  Chest pain, unspecified type    ED Discharge Orders          Ordered    Ambulatory referral to Cardiology        06/02/24 1924               Ann Lamar BROCKS, MD 06/04/24 1019  "

## 2024-06-02 NOTE — ED Notes (Signed)
RN provided AVS using Teachback Method. Patient verbalizes understanding of Discharge Instructions. Opportunity for Questioning and Answers were provided by RN. Patient Discharged from ED ambulatory to home with family. ? ?

## 2024-06-02 NOTE — ED Triage Notes (Signed)
 Patient here POV from Home.  Endorses CP that began this AM. Constant, Center chest. No SOB. No nausea. No Dizziness. Some Fatigue.  NAD noted during triage. A&Ox4. Gcs 15. Ambulatory.

## 2024-06-02 NOTE — ED Notes (Signed)
 Pt. Reports she is not having chest pain at this time.  Pt. Is alert and has no complaints at this time.

## 2024-06-02 NOTE — Discharge Instructions (Addendum)
 You were seen for your chest pain in the emergency department.   At home, please continue taking your Pepcid  in case this is coming from reflux.    Follow-up with your primary doctor in 2-3 days regarding your visit.  Cardiology will be calling you regarding an appointment within the next 72 hours.  You may contact them if you do not hear from them in that time using the information in this packet.  Return immediately to the emergency department if you experience any of the following: Worsening pain, difficulty breathing, unexplained vomiting or sweating, or any other concerning symptoms.    Thank you for visiting our Emergency Department. It was a pleasure taking care of you today.

## 2024-06-04 ENCOUNTER — Ambulatory Visit: Attending: Cardiology

## 2024-06-04 ENCOUNTER — Ambulatory Visit: Payer: Self-pay | Admitting: Cardiology

## 2024-06-04 DIAGNOSIS — I428 Other cardiomyopathies: Secondary | ICD-10-CM | POA: Diagnosis not present

## 2024-06-04 LAB — CUP PACEART REMOTE DEVICE CHECK
Battery Remaining Longevity: 95 mo
Battery Remaining Percentage: 95.5 %
Battery Voltage: 3.11 V
Brady Statistic AP VP Percent: 17 %
Brady Statistic AP VS Percent: 1 %
Brady Statistic AS VP Percent: 82 %
Brady Statistic AS VS Percent: 1 %
Brady Statistic RA Percent Paced: 17 %
Date Time Interrogation Session: 20260116020408
HighPow Impedance: 63 Ohm
Lead Channel Impedance Value: 1275 Ohm
Lead Channel Impedance Value: 440 Ohm
Lead Channel Impedance Value: 540 Ohm
Lead Channel Pacing Threshold Amplitude: 0.5 V
Lead Channel Pacing Threshold Amplitude: 0.625 V
Lead Channel Pacing Threshold Amplitude: 1.875 V
Lead Channel Pacing Threshold Pulse Width: 0.5 ms
Lead Channel Pacing Threshold Pulse Width: 0.5 ms
Lead Channel Pacing Threshold Pulse Width: 0.9 ms
Lead Channel Sensing Intrinsic Amplitude: 12 mV
Lead Channel Sensing Intrinsic Amplitude: 3 mV
Lead Channel Setting Pacing Amplitude: 1.625
Lead Channel Setting Pacing Amplitude: 2 V
Lead Channel Setting Pacing Amplitude: 2.375
Lead Channel Setting Pacing Pulse Width: 0.5 ms
Lead Channel Setting Pacing Pulse Width: 0.9 ms
Lead Channel Setting Sensing Sensitivity: 0.5 mV
Pulse Gen Serial Number: 211065993
Zone Setting Status: 755011

## 2024-06-07 ENCOUNTER — Other Ambulatory Visit (HOSPITAL_COMMUNITY): Payer: Self-pay

## 2024-06-08 ENCOUNTER — Other Ambulatory Visit (HOSPITAL_COMMUNITY): Payer: Self-pay

## 2024-06-10 NOTE — Progress Notes (Signed)
 No ICM remote transmission received for 06/07/2024 and next ICM transmission scheduled for 06/28/2024.

## 2024-06-10 NOTE — Progress Notes (Signed)
 Remote ICD Transmission

## 2024-06-17 NOTE — Progress Notes (Signed)
 31 day ICM Remote transmission canceled due to [facility] clinic is on hold until further notice.  91 day remote monitoring will continue per protocol.

## 2024-06-25 ENCOUNTER — Telehealth (HOSPITAL_COMMUNITY): Payer: Self-pay

## 2024-06-25 NOTE — Telephone Encounter (Signed)
 [employer] FMLA forms have been faxed. Son called to inform him that forms have been sent

## 2024-06-28 ENCOUNTER — Ambulatory Visit

## 2024-08-02 ENCOUNTER — Ambulatory Visit: Admitting: Student

## 2024-08-20 ENCOUNTER — Encounter

## 2024-09-03 ENCOUNTER — Ambulatory Visit

## 2024-12-03 ENCOUNTER — Ambulatory Visit

## 2025-03-04 ENCOUNTER — Ambulatory Visit
